# Patient Record
Sex: Female | Born: 1970 | Race: Black or African American | Hispanic: No | Marital: Single | Smoking: Never smoker
Health system: Southern US, Community
[De-identification: ages and names within clinical notes are randomized; demographics above are authoritative.]

## PROBLEM LIST (undated history)

## (undated) DIAGNOSIS — F32A Depression, unspecified: Secondary | ICD-10-CM

## (undated) DIAGNOSIS — F4541 Pain disorder exclusively related to psychological factors: Secondary | ICD-10-CM

## (undated) DIAGNOSIS — F329 Major depressive disorder, single episode, unspecified: Secondary | ICD-10-CM

## (undated) DIAGNOSIS — R739 Hyperglycemia, unspecified: Secondary | ICD-10-CM

## (undated) DIAGNOSIS — M797 Fibromyalgia: Secondary | ICD-10-CM

## (undated) DIAGNOSIS — L708 Other acne: Secondary | ICD-10-CM

## (undated) DIAGNOSIS — E669 Obesity, unspecified: Secondary | ICD-10-CM

## (undated) DIAGNOSIS — E559 Vitamin D deficiency, unspecified: Secondary | ICD-10-CM

## (undated) DIAGNOSIS — R5383 Other fatigue: Secondary | ICD-10-CM

## (undated) DIAGNOSIS — M545 Low back pain: Secondary | ICD-10-CM

## (undated) DIAGNOSIS — R21 Rash and other nonspecific skin eruption: Secondary | ICD-10-CM

## (undated) DIAGNOSIS — R51 Headache: Secondary | ICD-10-CM

## (undated) DIAGNOSIS — M549 Dorsalgia, unspecified: Secondary | ICD-10-CM

## (undated) DIAGNOSIS — R102 Pelvic and perineal pain: Secondary | ICD-10-CM

## (undated) DIAGNOSIS — K2970 Gastritis, unspecified, without bleeding: Secondary | ICD-10-CM

## (undated) DIAGNOSIS — R5381 Other malaise: Secondary | ICD-10-CM

## (undated) DIAGNOSIS — L84 Corns and callosities: Secondary | ICD-10-CM

## (undated) HISTORY — DX: Gastritis, unspecified, without bleeding: K29.70

## (undated) HISTORY — DX: Other acne: L70.8

## (undated) HISTORY — DX: Other malaise: R53.81

## (undated) HISTORY — DX: Other fatigue: R53.83

## (undated) HISTORY — DX: Pain disorder exclusively related to psychological factors: F45.41

## (undated) HISTORY — DX: Vitamin D deficiency, unspecified: E55.9

## (undated) HISTORY — DX: Fibromyalgia: M79.7

## (undated) HISTORY — DX: Hyperglycemia, unspecified: R73.9

## (undated) HISTORY — DX: Major depressive disorder, single episode, unspecified: F32.9

## (undated) HISTORY — DX: Depression, unspecified: F32.A

## (undated) HISTORY — PX: BREAST BIOPSY: SHX20

## (undated) HISTORY — PX: FOOT SURGERY: SHX648

## (undated) HISTORY — DX: Rash and other nonspecific skin eruption: R21

## (undated) HISTORY — PX: APPENDECTOMY: SHX54

## (undated) HISTORY — PX: THYROIDECTOMY, PARTIAL: SHX18

## (undated) HISTORY — DX: Low back pain: M54.5

## (undated) HISTORY — DX: Corns and callosities: L84

## (undated) HISTORY — DX: Headache: R51

## (undated) HISTORY — DX: Pelvic and perineal pain: R10.2

## (undated) HISTORY — DX: Dorsalgia, unspecified: M54.9

## (undated) HISTORY — DX: Obesity, unspecified: E66.9

---

## 2007-05-04 ENCOUNTER — Emergency Department (HOSPITAL_COMMUNITY): Admission: EM | Admit: 2007-05-04 | Discharge: 2007-05-04 | Payer: Self-pay | Admitting: Emergency Medicine

## 2007-05-18 ENCOUNTER — Encounter (INDEPENDENT_AMBULATORY_CARE_PROVIDER_SITE_OTHER): Payer: Self-pay | Admitting: Nurse Practitioner

## 2007-05-18 LAB — CONVERTED CEMR LAB
Bilirubin Urine: NEGATIVE
Chlamydia, DNA Probe: NEGATIVE
HCT: 36.7 %
Hemoglobin: 12.3 g/dL
MCHC: 33.6 g/dL
MCV: 87.3 fL
Protein, ur: NEGATIVE mg/dL
RDW: 13.8 %
pH: 5

## 2007-05-19 ENCOUNTER — Encounter (INDEPENDENT_AMBULATORY_CARE_PROVIDER_SITE_OTHER): Payer: Self-pay | Admitting: Nurse Practitioner

## 2007-05-19 LAB — CONVERTED CEMR LAB
BUN: 14 mg/dL
CO2: 26 meq/L
Calcium: 9 mg/dL
Chloride: 105 meq/L
Creatinine, Ser: 0.48 mg/dL
Sodium: 141 meq/L

## 2007-05-30 ENCOUNTER — Emergency Department (HOSPITAL_COMMUNITY): Admission: EM | Admit: 2007-05-30 | Discharge: 2007-05-30 | Payer: Self-pay | Admitting: Family Medicine

## 2007-05-30 DIAGNOSIS — Z8719 Personal history of other diseases of the digestive system: Secondary | ICD-10-CM | POA: Insufficient documentation

## 2007-07-18 ENCOUNTER — Ambulatory Visit: Payer: Self-pay | Admitting: Nurse Practitioner

## 2007-07-18 DIAGNOSIS — M545 Low back pain, unspecified: Secondary | ICD-10-CM | POA: Insufficient documentation

## 2007-07-18 HISTORY — DX: Low back pain, unspecified: M54.50

## 2007-07-18 LAB — CONVERTED CEMR LAB
Bilirubin Urine: NEGATIVE
Blood in Urine, dipstick: NEGATIVE
KOH Prep: NEGATIVE
pH: 8

## 2007-07-19 ENCOUNTER — Ambulatory Visit (HOSPITAL_COMMUNITY): Admission: RE | Admit: 2007-07-19 | Discharge: 2007-07-19 | Payer: Self-pay | Admitting: Nurse Practitioner

## 2007-07-20 ENCOUNTER — Encounter (INDEPENDENT_AMBULATORY_CARE_PROVIDER_SITE_OTHER): Payer: Self-pay | Admitting: Nurse Practitioner

## 2007-07-26 ENCOUNTER — Encounter (INDEPENDENT_AMBULATORY_CARE_PROVIDER_SITE_OTHER): Payer: Self-pay | Admitting: Nurse Practitioner

## 2007-08-08 ENCOUNTER — Ambulatory Visit: Payer: Self-pay | Admitting: Nurse Practitioner

## 2007-08-08 ENCOUNTER — Ambulatory Visit (HOSPITAL_COMMUNITY): Admission: RE | Admit: 2007-08-08 | Discharge: 2007-08-08 | Payer: Self-pay | Admitting: Nurse Practitioner

## 2007-08-08 DIAGNOSIS — L84 Corns and callosities: Secondary | ICD-10-CM

## 2007-08-08 DIAGNOSIS — M79609 Pain in unspecified limb: Secondary | ICD-10-CM | POA: Insufficient documentation

## 2007-08-08 HISTORY — DX: Corns and callosities: L84

## 2007-08-22 ENCOUNTER — Ambulatory Visit: Payer: Self-pay | Admitting: Nurse Practitioner

## 2007-08-22 DIAGNOSIS — R51 Headache: Secondary | ICD-10-CM

## 2007-08-22 DIAGNOSIS — M7731 Calcaneal spur, right foot: Secondary | ICD-10-CM | POA: Insufficient documentation

## 2007-08-22 DIAGNOSIS — R519 Headache, unspecified: Secondary | ICD-10-CM | POA: Insufficient documentation

## 2007-08-22 DIAGNOSIS — N644 Mastodynia: Secondary | ICD-10-CM

## 2007-08-22 HISTORY — DX: Headache: R51

## 2007-08-22 LAB — CONVERTED CEMR LAB
AST: 14 units/L (ref 0–37)
BUN: 9 mg/dL (ref 6–23)
Basophils Absolute: 0 10*3/uL (ref 0.0–0.1)
Basophils Relative: 0 % (ref 0–1)
Bilirubin Urine: NEGATIVE
Blood in Urine, dipstick: NEGATIVE
CO2: 25 meq/L (ref 19–32)
Glucose, Urine, Semiquant: NEGATIVE
Ketones, urine, test strip: NEGATIVE
Lymphs Abs: 1.5 10*3/uL (ref 0.7–4.0)
MCHC: 30.8 g/dL (ref 30.0–36.0)
MCV: 90.9 fL (ref 78.0–100.0)
Monocytes Relative: 5 % (ref 3–12)
Neutro Abs: 2.8 10*3/uL (ref 1.7–7.7)
Neutrophils Relative %: 60 % (ref 43–77)
Platelets: 328 10*3/uL (ref 150–400)
Potassium: 4.7 meq/L (ref 3.5–5.3)

## 2007-09-15 ENCOUNTER — Encounter: Admission: RE | Admit: 2007-09-15 | Discharge: 2007-11-14 | Payer: Self-pay | Admitting: Nurse Practitioner

## 2007-09-21 ENCOUNTER — Ambulatory Visit: Payer: Self-pay | Admitting: Nurse Practitioner

## 2007-09-21 DIAGNOSIS — R21 Rash and other nonspecific skin eruption: Secondary | ICD-10-CM

## 2007-09-21 HISTORY — DX: Rash and other nonspecific skin eruption: R21

## 2007-09-22 ENCOUNTER — Encounter (INDEPENDENT_AMBULATORY_CARE_PROVIDER_SITE_OTHER): Payer: Self-pay | Admitting: Nurse Practitioner

## 2007-09-27 ENCOUNTER — Encounter (INDEPENDENT_AMBULATORY_CARE_PROVIDER_SITE_OTHER): Payer: Self-pay | Admitting: Nurse Practitioner

## 2007-10-03 ENCOUNTER — Encounter (INDEPENDENT_AMBULATORY_CARE_PROVIDER_SITE_OTHER): Payer: Self-pay | Admitting: Nurse Practitioner

## 2007-10-14 ENCOUNTER — Encounter (INDEPENDENT_AMBULATORY_CARE_PROVIDER_SITE_OTHER): Payer: Self-pay | Admitting: Nurse Practitioner

## 2007-11-01 ENCOUNTER — Ambulatory Visit: Payer: Self-pay | Admitting: Nurse Practitioner

## 2007-11-07 ENCOUNTER — Encounter: Admission: RE | Admit: 2007-11-07 | Discharge: 2007-11-07 | Payer: Self-pay | Admitting: Family Medicine

## 2007-11-10 ENCOUNTER — Encounter (INDEPENDENT_AMBULATORY_CARE_PROVIDER_SITE_OTHER): Payer: Self-pay | Admitting: Nurse Practitioner

## 2007-11-10 ENCOUNTER — Encounter: Admission: RE | Admit: 2007-11-10 | Discharge: 2007-11-10 | Payer: Self-pay | Admitting: Family Medicine

## 2007-11-10 ENCOUNTER — Encounter (INDEPENDENT_AMBULATORY_CARE_PROVIDER_SITE_OTHER): Payer: Self-pay | Admitting: Diagnostic Radiology

## 2007-11-15 ENCOUNTER — Encounter (INDEPENDENT_AMBULATORY_CARE_PROVIDER_SITE_OTHER): Payer: Self-pay | Admitting: Nurse Practitioner

## 2007-11-22 ENCOUNTER — Telehealth (INDEPENDENT_AMBULATORY_CARE_PROVIDER_SITE_OTHER): Payer: Self-pay | Admitting: Nurse Practitioner

## 2007-11-23 ENCOUNTER — Encounter (INDEPENDENT_AMBULATORY_CARE_PROVIDER_SITE_OTHER): Payer: Self-pay | Admitting: Nurse Practitioner

## 2007-11-24 ENCOUNTER — Encounter (INDEPENDENT_AMBULATORY_CARE_PROVIDER_SITE_OTHER): Payer: Self-pay | Admitting: Nurse Practitioner

## 2008-01-11 ENCOUNTER — Encounter (INDEPENDENT_AMBULATORY_CARE_PROVIDER_SITE_OTHER): Payer: Self-pay | Admitting: Nurse Practitioner

## 2008-02-20 ENCOUNTER — Emergency Department (HOSPITAL_COMMUNITY): Admission: EM | Admit: 2008-02-20 | Discharge: 2008-02-20 | Payer: Self-pay | Admitting: Emergency Medicine

## 2008-03-13 ENCOUNTER — Emergency Department (HOSPITAL_COMMUNITY): Admission: EM | Admit: 2008-03-13 | Discharge: 2008-03-13 | Payer: Self-pay | Admitting: Family Medicine

## 2008-04-18 ENCOUNTER — Ambulatory Visit: Payer: Self-pay | Admitting: Nurse Practitioner

## 2008-04-18 DIAGNOSIS — L708 Other acne: Secondary | ICD-10-CM

## 2008-04-18 DIAGNOSIS — R5381 Other malaise: Secondary | ICD-10-CM

## 2008-04-18 DIAGNOSIS — R5383 Other fatigue: Secondary | ICD-10-CM

## 2008-04-18 HISTORY — DX: Other acne: L70.8

## 2008-04-18 HISTORY — DX: Other malaise: R53.81

## 2008-04-19 ENCOUNTER — Emergency Department (HOSPITAL_COMMUNITY): Admission: EM | Admit: 2008-04-19 | Discharge: 2008-04-20 | Payer: Self-pay | Admitting: Emergency Medicine

## 2008-04-19 DIAGNOSIS — D649 Anemia, unspecified: Secondary | ICD-10-CM

## 2008-04-19 LAB — CONVERTED CEMR LAB
ALT: 13 units/L (ref 0–35)
AST: 16 units/L (ref 0–37)
Albumin: 4.1 g/dL (ref 3.5–5.2)
BUN: 9 mg/dL (ref 6–23)
Basophils Relative: 0 % (ref 0–1)
Eosinophils Absolute: 0.1 10*3/uL (ref 0.0–0.7)
Eosinophils Relative: 1 % (ref 0–5)
HCT: 35.9 % — ABNORMAL LOW (ref 36.0–46.0)
Lymphs Abs: 1.5 10*3/uL (ref 0.7–4.0)
MCHC: 32.9 g/dL (ref 30.0–36.0)
MCV: 87.3 fL (ref 78.0–100.0)
Neutrophils Relative %: 60 % (ref 43–77)
Platelets: 282 10*3/uL (ref 150–400)
Sodium: 142 meq/L (ref 135–145)
Total Protein: 7.1 g/dL (ref 6.0–8.3)
Vit D, 25-Hydroxy: 28 ng/mL — ABNORMAL LOW (ref 30–89)
WBC: 4.8 10*3/uL (ref 4.0–10.5)

## 2008-04-30 ENCOUNTER — Encounter (INDEPENDENT_AMBULATORY_CARE_PROVIDER_SITE_OTHER): Payer: Self-pay | Admitting: Nurse Practitioner

## 2008-06-07 ENCOUNTER — Ambulatory Visit: Payer: Self-pay | Admitting: Nurse Practitioner

## 2008-06-07 DIAGNOSIS — E669 Obesity, unspecified: Secondary | ICD-10-CM

## 2008-06-11 LAB — CONVERTED CEMR LAB
Basophils Absolute: 0 10*3/uL (ref 0.0–0.1)
Basophils Relative: 0 % (ref 0–1)
Eosinophils Absolute: 0.1 10*3/uL (ref 0.0–0.7)
Eosinophils Relative: 2 % (ref 0–5)
MCHC: 32.8 g/dL (ref 30.0–36.0)
Monocytes Absolute: 0.4 10*3/uL (ref 0.1–1.0)
Platelets: 269 10*3/uL (ref 150–400)
RBC: 3.92 M/uL (ref 3.87–5.11)
WBC: 5.2 10*3/uL (ref 4.0–10.5)

## 2008-07-19 ENCOUNTER — Encounter: Admission: RE | Admit: 2008-07-19 | Discharge: 2008-08-31 | Payer: Self-pay | Admitting: Orthopedic Surgery

## 2008-10-01 ENCOUNTER — Ambulatory Visit: Payer: Self-pay | Admitting: Nurse Practitioner

## 2008-10-01 ENCOUNTER — Encounter (INDEPENDENT_AMBULATORY_CARE_PROVIDER_SITE_OTHER): Payer: Self-pay | Admitting: Nurse Practitioner

## 2008-10-01 ENCOUNTER — Other Ambulatory Visit: Admission: RE | Admit: 2008-10-01 | Discharge: 2008-10-01 | Payer: Self-pay | Admitting: Internal Medicine

## 2008-10-01 LAB — CONVERTED CEMR LAB
Blood in Urine, dipstick: NEGATIVE
Chlamydia, DNA Probe: NEGATIVE
GC Probe Amp, Genital: NEGATIVE
Glucose, Urine, Semiquant: NEGATIVE
Ketones, urine, test strip: NEGATIVE
Specific Gravity, Urine: <1.005
Urobilinogen, UA: 0.2

## 2008-10-03 ENCOUNTER — Ambulatory Visit: Payer: Self-pay | Admitting: Nurse Practitioner

## 2008-10-03 LAB — CONVERTED CEMR LAB
TSH: 1.47 microintl units/mL (ref 0.350–4.500)
Total CHOL/HDL Ratio: 4
Triglycerides: 144 mg/dL (ref <150)
VLDL: 29 mg/dL (ref 0–40)

## 2008-10-04 ENCOUNTER — Encounter (INDEPENDENT_AMBULATORY_CARE_PROVIDER_SITE_OTHER): Payer: Self-pay | Admitting: Nurse Practitioner

## 2008-11-06 ENCOUNTER — Encounter (INDEPENDENT_AMBULATORY_CARE_PROVIDER_SITE_OTHER): Payer: Self-pay | Admitting: Nurse Practitioner

## 2008-11-08 ENCOUNTER — Encounter (INDEPENDENT_AMBULATORY_CARE_PROVIDER_SITE_OTHER): Payer: Self-pay | Admitting: Nurse Practitioner

## 2008-12-10 ENCOUNTER — Ambulatory Visit: Payer: Self-pay | Admitting: Nurse Practitioner

## 2009-02-14 ENCOUNTER — Ambulatory Visit: Payer: Self-pay | Admitting: Nurse Practitioner

## 2009-02-15 DIAGNOSIS — E559 Vitamin D deficiency, unspecified: Secondary | ICD-10-CM

## 2009-02-15 LAB — CONVERTED CEMR LAB
HCT: 37.4 % (ref 36.0–46.0)
Retic Ct Pct: 0.7 % (ref 0.4–3.1)

## 2009-03-11 ENCOUNTER — Emergency Department (HOSPITAL_COMMUNITY): Admission: EM | Admit: 2009-03-11 | Discharge: 2009-03-11 | Payer: Self-pay | Admitting: Emergency Medicine

## 2009-04-10 ENCOUNTER — Encounter: Admission: RE | Admit: 2009-04-10 | Discharge: 2009-05-16 | Payer: Self-pay | Admitting: Nurse Practitioner

## 2009-04-12 ENCOUNTER — Encounter (INDEPENDENT_AMBULATORY_CARE_PROVIDER_SITE_OTHER): Payer: Self-pay | Admitting: Nurse Practitioner

## 2009-04-15 ENCOUNTER — Encounter (INDEPENDENT_AMBULATORY_CARE_PROVIDER_SITE_OTHER): Payer: Self-pay | Admitting: Nurse Practitioner

## 2009-04-26 ENCOUNTER — Ambulatory Visit: Payer: Self-pay | Admitting: Nurse Practitioner

## 2009-05-01 LAB — CONVERTED CEMR LAB: Vit D, 25-Hydroxy: 20 ng/mL — ABNORMAL LOW (ref 30–89)

## 2009-10-02 ENCOUNTER — Telehealth (INDEPENDENT_AMBULATORY_CARE_PROVIDER_SITE_OTHER): Payer: Self-pay | Admitting: Nurse Practitioner

## 2009-10-02 ENCOUNTER — Encounter (INDEPENDENT_AMBULATORY_CARE_PROVIDER_SITE_OTHER): Payer: Self-pay | Admitting: Nurse Practitioner

## 2009-10-04 ENCOUNTER — Encounter: Admission: RE | Admit: 2009-10-04 | Discharge: 2009-10-04 | Payer: Self-pay | Admitting: Family Medicine

## 2009-10-04 ENCOUNTER — Encounter (INDEPENDENT_AMBULATORY_CARE_PROVIDER_SITE_OTHER): Payer: Self-pay | Admitting: Nurse Practitioner

## 2009-10-08 ENCOUNTER — Encounter (INDEPENDENT_AMBULATORY_CARE_PROVIDER_SITE_OTHER): Payer: Self-pay | Admitting: Nurse Practitioner

## 2010-01-06 ENCOUNTER — Encounter
Admission: RE | Admit: 2010-01-06 | Discharge: 2010-01-06 | Payer: Self-pay | Source: Home / Self Care | Attending: Neurology | Admitting: Neurology

## 2010-02-09 ENCOUNTER — Encounter: Payer: Self-pay | Admitting: Family Medicine

## 2010-02-18 NOTE — Miscellaneous (Signed)
Summary: Rehab Report//INITIAL SUMMARY  Rehab Report//INITIAL SUMMARY   Imported By: Arta Bruce 06/18/2009 15:28:45  _____________________________________________________________________  External Attachment:    Type:   Image     Comment:   External Document

## 2010-02-18 NOTE — Miscellaneous (Signed)
Summary: Mammogram Results  Clinical Lists Changes  Observations: Added new observation of MAMMO DUE: 10/2010 (10/08/2009 13:06) Added new observation of MAMMRECACT: Screening mammogram in 1 year.    (10/04/2009 13:07) Added new observation of MAMMOGRAM: No specific mammographic evidence of malignancy.  Assessment: BIRADS 1.  (10/04/2009 13:07)      Mammogram  Procedure date:  10/04/2009  Findings:      No specific mammographic evidence of malignancy.  Assessment: BIRADS 1.   Comments:      Screening mammogram in 1 year.     Procedures Next Due Date:    Mammogram: 10/2010   Mammogram  Procedure date:  10/04/2009  Findings:      No specific mammographic evidence of malignancy.  Assessment: BIRADS 1.   Comments:      Screening mammogram in 1 year.     Procedures Next Due Date:    Mammogram: 10/2010

## 2010-02-18 NOTE — Letter (Signed)
Summary: TEST ORDER FORM//MAMMOGRAM//APPT DATE & TIME  TEST ORDER FORM//MAMMOGRAM//APPT DATE & TIME   Imported By: Arta Bruce 10/03/2009 10:00:24  _____________________________________________________________________  External Attachment:    Type:   Image     Comment:   External Document

## 2010-02-18 NOTE — Assessment & Plan Note (Signed)
Summary: Back pain/Fatigue   Vital Signs:  Patient profile:   40 year old female Weight:      187.1 pounds Temp:     98.2 degrees F oral Pulse rate:   84 / minute Pulse rhythm:   regular Resp:     18 per minute BP sitting:   128 / 65  (left arm) Cuff size:   regular  Vitals Entered By: Geanie Cooley  (February 14, 2009 2:51 PM) CC: Pt states that she has been having  lower back pain and pt states she has been tired alot. Pt states that even when she wakes up from a nights sleep, she is still tired. Pt states  she has a headache allday it bere goes away., Back Pain Is Patient Diabetic? No Pain Assessment Patient in pain? yes     Location: back Intensity: 10 Type: sharp  Does patient need assistance? Functional Status Self care Ambulation Normal   CC:  Pt states that she has been having  lower back pain and pt states she has been tired alot. Pt states that even when she wakes up from a nights sleep, she is still tired. Pt states  she has a headache allday it bere goes away., and Back Pain.  History of Present Illness:  Pt into the office with complaints of headaches. Most importantly is that she feels fatigue all the time. Gets adequate sleep at night.  When she wakes during the day she feels tired.   Social - Pt has 4 children. She reports that 1 of them is special needs. Pt is a single parent  Back Pain History:      The pain is located in the lower back region and does not radiate below the knees.  She states that she has had a prior history of back pain.  The patient has had a recent course of physical therapy.        Other comments:  Low back pain - recurrent. Unable to stand for extended periods of time.  When cooking or cleaning in the house she has about a 10 minutes maximum time limit then she has to sit. Physical therapy was helpful to pt but ended about 3 months.     Allergies (verified): No Known Drug Allergies  Review of Systems CV:  Denies chest pain  or discomfort. Resp:  Denies cough. GI:  Denies abdominal pain, nausea, and vomiting.  Physical Exam  General:  alert.   Head:  normocephalic.   Lungs:  normal breath sounds.   Heart:  normal rate and regular rhythm.   Abdomen:  soft, non-tender, and normal bowel sounds.   Msk:  up to the exam table Neurologic:  alert & oriented X3.   Psych:  Oriented X3.     Impression & Recommendations:  Problem # 1:  FATIGUE (ICD-780.79) will check labs today Orders: T-Vitamin D (25-Hydroxy) ([id number])  Problem # 2:  LUMBAGO (ICD-724.2) advised pt that she will need to start water aerobics back pain is never going to completely resolve. pt will just have to tolerate it and use conservative measures Her updated medication list for this problem includes:    Arthrotec 75 75-200 Mg-mcg Tabs (Diclofenac-misoprostol) ..... One tablet by mouth two times a day  Complete Medication List: 1)  Triamcinolone Acetonide 0.1 % Lotn (Triamcinolone acetonide) .... Apply to body topically twice daily 2)  Celexa 20 Mg Tabs (Citalopram hydrobromide) .... One tablet by mouth daily 3)  Multivitamins Caps (Multiple vitamin) .Marland KitchenMarland KitchenMarland Kitchen  One tablet by mouth daily 4)  Ferrous Sulfate 325 (65 Fe) Mg Tbec (Ferrous sulfate) .Marland Kitchen.. 1 tablet by mouth daily 5)  Benzamycin 5-3 % Gel (Benzoyl peroxide-erythromycin) .... Apply to face two times a day 6)  Arthrotec 75 75-200 Mg-mcg Tabs (Diclofenac-misoprostol) .... One tablet by mouth two times a day 7)  Gabapentin 300 Mg Caps (Gabapentin) .... One capsule by mouth nightly for numbness  Other Orders: T- * Misc. Laboratory test [phone])  Patient Instructions: 1)  Check into joining the [employer] to do water aerobics. 2)  This will help you back. 3)  Also you should get a heating pad.  Buy from [employer] to use at night when back is hurting 4)  Labs will be done to find out why you are so tired. 5)  You will be informed of the results Prescriptions: ARTHROTEC 75 75-200 MG-MCG TABS  (DICLOFENAC-MISOPROSTOL) One tablet by mouth two times a day  #60 x 3   Entered and Authorized by:   Lehman Prom FNP   Signed by:   Lehman Prom FNP on 02/14/2009   Method used:   Print then Give to Patient   RxID:   [id number]

## 2010-02-18 NOTE — Miscellaneous (Signed)
Summary: Rehab Report/DISCHARGE SUMMARY  Rehab Report/DISCHARGE SUMMARY   Imported By: Arta Bruce 07/19/2009 15:41:46  _____________________________________________________________________  External Attachment:    Type:   Image     Comment:   External Document

## 2010-02-18 NOTE — Progress Notes (Signed)
Summary: need breast referral  Phone Note Call from Patient Call back at Home Phone [phone]   Reason for Call: Referral Summary of Call: Kristin Joseph. MS Sami CALLED AND SAYS THAT SHE HAS AN APPOINTMENT AT [facility] ON [address] TOMORROW AT 11. THE REASON WHY SHE IS GOING, IS BECAUSE SHE SAYS SHE WENT THERE SOME TIME AGO FOR THEM TO REMOVE SOMETING FRO HER LEFT BREAST AND NOW IT HAS COME BACK AND SHE IS HAVING A LOT OF PAIN IN THE SAME BREAST. THE [facility] IS [facility]KSING IF [facility] WILL SEND OVER A REFERRAL TO THERE OFFICE BEFORE HER APPT. TOMORROW Initial call taken by: Leodis Rains,  October 02, 2009 10:45 AM  Follow-up for Phone Call        LEFT MESSAGE FOR Joseph TO RETURN CALL ASAP> MAY NEED EVALUATION IN OFFICE? Chantel Miller  October 02, 2009 12:44 PM   spoke with Joseph and she is aware that order will be printed and [facility]ed to [facility] her appt is at 11:00 10/03/09 Follow-up by: Levon Hedger,  October 02, 2009 4:13 PM  Additional Follow-up for Phone Call Additional follow up Details #1::        Rx printed and at Nurse's station fax as indicated above Additional Follow-up by: Lehman Prom FNP,  October 02, 2009 5:07 PM

## 2010-03-03 ENCOUNTER — Encounter: Payer: Self-pay | Admitting: Nurse Practitioner

## 2010-03-03 ENCOUNTER — Encounter (INDEPENDENT_AMBULATORY_CARE_PROVIDER_SITE_OTHER): Payer: Self-pay | Admitting: Nurse Practitioner

## 2010-03-04 ENCOUNTER — Encounter (INDEPENDENT_AMBULATORY_CARE_PROVIDER_SITE_OTHER): Payer: Self-pay | Admitting: *Deleted

## 2010-03-12 NOTE — Assessment & Plan Note (Signed)
Summary: Breast Tenderness   Vital Signs:  Patient profile:   40 year old female Height:      66 inches Weight:      193.1 pounds BMI:     31.28 Temp:     96.8 degrees F oral Pulse rhythm:   regular Resp:     18 per minute BP sitting:   106 / 60  (left arm) Cuff size:   regular  Nutrition Counseling: Patient's BMI is greater than 25 and therefore counseled on weight management options. CC: pt has swelling in breast......Kristin Joseph pt says she has pain in left foot and back..., Back Pain Is Patient Diabetic? No Pain Assessment Patient in pain? no       Does patient need assistance? Functional Status Self care Ambulation Normal   CC:  pt has swelling in breast......Kristin Joseph pt says she has pain in left foot and back... and Back Pain.  History of Present Illness:  Pt into the office for breast tenderness Mammogram last done 09/2009 pt with complaints of breast tenderness.  She is worried because the breast are tender the majority of the time.   One cup of tea per day No smoking  reports regular menses - monthly  Back Pain History:      The patient's back pain has been present for > 6 weeks.  She states this is not work related.  She states that she has no prior history of back pain.  The patient has had a recent course of physical therapy.        Other comments:  No trauma to the back.    Critical Exclusionary Diagnosis Criteria (CEDC) for Back Pain:      The patient denies a history of previous trauma.  She has no prior history of spinal surgery.  There are no symptoms to suggest infection, cancer, cauda equina, or psychosocial factors for back pain.  Other positive CEDC factors include low back pain worse with activity.     Allergies: No Known Drug Allergies  Review of Systems General:  +breast tenderness. CV:  Denies chest pain or discomfort. Resp:  Denies cough. GI:  Denies abdominal pain, nausea, and vomiting. MS:  Complains of low back pain and stiffness; Surgery on left  foot in 2010 - still problematic.  Still with pain and swelling in left foot.  Needs f/u for the problems - previously seen by Dr. Ralene Cork.  Physical Exam  General:  alert.   Head:  normocephalic.   Msk:  left foot - visible incision scars at 3rd, 4th metatarsal Neurologic:  alert & oriented X3.     Detailed Back/Spine Exam  General:    obese.    Lumbosacral Exam:  Inspection-deformity:    Normal Palpation-spinal tenderness:  Abnormal    Location:  L4-L5   Impression & Recommendations:  Problem # 1:  BREAST PAIN, BILATERAL (ICD-611.71) mammogram up to date advised pt this is likely hormonal will order anti-inflammatories  avoid caffiene  Problem # 2:  NEED PROPHYLACTIC VACCINATION&INOCULATION FLU (ICD-V04.81) given today in office  Problem # 3:  LUMBAGO (ICD-724.2) pt would like referral to chiropractor will honor referral and give to pt as she has already picked out specialist  Her updated medication list for this problem includes:    Cataflam 50 Mg Tabs (Diclofenac potassium) ..... One tablet by mouth two times a day as needed for pain  Orders: Chiropractic Referral (Chiro)  Complete Medication List: 1)  Cataflam 50 Mg Tabs (Diclofenac potassium) .Kristin KitchenMarland KitchenMarland Joseph  One tablet by mouth two times a day as needed for pain  Other Orders: Flu Vaccine 83yrs + (16109) Admin 1st Vaccine (60454) Podiatry Referral (Podiatry)  Patient Instructions: 1)  Schedule an appointment for fasting labs lipids (278.00), CMET, 780.79, CBC (285.90), TSH (780.79) 2)  You can take the referral to the chiropractor 3)  Breast tenderness - this is likely hormonal.  Your mammogram was ok done September 2011.  Take medications two times a day with food for breast.  This will improve I just don't have an exact date/time that it will improve 4)  Foot pain - you will be referred back to [facility] - you will be informed of the time/date of the appointment 5)  Follow up as  needed Prescriptions: CATAFLAM 50 MG TABS (DICLOFENAC POTASSIUM) One tablet by mouth two times a day as needed for pain  #60 x 1   Entered and Authorized by:   Lehman Prom FNP   Signed by:   Lehman Prom FNP on 03/03/2010   Method used:   Print then Give to Patient   RxID:   [id number]    Orders Added: 1)  Flu Vaccine 31yrs + [30865] 2)  Admin 1st Vaccine [90471] 3)  Est. Patient Level III [78469] 4)  Chiropractic Referral [Chiro] 5)  Podiatry Referral [Podiatry]   Immunizations Administered:  Influenza Vaccine # 1:    Vaccine Type: Fluvax 3+    Site: right deltoid    Mfr: GlaxoSmithKline    Dose: 0.5 ml    Route: IM    Given by: [place] Shannon    Exp. Date: 06/31/2012    Lot #: [id number]    VIS given: 08/12/06 version given March 03, 2010.  Flu Vaccine Consent Questions:    Do you have a history of severe allergic reactions to this vaccine? no    Any prior history of allergic reactions to egg and/or gelatin? no    Do you have a sensitivity to the preservative Thimersol? no    Do you have a past history of Guillan-Barre Syndrome? no    Do you currently have an acute febrile illness? no    Have you ever had a severe reaction to latex? no    Vaccine information given and explained to patient? yes    Are you currently pregnant? no   Immunizations Administered:  Influenza Vaccine # 1:    Vaccine Type: Fluvax 3+    Site: right deltoid    Mfr: GlaxoSmithKline    Dose: 0.5 ml    Route: IM    Given by: [place] Shannon    Exp. Date: 06/31/2012    Lot #: [id number]    VIS given: 08/12/06 version given March 03, 2010.

## 2010-03-12 NOTE — Letter (Signed)
Summary: *HSN Results Follow up  [facility]  [address] [place], [place] [address]   Phone: [phone]  Fax: [phone]      03/04/2010   Kristin Joseph [address] [place], [place]  [address]   Dear  Ms. Kristin Joseph,                             Comments: I BEEN TRYING TO REACH YOU BY PHONE [phone] AND I LEAVE YOU A MESSAGE .YOU HAVE AN APPT [facility]   03-11-10 @ 9AM PH# [phone] [address] Kristin Joseph FOR YOU FOOT PAIN  THANK YOU        _________________________________________________________ If you have any questions, please contact our office                     Sincerely,  Cheryll Dessert [facility]

## 2010-03-12 NOTE — Miscellaneous (Signed)
Summary: Podiatry referral  Phone Note Outgoing Call   Summary of Call: Refer pt to [facility] she was previously seen by Dr. Ralene Cork for foot surgery in 2010 - she is still having problems with the foot and needs re-evaluation Initial call taken by: Lehman Prom FNP,  March 03, 2010 11:51 AM  Follow-up for Phone Call        PT HAS AN APPT 03-11-10 @ 9AM  LVM TO PT TO CALL ME BACK .Marland KitchenCheryll Dessert  March 03, 2010 12:26 PM     Clinical Lists Changes

## 2010-03-13 ENCOUNTER — Telehealth (INDEPENDENT_AMBULATORY_CARE_PROVIDER_SITE_OTHER): Payer: Self-pay | Admitting: Nurse Practitioner

## 2010-03-18 NOTE — Progress Notes (Signed)
Summary: NOS TO PODIATRY APPT   Phone Note Outgoing Call   Summary of Call: I just want to let you know that Kristin Joseph NO SHOW TO HER APPT 03-11-10 @ 9AM  I LVM 03-03-10  and I mailed a letter with her appt 03-04-10 and I call today n/a . Initial call taken by: Cheryll Dessert,  March 13, 2010 9:04 AM  Follow-up for Phone Call        noted Follow-up by: Lehman Prom FNP,  March 13, 2010 9:33 AM

## 2010-03-28 ENCOUNTER — Encounter (INDEPENDENT_AMBULATORY_CARE_PROVIDER_SITE_OTHER): Payer: Self-pay | Admitting: Nurse Practitioner

## 2010-03-28 LAB — CONVERTED CEMR LAB
Basophils Absolute: 0 10*3/uL (ref 0.0–0.1)
Basophils Relative: 0 % (ref 0–1)
Calcium: 9.1 mg/dL (ref 8.4–10.5)
Cholesterol: 163 mg/dL (ref 0–200)
Creatinine, Ser: 0.54 mg/dL (ref 0.40–1.20)
Eosinophils Absolute: 0 10*3/uL (ref 0.0–0.7)
Eosinophils Relative: 1 % (ref 0–5)
HCT: 38.3 % (ref 36.0–46.0)
HDL: 36 mg/dL — ABNORMAL LOW (ref >39)
Hemoglobin: 11.8 g/dL — ABNORMAL LOW (ref 12.0–15.0)
LDL Cholesterol: 96 mg/dL (ref 0–99)
Lymphs Abs: 1.4 10*3/uL (ref 0.7–4.0)
MCHC: 30.8 g/dL (ref 30.0–36.0)
MCV: 93.9 fL (ref 78.0–100.0)
Monocytes Relative: 8 % (ref 3–12)
Potassium: 4 meq/L (ref 3.5–5.3)
RBC: 4.08 M/uL (ref 3.87–5.11)
Sodium: 142 meq/L (ref 135–145)
TSH: 0.873 microintl units/mL (ref 0.350–4.500)
Total CHOL/HDL Ratio: 4.5
Triglycerides: 157 mg/dL — ABNORMAL HIGH (ref <150)
VLDL: 31 mg/dL (ref 0–40)

## 2010-03-31 ENCOUNTER — Encounter (INDEPENDENT_AMBULATORY_CARE_PROVIDER_SITE_OTHER): Payer: Self-pay | Admitting: Nurse Practitioner

## 2010-04-08 NOTE — Letter (Signed)
Summary: Lipid Letter  [facility]  [address] [place], [place] [address]   Phone: [phone]  Fax: [phone]    03/31/2010  Kristin Joseph [address] [place], [place]  [address]  Dear Gurney Maxin:  We have carefully reviewed your last lipid profile from 03/28/2010 and the results are noted below with a summary of recommendations for lipid management.    Cholesterol:       163     Goal: less than 200   HDL "good" Cholesterol:   36     Goal: greater than 40   LDL "bad" Cholesterol:   96     Goal: less than 130   Triglycerides:       157     Goal: less than 150    Labs done during recent office visit are ok.  Your triglycerides are just a little elevated but no need for medications. Your white blood cells are a little low but this can be caused from a number of things.  Will recheck blood count at your next visit.    Current Medications: 1)    Cataflam 50 Mg Tabs (Diclofenac potassium) .... One tablet by mouth two times a day as needed for pain  If you have any questions, please call. We appreciate being able to work with you.   Sincerely,    [facility] Lehman Prom FNP

## 2010-04-30 LAB — DIFFERENTIAL
Basophils Absolute: 0 10*3/uL (ref 0.0–0.1)
Basophils Relative: 1 % (ref 0–1)
Monocytes Absolute: 0.4 10*3/uL (ref 0.1–1.0)
Neutro Abs: 2.7 10*3/uL (ref 1.7–7.7)
Neutrophils Relative %: 51 % (ref 43–77)

## 2010-04-30 LAB — CBC
HCT: 35.1 % — ABNORMAL LOW (ref 36.0–46.0)
Hemoglobin: 12.2 g/dL (ref 12.0–15.0)
MCHC: 34.7 g/dL (ref 30.0–36.0)
MCV: 87.7 fL (ref 78.0–100.0)
RBC: 4.01 MIL/uL (ref 3.87–5.11)
RDW: 12.9 % (ref 11.5–15.5)

## 2010-04-30 LAB — BASIC METABOLIC PANEL
BUN: 12 mg/dL (ref 6–23)
CO2: 28 mEq/L (ref 19–32)

## 2010-05-21 ENCOUNTER — Ambulatory Visit: Payer: Medicaid Other | Attending: Orthopedic Surgery | Admitting: Physical Therapy

## 2010-05-21 DIAGNOSIS — IMO0001 Reserved for inherently not codable concepts without codable children: Secondary | ICD-10-CM | POA: Insufficient documentation

## 2010-05-21 DIAGNOSIS — M545 Low back pain, unspecified: Secondary | ICD-10-CM | POA: Insufficient documentation

## 2010-05-21 DIAGNOSIS — M542 Cervicalgia: Secondary | ICD-10-CM | POA: Insufficient documentation

## 2010-05-21 DIAGNOSIS — M2569 Stiffness of other specified joint, not elsewhere classified: Secondary | ICD-10-CM | POA: Insufficient documentation

## 2010-05-24 ENCOUNTER — Ambulatory Visit (INDEPENDENT_AMBULATORY_CARE_PROVIDER_SITE_OTHER): Payer: Medicaid Other

## 2010-05-24 ENCOUNTER — Inpatient Hospital Stay (INDEPENDENT_AMBULATORY_CARE_PROVIDER_SITE_OTHER)
Admission: RE | Admit: 2010-05-24 | Discharge: 2010-05-24 | Disposition: A | Discharge status: Home / Self Care | Payer: Medicaid Other | Source: Ambulatory Visit | Attending: Family Medicine | Admitting: Family Medicine

## 2010-05-24 DIAGNOSIS — M79609 Pain in unspecified limb: Secondary | ICD-10-CM

## 2010-05-27 ENCOUNTER — Ambulatory Visit: Payer: Medicaid Other | Admitting: Physical Therapy

## 2010-06-04 ENCOUNTER — Ambulatory Visit: Payer: Medicaid Other | Admitting: Physical Therapy

## 2010-06-09 ENCOUNTER — Ambulatory Visit: Payer: Medicaid Other | Admitting: Physical Therapy

## 2010-10-02 ENCOUNTER — Other Ambulatory Visit: Payer: Self-pay | Admitting: Internal Medicine

## 2010-10-02 DIAGNOSIS — Z1231 Encounter for screening mammogram for malignant neoplasm of breast: Secondary | ICD-10-CM

## 2010-10-21 ENCOUNTER — Ambulatory Visit
Admission: RE | Admit: 2010-10-21 | Discharge: 2010-10-21 | Disposition: A | Discharge status: Home / Self Care | Payer: Medicaid Other | Source: Ambulatory Visit | Attending: Internal Medicine | Admitting: Internal Medicine

## 2010-10-21 DIAGNOSIS — Z1231 Encounter for screening mammogram for malignant neoplasm of breast: Secondary | ICD-10-CM

## 2010-12-01 IMAGING — CR DG KNEE COMPLETE 4+V*R*
4 series · 4 of 4 positions shown · non-contrast
Comparison: None

CLINICAL DATA: Fell.  Pain.

 RIGHT KNEE - COMPLETE 4+ VIEW

[view not recorded (1 of 4)]
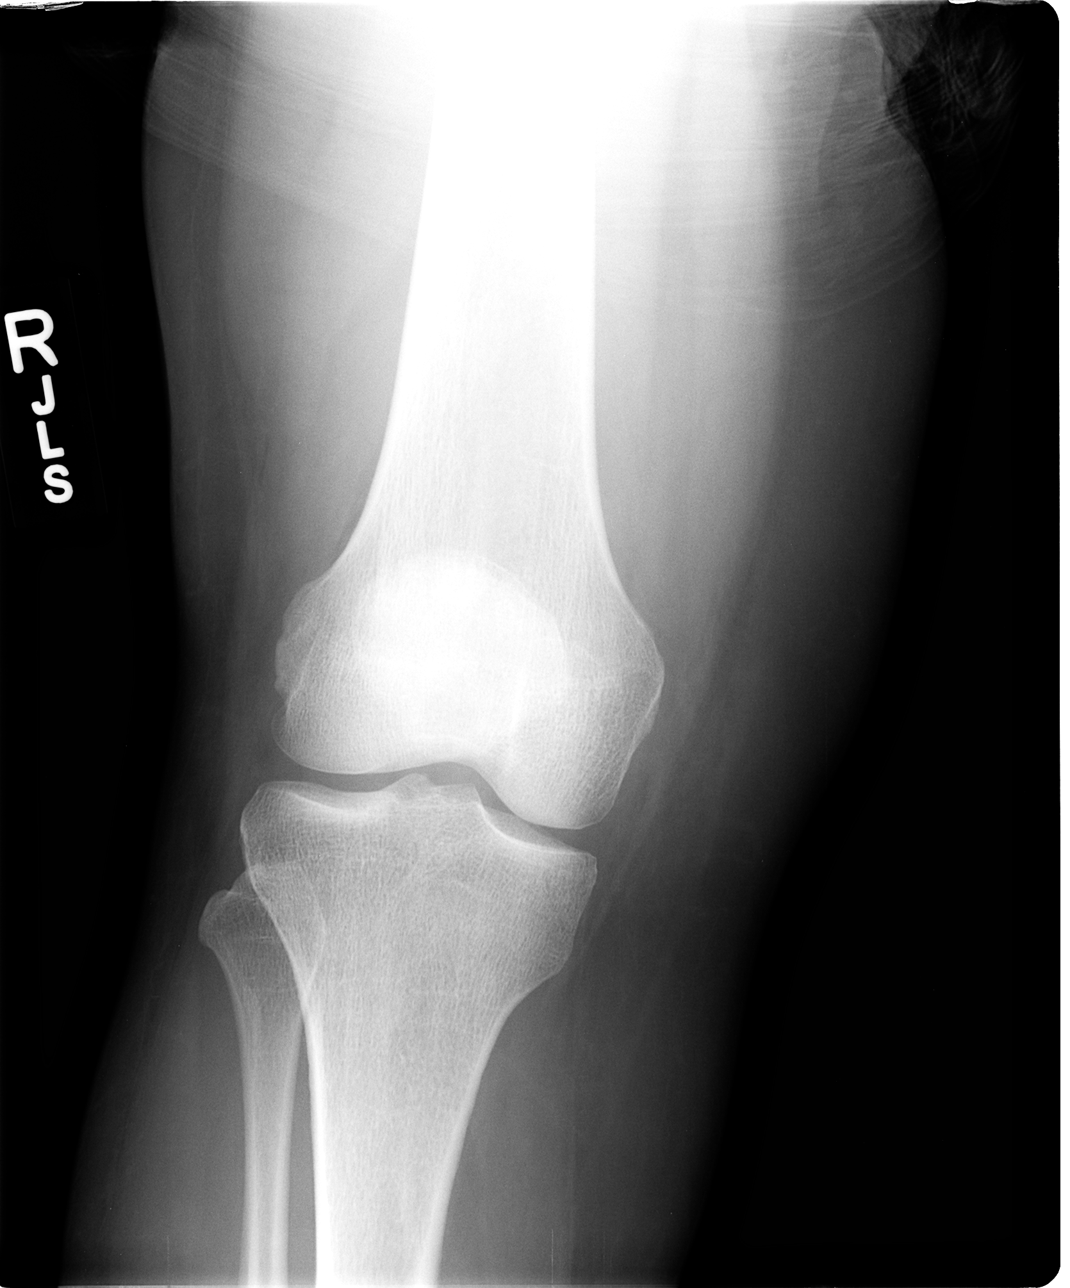

[view not recorded (2 of 4)]
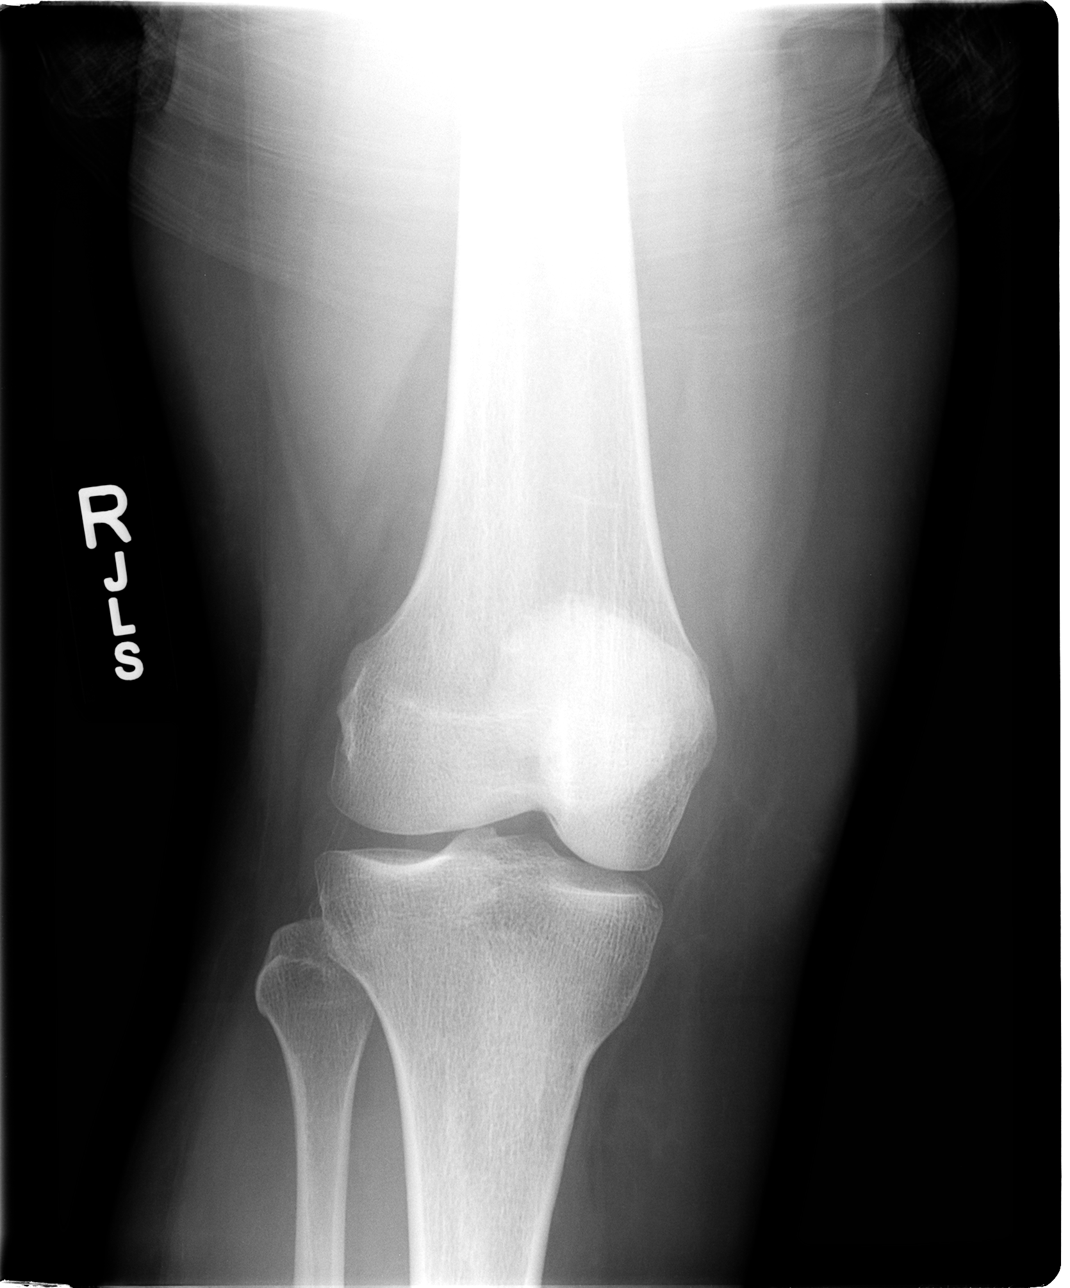

[view not recorded (3 of 4)]
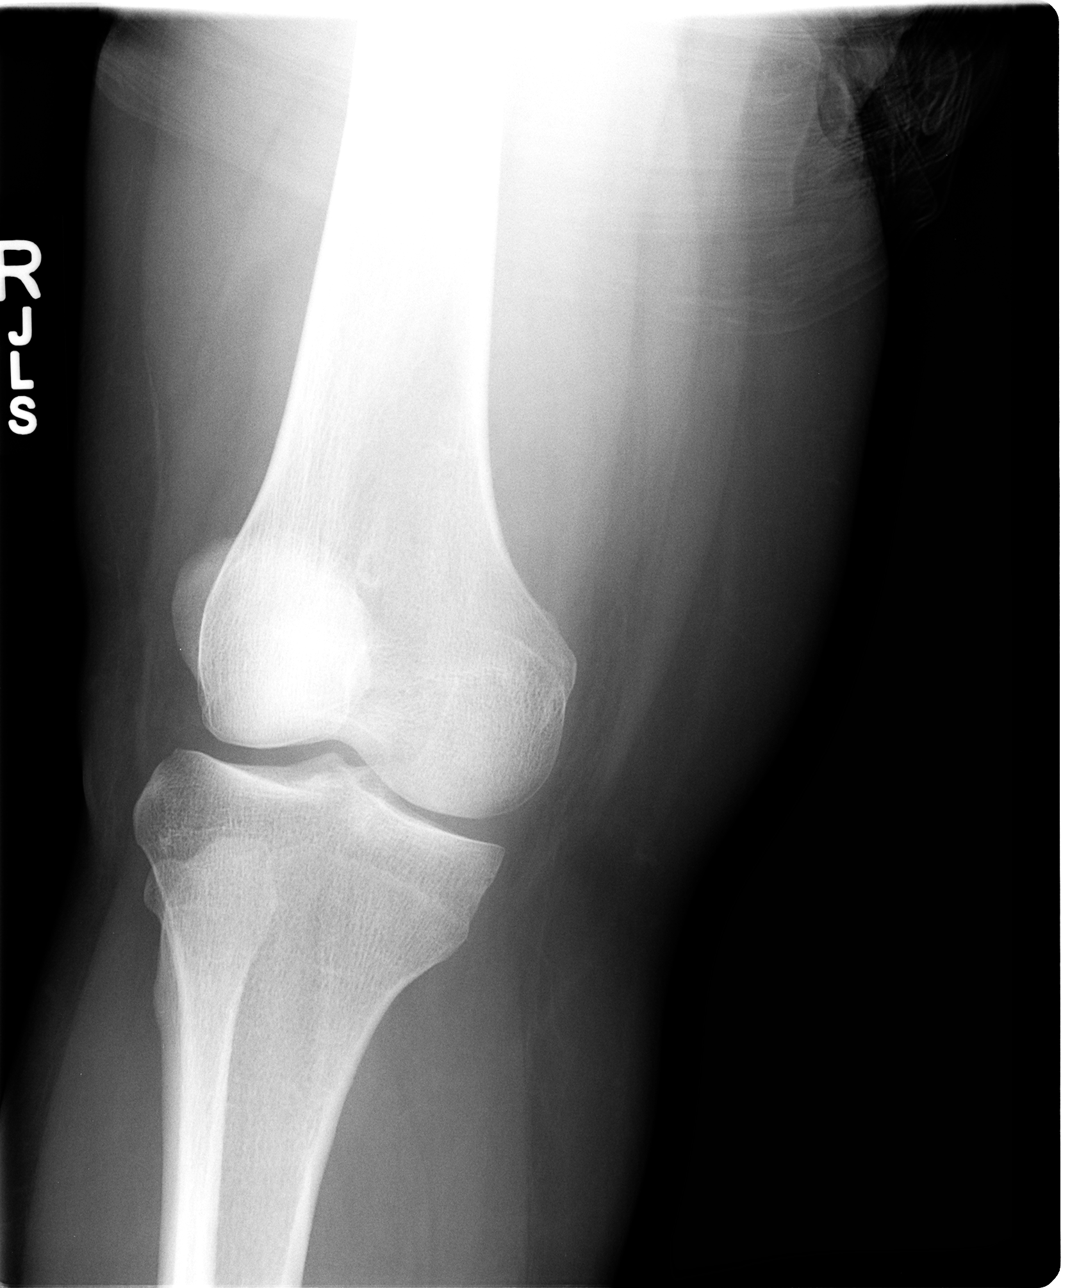

[view not recorded (4 of 4)]
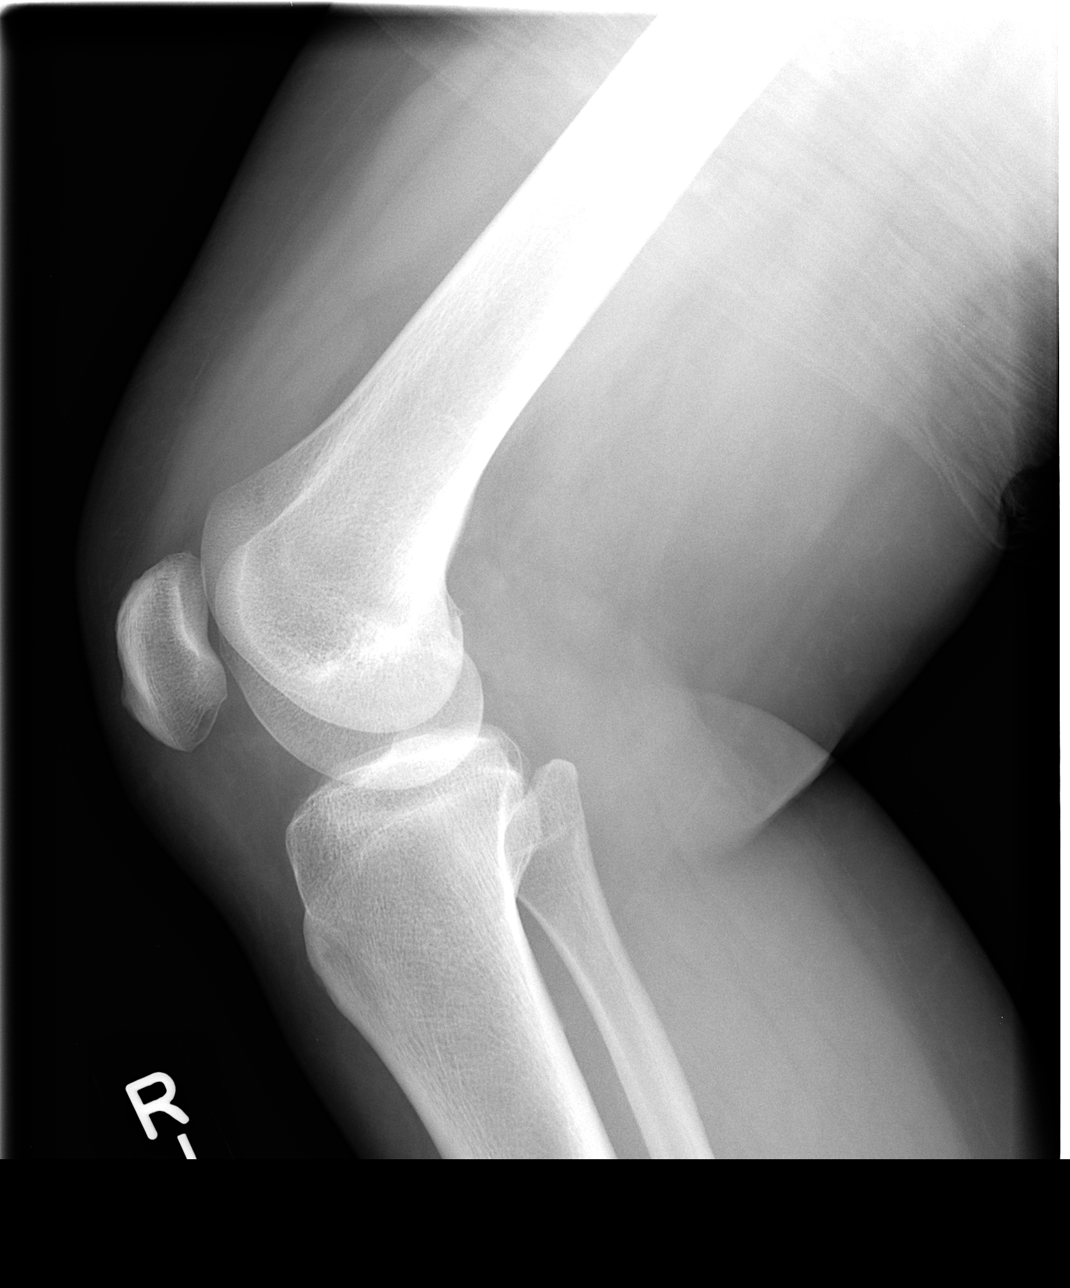

[4 of 4 positions shown; findings below may reference images not displayed]

FINDINGS: There is a moderate sized joint effusion.  No sign of
fracture or dislocation. no focal lesion evident.
IMPRESSION: Joint effusion without osseous or articular pathology evident.

## 2011-03-10 ENCOUNTER — Other Ambulatory Visit: Payer: Self-pay | Admitting: Family Medicine

## 2011-03-10 DIAGNOSIS — N63 Unspecified lump in unspecified breast: Secondary | ICD-10-CM

## 2011-03-19 ENCOUNTER — Ambulatory Visit
Admission: RE | Admit: 2011-03-19 | Discharge: 2011-03-19 | Disposition: A | Discharge status: Home / Self Care | Payer: Medicaid Other | Source: Ambulatory Visit | Attending: Family Medicine | Admitting: Family Medicine

## 2011-03-19 DIAGNOSIS — N63 Unspecified lump in unspecified breast: Secondary | ICD-10-CM

## 2011-05-20 ENCOUNTER — Encounter: Payer: Self-pay | Admitting: Obstetrics & Gynecology

## 2011-05-20 ENCOUNTER — Ambulatory Visit (INDEPENDENT_AMBULATORY_CARE_PROVIDER_SITE_OTHER): Payer: Medicaid Other | Admitting: Obstetrics and Gynecology

## 2011-05-20 ENCOUNTER — Encounter: Payer: Self-pay | Admitting: Obstetrics and Gynecology

## 2011-05-20 VITALS — HR 77 | Temp 97.5°F | Resp 16 | Ht 64.5 in | Wt 195.0 lb

## 2011-05-20 DIAGNOSIS — N915 Oligomenorrhea, unspecified: Secondary | ICD-10-CM

## 2011-05-20 NOTE — Progress Notes (Signed)
Ilham Roughton Zerezgi40 y.o.No obstetric history on file. @Unknown  by LMP Chief Complaint  Patient presents with  . Referral    [facility]- irregular periods     SUBJECTIVE  HPI: 41 year old G4P3 25 African female from [place] furthered from [place] due to oligomenorrhea. Natural history: 16x28x7 with heavy flow associated with back pain. Show irregularity began about a year ago she began having 2-6 month intervals. LMP: 05/15/2011 with five-day normal flow. PMP: 1 /20/2013 with five-day normal flow;  prior menses was in September 2012. She denies symptoms of hypothyroidism. Denies nipple discharge. She does have facial hair above her lip which has been present for many years but denies in other areas. Her mother had menopause at age 98. She had a negative Pap smear 03/10/2011. 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 She has not been sexually active for 5 years and is on no contraception. Her husband now wants another AB and is telling her he will have a baby with another woman if she is on infertile. She does desire to become pregnant.  Past Medical History  Diagnosis Date  . Depression   . Stress headaches    History reviewed. No pertinent past surgical history. History   Social History  . Marital Status: Single    Spouse Name: N/A    Number of Children: N/A  . Years of Education: N/A   Occupational History  . Not on file.   Social History Main Topics  . Smoking status: Never Smoker   . Smokeless tobacco: Not on file  . Alcohol Use: No  . Drug Use: No  . Sexually Active: Not Currently   Other Topics Concern  . Not on file   Social History Narrative  . No narrative on file   Current Outpatient Prescriptions on File Prior to Visit  Medication Sig Dispense Refill  . calcium carbonate (OS-CAL) 600 MG TABS Take 600 mg by mouth 2 (two) times daily with a meal.       No Known  Allergies  ROS: Pertinent items in HPI  OBJECTIVE Pulse 77, temperature 97.5 F (36.4 C), temperature source Oral, resp. rate 16, height 5' 4.5" (1.638 m), weight 195 lb (88.451 kg), last menstrual period 05/15/2011.  GENERAL: Well-developed, well-nourished female in no acute distress.  HEENT: Normocephalic, good dentition HEART: normal rate RESP: normal effort ABDOMEN: Soft, nontender EXTREMITIES: Nontender, no edema NEURO: Alert and oriented SPECULUM EXAM: NEFG, physiologic discharge, no blood noted, cervix clean BIMANUAL: cervix taper S.; uterus NSSP; no adnexal tenderness or masses   ASSESSMENT  41 year-old G4 P 3013 Oligomenorrhea  PLAN FSH, TSH, prolactin level, free and total testosterone sent  Keep menstrual history  Return for lab results in 2 weeks     Jahkari Maclin 05/20/2011 4:04 PM

## 2011-05-20 NOTE — Patient Instructions (Signed)
Menstruation °Menstruation is the monthly passing of blood, tissue, fluid and mucus, also know as a period. Your body is shedding the lining of the uterus. The flow, or amount of blood, usually lasts from 3 to 7 days each month. Hormones control the menstrual cycle. Hormones are a chemical substance produced by endocrine glands in the body to regulate different bodily functions. °The first menstrual period may start any time between age 41 to 16 years. However, it usually starts around age 11 or 12. Some girls have regular monthly menstrual cycles right from the beginning. However, it is not unusual to have only a couple of drops of blood or spotting when you first start menstruating. It is also not unusual to have two periods a month or miss a month or two when first starting your periods. °SYMPTOMS  °· Mild to moderate abdominal cramps.  °· Aching or pain in the lower back area.  °Symptoms that may occur 5 to 10 days before your menstrual period starts, which is referred to as premenstrual syndrome (PMS). These symptoms can include: °· Headache.  °· Breast tenderness and swelling.  °· Bloating.  °· Tiredness (fatigue).  °· Mood changes.  °· Craving for certain foods.  °These are normal signs and symptoms and can vary in severity. To help relieve these problems, ask your caregiver if you can take over-the-counter medications for pain or discomfort. If the symptoms are not controllable, see your caregiver for help.  °HORMONES INVOLVED IN MENSTRUATION °Menstruation comes about because of hormones produced by the pituitary gland in the brain and the ovaries that affect the uterine lining. °First, the pituitary gland in the brain produces the hormone Follicle Stimulating Hormone (FSH). FSH stimulates the ovaries to produce estrogen, which thickens the uterine lining and begins to develop an egg in the ovary. About 14 days later, the pituitary gland produces another hormone called Luteinizing Hormone (LH). LH causes the  egg to come out of a sac in the ovary (ovulation). The empty sac on the ovary called the corpus luteum is stimulated by another hormone from the pituitary gland called luteotropin. The corpus luteum begins to produce the estrogen and progesterone hormone. The progesterone hormone prepares the lining of the uterus to have the fertilized egg (egg and sperm) attach to the lining of the uterus and begin to develop into a fetus. If the egg is not fertilized, the corpus luteum stops producing estrogen and progesterone, it disappears, the lining of the uterus sloughs off and a menstrual period begins. Then the menstrual cycle starts all over again and will continue monthly unless pregnancy occurs or menopause begins. °The secretion of hormones is complex. Various parts of the body become involved in many chemical activities. Female sex hormones have other functions in a woman's body as well. Estrogen increases a woman's sex drive (libido). It naturally helps body get rid of fluids (diuretic). It also aids in the process of building new bone. Therefore, maintaining hormonal health is essential to all levels of a woman's well being. These hormones are usually present in normal amounts and cause you to menstruate. It is the relationship between the (small) levels of the hormones that is critical. When the balance is upset, menstrual irregularities can occur. °HOW DOES THE MENSTRUAL CYCLE HAPPEN? °· Menstrual cycles vary in length from 21 to 35 days with an average of 29 days. The cycle begins on the first day of bleeding. At this time, the pituitary gland in the brain releases FSH that travels   through the bloodstream to the ovaries. The FSH stimulates the follicles in the ovaries. This prepares the body for ovulation that occurs around the 14th day of the cycle. The ovaries produce estrogen, and this makes sure conditions are right in the uterus for implantation of the fertilized egg.  °· When the levels of estrogen reach a  high enough level, it signals the gland in the brain (pituitary gland) to release a surge of LH. This causes the release of the ripest egg from its follicle (ovulation). Usually only one follicle releases one egg, but sometimes more than one follicle releases an egg especially when stimulating the ovaries for invitro fertilization. The egg can then be collected by either fallopian tube to await fertilization. The burst follicle within the ovary that is left behind is now called the corpus luteum or "yellow body." The corpus luteum continues to give off (secrete) reduced amounts of estrogen. This closes and hardens the cervix. It driesup the mucus to the naturally infertile condition.  °· The corpus luteum also begins to give off greater amounts of progesterone. This causes the lining of the uterus (endometrium) to thicken even more in preparation for the fertilized egg. The egg is starting to journey down from the fallopian tube to the uterus. It also signals the ovaries to stop releasing eggs. It assists in returning the cervical mucus to its infertile state.  °· If the egg implants successfully into the womb lining and pregnancy occurs, progesterone levels will continue to raise. It is often this hormone that gives some pregnant women a feeling of well being, like a "natural high." Progesterone levels drop again after childbirth.  °· If fertilization does not occur, the corpus luteum dies, stopping the production of hormones. This sudden drop in progesterone causes the uterine lining to break down, accompanied by blood (menstruation).  °· This starts the cycle back at day 1. The whole process starts all over again. Woman go through this cycle every month from puberty to menopause. Women have breaks only for pregnancy and breastfeeding (lactation), unless the woman has health problems that affect the female hormone system or chooses to use oral contraceptives to have unnatural menstrual periods.  °HOME CARE  INSTRUCTIONS  °· Keep track of your periods by using a calendar.  °· If you use tampons, get the least absorbent to avoid toxic shock syndrome.  °· Do not leave tampons in the vagina over night or longer than 6 hours.  °· Wear a sanitary pad over night.  °· Exercise 3 to 5 times a week or more.  °· Avoid foods and drinks that you know will make your symptoms worse before or during your period.  °SEEK MEDICAL CARE IF:  °· You develop a fever of 100° F (37.8° C) or higher with your period.  °· Your periods are lasting more than 7 days.  °· Your period is so heavy that you have to change pads or tampons every 30 minutes.  °· You develop clots with your period and never had clots before.  °· You cannot get relief from over-the-counter medication for your symptoms.  °· Your period has not started, and it has been longer than 35 days.  °Document Released: 12/26/2001 Document Revised: 12/25/2010 Document Reviewed: 10/21/2007 °[employer]® Patient Information ©2012 [employer]. °

## 2011-05-21 LAB — PROLACTIN: Prolactin: 3.9 ng/mL

## 2011-05-21 LAB — FOLLICLE STIMULATING HORMONE: FSH: 20.1 m[IU]/mL

## 2011-05-28 ENCOUNTER — Encounter: Payer: Self-pay | Admitting: Physician Assistant

## 2011-05-28 ENCOUNTER — Ambulatory Visit (INDEPENDENT_AMBULATORY_CARE_PROVIDER_SITE_OTHER): Payer: Medicaid Other | Admitting: Physician Assistant

## 2011-05-28 DIAGNOSIS — N915 Oligomenorrhea, unspecified: Secondary | ICD-10-CM

## 2011-05-28 NOTE — Patient Instructions (Signed)
Pregnancy  If you are planning on getting pregnant, it is a good idea to make a preconception appointment with your care- giver to discuss having a healthy lifestyle before getting pregnant. Such as, diet, weight, exercise, taking prenatal vitamins especially folic acid (it helps prevent brain and spinal cord defects), avoiding alcohol, smoking and illegal drugs, medical problems (diabetes, convulsions), family history of genetic problems, working conditions and immunizations. It is better to have knowledge of these things and do something about them before getting pregnant.  In your pregnancy, it is important to follow certain guidelines to have a healthy baby. It is very important to get good prenatal care and follow your caregiver's instructions. Prenatal care includes all the medical care you receive before your baby's birth. This helps to prevent problems during the pregnancy and childbirth.  HOME CARE INSTRUCTIONS    Start your prenatal visits by the 12th week of pregnancy or before when possible. They are usually scheduled monthly at first. They are more often in the last 2 months before delivery. It is important that you keep your caregiver's appointments and follow your caregiver's instructions regarding medication use, exercise, and diet.   During pregnancy, you are providing food for you and your baby. Eat a regular, well-balanced diet. Choose foods such as meat, fish, milk and other dairy products, vegetables, fruits, whole-grain breads and cereals. Your caregiver will inform you of the ideal weight gain depending on your current height and weight. Drink lots of liquids. Try to drink 8 glasses of water a day.   Alcohol is associated with a number of birth defects including fetal alcohol syndrome. It is best to avoid alcohol completely. Smoking will cause low birth rate and prematurity. Use of alcohol and nicotine during your pregnancy also increases the chances that your child will be chemically  dependent later in their life and may contribute to SIDS (Sudden Infant Death Syndrome).   Do not use illegal drugs.   Only take prescription or over-the-counter medications that are recommended by your caregiver. Other medications can cause genetic and physical problems in the baby.   Morning sickness can often be helped by keeping soda crackers at the bedside. Eat a couple before arising in the morning.   A sexual relationship may be continued until near the end of pregnancy if there are no other problems such as early (premature) leaking of amniotic fluid from the membranes, vaginal bleeding, painful intercourse or belly (abdominal) pain.   Exercise regularly. Check with your caregiver if you are unsure of the safety of some of your exercises.   Do not use hot tubs, steam rooms or saunas. These increase the risk of fainting or passing out and hurting yourself and the baby. Swimming is OK for exercise. Get plenty of rest, including afternoon naps when possible especially in the third trimester.   Avoid toxic odors and chemicals.   Do not wear high heels. They may cause you to lose your balance and fall.   Do not lift over 5 pounds. If you do lift anything, lift with your legs and thighs, not your back.   Avoid long trips, especially in the third trimester.   If you have to travel out of the city or state, take a copy of your medical records with you.  SEEK IMMEDIATE MEDICAL CARE IF:    You develop an unexplained oral temperature above 102 F (38.9 C), or as your caregiver suggests.   You have leaking of fluid from the vagina. If   leaking membranes are suspected, take your temperature and inform your caregiver of this when you call.   There is vaginal spotting or bleeding. Notify your caregiver of the amount and how many pads are used.   You continue to feel sick to your stomach (nauseous) and have no relief from remedies suggested, or you throw up (vomit) blood or coffee ground like  materials.   You develop upper abdominal pain.   You have round ligament discomfort in the lower abdominal area. This still must be evaluated by your caregiver.   You feel contractions of the uterus.   You do not feel the baby move, or there is less movement than before.   You have painful urination.   You have abnormal vaginal discharge.   You have persistent diarrhea.   You get a severe headache.   You have problems with your vision.   You develop muscle weakness.   You feel dizzy and faint.   You develop shortness of breath.   You develop chest pain.   You have back pain that travels down to your leg and feet.   You feel irregular or a very fast heartbeat.   You develop excessive weight gain in a short period of time (5 pounds in 3 to 5 days).   You are involved with a domestic violence situation.  Document Released: 01/05/2005 Document Revised: 12/25/2010 Document Reviewed: 06/29/2008  [employer] Patient Information 2012 [employer].

## 2011-06-19 ENCOUNTER — Ambulatory Visit: Payer: Medicaid Other | Admitting: Advanced Practice Midwife

## 2011-06-24 ENCOUNTER — Encounter: Payer: Medicaid Other | Admitting: Physician Assistant

## 2011-07-06 ENCOUNTER — Ambulatory Visit (INDEPENDENT_AMBULATORY_CARE_PROVIDER_SITE_OTHER): Payer: Medicaid Other | Admitting: Obstetrics and Gynecology

## 2011-07-06 ENCOUNTER — Encounter: Payer: Self-pay | Admitting: Obstetrics and Gynecology

## 2011-07-06 VITALS — BP 112/57 | HR 87 | Temp 98.3°F | Ht 64.5 in | Wt 188.9 lb

## 2011-07-06 DIAGNOSIS — Z113 Encounter for screening for infections with a predominantly sexual mode of transmission: Secondary | ICD-10-CM

## 2011-07-06 NOTE — Progress Notes (Signed)
States here for same pain and wants to get "Women's tests done and HIV"

## 2011-07-06 NOTE — Progress Notes (Signed)
Kristin Joseph [id number]  Chief Complaint  Patient presents with  . Pelvic Pain    same       SUBJECTIVE  HPI: SInce seen here 05/20/11 for oligomenorrhea with hx of menstrual interval q 2-6 mo x 1 yr, she has had a normal flow LMP 06/16/11. She has not been sexually active and she and her husband have both decided to get tested for all STDs since he has been with another partner. She would like to attempt pregnancy though she understands fertility is less with age and she is probably not ovulating regularly.    Past Medical History  Diagnosis Date  . Depression   . Stress headaches   . Pelvic pain   . Back pain    History reviewed. No pertinent past surgical history. History   Social History  . Marital Status: Single    Spouse Name: N/A    Number of Children: N/A  . Years of Education: N/A   Occupational History  . Not on file.   Social History Main Topics  . Smoking status: Never Smoker   . Smokeless tobacco: Never Used  . Alcohol Use: No  . Drug Use: No  . Sexually Active: Not Currently   Other Topics Concern  . Not on file   Social History Narrative  . No narrative on file   Current Outpatient Prescriptions on File Prior to Visit  Medication Sig Dispense Refill  . calcium carbonate (OS-CAL) 600 MG TABS Take 600 mg by mouth 2 (two) times daily with a meal.      . cholecalciferol (VITAMIN D) 1000 UNITS tablet Take 1,000 Units by mouth 2 (two) times daily.       No Known Allergies  ROS: Pertinent items in HPI  OBJECTIVE Blood pressure 112/57, pulse 87, temperature 98.3 F (36.8 C), height 5' 4.5" (1.638 m), weight 188 lb 14.4 oz (85.684 kg), last menstrual period 06/16/2011.  GENERAL: Well-developed, well-nourished female in no acute distress.  HEENT: Normocephalic, good dentition HEART: normal rate RESP: normal effort ABDOMEN: Soft, nontender EXTREMITIES: Nontender, no edema NEURO: Alert and oriented SPECULUM EXAM: NEFG, physiologic discharge,  no blood noted, cervix clean BIMANUAL: cervix posterior, mobile; uterus unable to outline due to body habitus; no adnexal tenderness or masses   LAB RESULTS From last encounter 05/20/11: PL, TSH, FSH, free and total testosterone all WNL    ASSESSMENT Oligomenorrhea Encounter for STD screening  PLAN GC, CT, HIV, RPR, HepB, HepC done Continue to keep menstrual calendar  F/U here in 6 months     [name] 07/06/2011 4:16 PM

## 2011-07-06 NOTE — Patient Instructions (Signed)
Safer Sex Your caregiver wants you to have this information about the infections that can be transmitted from sexual contact and how to prevent them. The idea behind safer sex is that you can be sexually active, and at the same time reduce the risk of giving or getting a sexually transmitted disease (STD). Every person should be aware of how to prevent him or herself and his or her sex partner from getting an STD. CAUSES OF STDS STDs are transmitted by sharing body fluids, which contain viruses and bacteria. The following fluids all transmit infections during sexual intercourse and sex acts:  Semen.   Saliva.   Urine.   Blood.   Vaginal mucus.  Examples of STDs include:  Chlamydia.   Gonorrhea.   Genital herpes.   Hepatitis B.   Human immunodeficiency virus or acquired immunodeficiency syndrome (HIV or AIDS).   Syphilis.   Trichomonas.   Pubic lice.   Human papillomavirus (HPV), which may include:   Genital warts.   Cervical dysplasia.   Cervical cancer (can develop with certain types of HPV).  SYMPTOMS  Sexual diseases often cause few or no symptoms until they are advanced, so a person can be infected and spread the infection without knowing it. Some STDs respond to treatment very well. Others, like HIV and herpes, cannot be cured, but are treated to reduce their effects. Specific symptoms include:  Abnormal vaginal discharge.   Irritation or itching in and around the vagina, and in the pubic hair.   Pain during sexual intercourse.   Bleeding during sexual intercourse.   Pelvic or abdominal pain.   Fever.   Growths in and around the vagina.   An ulcer in or around the vagina.   Swollen glands in the groin area.  DIAGNOSIS   Blood tests.   Pap test.   Culture test of abnormal vaginal discharge.   A test that applies a solution and examines the cervix with a lighted magnifying scope (colposcopy).   A test that examines the pelvis with a lighted  tube, through a small incision (laparoscopy).  TREATMENT  The treatment will depend on the cause of the STD.  Antibiotic treatment by injection, oral, creams, or suppositories in the vagina.   Over-the-counter medicated shampoo, to get rid of pubic lice.   Removing or treating growths with medicine, freezing, burning (electrocautery), or surgery.   Surgery treatment for HPV of the cervix.   Supportive medicines for herpes, HIV, AIDS, and hepatitis.  Being careful cannot eliminate all risk of infection, but sex can be made much safer. Safe sexual practices include body massage and gentle touching. Masturbation is safe, as long as body fluids do not contact skin that has sores or cuts. Dry kissing and oral sex on a man wearing a latex condom or on a woman wearing a female condom is also safe. Slightly less safe is intercourse while the man wears a latex condom or wet kissing. It is also safer to have one sex partner that you know is not having sex with anyone else. LENGTH OF ILLNESS An STD might be treated and cured in a week, sometimes a month, or more. And it can linger with symptoms for many years. STDs can also cause damage to the female organs. This can cause chronic pain, infertility, and recurrence of the STD, especially herpes, hepatitis, HIV, and HPV. HOME CARE INSTRUCTIONS AND PREVENTION  Alcohol and recreational drugs are often the reason given for not practicing safer sex. These substances affect   your judgment. Alcohol and recreational drugs can also impair your immune system, making you more vulnerable to disease.   Do not engage in risky and dangerous sexual practices, including:   Vaginal or anal sex without a condom.   Oral sex on a man without a condom.   Oral sex on a woman without a female condom.   Using saliva to lubricate a condom.   Any other sexual contact in which body fluids or blood from one partner contact the other partner.   You should use only latex  condoms for men and water soluble lubricants. Petroleum based lubricants or oils used to lubricate a condom will weaken the condom and increase the chance that it will break.   Think very carefully before having sex with anyone who is high risk for STDs and HIV. This includes IV drug users, people with multiple sexual partners, or people who have had an STD, or a positive hepatitis or HIV blood test.   Remember that even if your partner has had only one previous partner, their previous partner might have had multiple partners. If so, you are at high risk of being exposed to an STD. You and your sex partner should be the only sex partners with each other, with no one else involved.   A vaccine is available for hepatitis B and HPV through your caregiver or the Public Health Department. Everyone should be vaccinated with these vaccines.   Avoid risky sex practices. Sex acts that can break the skin make you more likely to get an STD.  SEEK MEDICAL CARE IF:   If you think you have an STD, even if you do not have any symptoms. Contact your caregiver for evaluation and treatment, if needed.   You think or know your sex partner has acquired an STD.   You have any of the symptoms mentioned above.  Document Released: 02/13/2004 Document Revised: 12/25/2010 Document Reviewed: 12/05/2008 [employer] Patient Information 2012 [employer]. 

## 2011-07-07 LAB — GC/CHLAMYDIA PROBE AMP, GENITAL
Chlamydia, DNA Probe: NEGATIVE
GC Probe Amp, Genital: NEGATIVE

## 2011-07-07 LAB — HIV ANTIBODY (ROUTINE TESTING W REFLEX): HIV: NONREACTIVE

## 2011-09-16 ENCOUNTER — Ambulatory Visit: Payer: Medicaid Other | Admitting: Obstetrics & Gynecology

## 2011-10-02 ENCOUNTER — Ambulatory Visit (INDEPENDENT_AMBULATORY_CARE_PROVIDER_SITE_OTHER): Payer: Medicaid Other | Admitting: Obstetrics and Gynecology

## 2011-10-02 VITALS — BP 101/54 | HR 81 | Temp 97.3°F | Wt 182.0 lb

## 2011-10-02 DIAGNOSIS — N912 Amenorrhea, unspecified: Secondary | ICD-10-CM

## 2011-10-02 DIAGNOSIS — N915 Oligomenorrhea, unspecified: Secondary | ICD-10-CM

## 2011-10-02 DIAGNOSIS — G8929 Other chronic pain: Secondary | ICD-10-CM

## 2011-10-02 DIAGNOSIS — R109 Unspecified abdominal pain: Secondary | ICD-10-CM

## 2011-10-02 DIAGNOSIS — R102 Pelvic and perineal pain: Secondary | ICD-10-CM | POA: Insufficient documentation

## 2011-10-02 MED ORDER — SIMETHICONE 80 MG PO CHEW: 80.0000 mg | CHEWABLE_TABLET | Freq: Four times a day (QID) | ORAL | Status: DC | PRN

## 2011-10-02 NOTE — Patient Instructions (Addendum)
Flatulence Burping releases air that you swallow. The bubbles from some drinks may cause burps. There are good germs in your gut to help you digest food. Gas is produced by these germs and released from your bottom. Most people release 3 to 4 quarts of gas every day. This is normal. HOME CARE  Eat or drink less of the foods or liquids that give you gas.   Take the time to chew your food well. Talk less while you eat.   Do not suck on ice or hard candy.   Sip slowly. Stir some of the bubbles out of fizzy drinks with a spoon or straw.   Avoid chewing gum or smoking.   Ask your doctor about liquids and tablets that may help control burping and gas.   Only take medicine as told by your doctor.  GET HELP RIGHT AWAY IF:   There is discomfort when you burp or pass gas.   You throw up (vomit) when you burp.   Poop (stool) comes out when you pass gas.   Your belly is puffy (swollen) and hard.  MAKE SURE YOU:   Understand these instructions.   Will watch your condition.   Will get help right away if you are not doing well or get worse.  Document Released: 11/08/2007 Document Revised: 12/25/2010 Document Reviewed: 11/08/2007 [facility] Patient Information 2012 [address] [place], [place].Infertility WHAT IS INFERTILITY?  Infertility is usually defined as not being able to get pregnant after trying for one year of regular sexual intercourse without the use of contraceptives. Or not being able to carry a pregnancy to term and have a baby. The infertility rate in the [place] is around 10%. Pregnancy is the result of a chain of events. A woman must release an egg from one of her ovaries (ovulation). The egg must be fertilized by the female sperm. Then it travels through a fallopian tube into the uterus (womb), where it attaches to the wall of the uterus and grows. A man must have enough sperm, and the sperm must join with (fertilize) the egg along the way, at the proper time. The fertilized egg must then  become attached to the inside of the uterus. While this may seem simple, many things can happen to prevent pregnancy from occurring.  WHOSE PROBLEM IS IT?  About 20% of infertility cases are due to problems with the man (female factors) and 65% are due to problems with the woman (female factors). Other cases are due to a combination of female and female factors or to unknown causes.  WHAT CAUSES INFERTILITY IN MEN?  Infertility in men is often caused by problems with making enough normal sperm or getting the sperm to reach the egg. Problems with sperm may exist from birth or develop later in life, due to illness or injury. Some men produce no sperm, or produce too few sperm (oligospermia). Other problems include:  Sexual dysfunction.   Hormonal or endocrine problems.   Age. Female fertility decreases with age, but not at as young an age as female fertility.   Infection.   Congenital problems. Birth defect, such as absence of the tubes that carry the sperm (vas deferens).   Genetic/chromosomal problems.   Antisperm antibody problems.   Retrograde ejaculation (sperm go into the bladder).   Varicoceles, spematoceles, or tumors of the testicles.   Lifestyle can influence the number and quality of a man's sperm.   Alcohol and drugs can temporarily reduce sperm quality.   Environmental toxins, including  pesticides and lead, may cause some cases of infertility in men.  WHAT CAUSES INFERTILITY IN WOMEN?   Problems with ovulation account for most infertility in women. Without ovulation, eggs are not available to be fertilized.   Signs of problems with ovulation include irregular menstrual periods or no periods at all.   Simple lifestyle factors, including stress, diet, or athletic training, can affect a woman's hormonal balance.   Age. Fertility begins to decrease in women in the early 46s and is worse after age 37.   Much less often, a hormonal imbalance from a serious medical problem, such  as a pituitary gland tumor, thyroid or other chronic medical disease, can cause ovulation problems.   Pelvic infections.   Polycystic ovary syndrome (increase in female hormones, unable to ovulate).   Alcohol or illegal drugs.   Environmental toxins, radiation, pesticides, and certain chemicals.   Aging is an important factor in female infertility.   The ability of a woman's ovaries to produce eggs declines with age, especially after age 58. About one third of couples where the woman is over 35 will have problems with fertility.   By the time she reaches menopause when her monthly periods stop for good, a woman can no longer produce eggs or become pregnant.   Other problems can also lead to infertility in women. If the fallopian tubes are blocked at one or both ends, the egg cannot travel through the tubes into the uterus. Scar tissue (adhesions) in the pelvis may cause blocked tubes. This may result from pelvic inflammatory disease, endometriosis, or surgery for an ectopic pregnancy (fertilized egg implanted outside the uterus) or any pelvic or abdominal surgery causing adhesions.   Fibroid tumors or polyps of the uterus.   Congenital (birth defect) abnormalities of the uterus.   Infection of the cervix (cervicitis).   Cervical stenosis (narrowing).   Abnormal cervical mucus.   Polycystic ovary syndrome.   Having sexual intercourse too often (every other day or 4 to 5 times a week).   Obesity.   Anorexia.   Poor nutrition.   Over exercising, with loss of body fat.   DES. Your mother received diethylstilbesterol hormone when pregnant with you.  HOW IS INFERTILITY TESTED?  If you have been trying to have a baby without success, you may want to seek medical help. You should not wait for one year of trying before seeing a health care provider if:  You are over 35.   You have reason to believe that there may be a fertility problem.  A medical evaluation may determine the  reasons for a couple's infertility. Usually this process begins with:  Physical exams.   Medical histories of both partners.   Sexual histories of both partners.  If there is no obvious problem, like improperly timed intercourse or absence of ovulation, tests may be needed.   For a man, testing usually begins with tests of his semen to look at:   The number of sperm.   The shape of sperm.   Movement of his sperm.   Taking a complete medical and surgical history.   Physical examination.   Check for infection of the female reproductive organs.  Sometimes hormone tests are done.   For a woman, the first step in testing is to find out if she is ovulating each month. There are several ways to do this. For example, she can keep track of changes in her morning body temperature and in the texture of her cervical  mucus. Another tool is a home ovulation test kit, which can be bought at drug or grocery stores.   Checks of ovulation can also be done in the doctor's office, using blood tests for hormone levels or ultrasound tests of the ovaries. If the woman is ovulating, more tests will need to be done. Some common female tests include:   Hysterosalpingogram: An x-ray of the fallopian tubes and uterus after they are injected with dye. It shows if the tubes are open and shows the shape of the uterus.   Laparoscopy: An exam of the tubes and other female organs for disease. A lighted tube called a laparoscope is used to see inside the abdomen.   Endometrial biopsy: Sample of uterus tissue taken on the first day of the menstrual period, to see if the tissue indicates you are ovulating.   Transvaginal ultrasound: Examines the female organs.   Hysteroscopy: Uses a lighted tube to examine the cervix and inside the uterus, to see if there are any abnormalities inside the uterus.  TREATMENT  Depending on the test results, different treatments can be suggested. The type of treatment depends on the  cause. 85 to 90% of infertility cases are treated with drugs or surgery.   Various fertility drugs may be used for women with ovulation problems. It is important to talk with your caregiver about the drug to be used. You should understand the drug's benefits and side effects. Depending on the type of fertility drug and the dosage of the drug used, multiple births (twins or multiples) can occur in some women.   If needed, surgery can be done to repair damage to a woman's ovaries, fallopian tubes, cervix, or uterus.   Surgery or medical treatment for endometriosis or polycystic ovary syndrome. Sometimes a man has an infertility problem that can be corrected with medicine or by surgery.   Intrauterine insemination (IUI) of sperm, timed with ovulation.   Change in lifestyle, if that is the cause (lose weight, increase exercise, and stop smoking, drinking excessively, or taking illegal drugs).   Other types of surgery:   Removing growths inside and on the uterus.   Removing scar tissue from inside of the uterus.   Fixing blocked tubes.   Removing scar tissue in the pelvis and around the female organs.  WHAT IS ASSISTED REPRODUCTIVE TECHNOLOGY (ART)?  Assisted reproductive technology (ART) is another form of special methods used to help infertile couples. ART involves handling both the woman's eggs and the man's sperm. Success rates vary and depend on many factors. ART can be expensive and time-consuming. But ART has made it possible for many couples to have children that otherwise would not have been conceived. Some methods are listed below:  In vitro fertilization (IVF). IVF is often used when a woman's fallopian tubes are blocked or when a man has low sperm counts. A drug is used to stimulate the ovaries to produce multiple eggs. Once mature, the eggs are removed and placed in a culture dish with the man's sperm for fertilization. After about 40 hours, the eggs are examined to see if they have  become fertilized by the sperm and are dividing into cells. These fertilized eggs (embryos) are then placed in the woman's uterus. This bypasses the fallopian tubes.   Gamete intrafallopian transfer (GIFT) is similar to IVF, but used when the woman has at least one normal fallopian tube. Three to five eggs are placed in the fallopian tube, along with the man's sperm, for fertilization  inside the woman's body.   Zygote intrafallopian transfer (ZIFT), also called tubal embryo transfer, combines IVF and GIFT. The eggs retrieved from the woman's ovaries are fertilized in the lab and placed in the fallopian tubes rather than in the uterus.   ART procedures sometimes involve the use of donor eggs (eggs from another woman) or previously frozen embryos. Donor eggs may be used if a woman has impaired ovaries or has a genetic disease that could be passed on to her baby.   When performing ART, you are at higher risk for resulting in multiple pregnancies, twins, triplets or more.   Intracytoplasma sperm injection is a procedure that injects a single sperm into the egg to fertilize it.   Embryo transplant is a procedure that starts after growing an embryo in a special media (chemical solution) developed to keep the embryo alive for 2 to 5 days, and then transplanting it into the uterus.  In cases where a cause cannot be found and pregnancy does not occur, adoption may be a consideration. Document Released: 01/08/2003 Document Revised: 12/25/2010 Document Reviewed: 12/04/2008 [facility] Patient Information 2012 [place], [place].

## 2011-10-02 NOTE — Progress Notes (Signed)
Chief Complaint: Amenorrhea and Abdominal Pain   None    SUBJECTIVE HPI: Kristin Joseph is a 41 y.o. [phone] is primary concern today is amenorrhea since May and she thinks she may be pregnant. She is attempting pregnancy. She often skips one to 2 months in her cycles and has declined treatment for oligomenorrhea. She denies subjective symptoms of pregnancy. She is Muslim and fear is that her husband believe her if she cannot conceive. She is interested in referral to our infertility clinic. Her other concern is an 8 month history of left upper quadrant diffuse abdominal pain that is poorly described or localized. Not experiencing pain now. She does report flatulence and some relief of pain after passing flatus. Is not improved or worsens with eating. No dysuria, hematuria or urinary urgency or frequency. No nausea, vomiting, diarrhea, constipation.  Past Medical History  Diagnosis Date  . Depression   . Stress headaches   . Pelvic pain   . Back pain    OB History    Grav Para Term Preterm Abortions TAB SAB Ect Mult Living   4 3 3  1  1   3      # Outc Date GA Lbr Len/2nd Wgt Sex Del Anes PTL Lv   1 SAB            2 TRM      SVD      3 TRM      SVD      4 TRM      SVD        No past surgical history on file. History   Social History  . Marital Status: Single    Spouse Name: N/A    Number of Children: N/A  . Years of Education: N/A   Occupational History  . Not on file.   Social History Main Topics  . Smoking status: Never Smoker   . Smokeless tobacco: Never Used  . Alcohol Use: No  . Drug Use: No  . Sexually Active: Not Currently   Other Topics Concern  . Not on file   Social History Narrative  . No narrative on file   Current Outpatient Prescriptions on File Prior to Visit  Medication Sig Dispense Refill  . calcium carbonate (OS-CAL) 600 MG TABS Take 600 mg by mouth 2 (two) times daily with a meal.      . cholecalciferol (VITAMIN D) 1000 UNITS tablet Take  1,000 Units by mouth 2 (two) times daily.      Marland Kitchen PRESCRIPTION MEDICATION 1 tablet 2 (two) times daily. Patient states take 2 pills for back pain, not sure of name, given by [facility] MD      . simethicone (GAS-X) 80 MG chewable tablet Chew 1 tablet (80 mg total) by mouth every 6 (six) hours as needed for flatulence.  30 tablet  0   No Known Allergies  ROS: Pertinent items in HPI  OBJECTIVE Blood pressure 101/54, pulse 81, temperature 97.3 F (36.3 C), temperature source Oral, weight 182 lb (82.555 kg), last menstrual period 05/23/2011. GENERAL: Well-developed, well-nourished female in no acute distress.  HEENT: Normocephalic HEART: normal rate RESP: normal effort ABDOMEN: Soft, non-tender, ND, BS normal EXTREMITIES: Nontender, no edema NEURO: Alert and oriented  LAB RESULTS Results for orders placed in visit on 10/02/11 (from the past 24 hour(s))  POCT PREGNANCY, URINE     Status: Normal   Collection Time   10/02/11 11:46 AM      Component Value Range  Preg Test, Ur NEGATIVE  NEGATIVE    ASSESSMENT 1. Chronic abdominal pain   2. Oligomenorrhea   Flatulence Infertility  PLAN Refer to Infertility Clinic PNV samples Advised simethicone   Med List    Warning       Cannot display discharge medications because this is not an admission.          Danae Orleans, CNM 10/02/2011  12:03 PM

## 2011-10-03 LAB — URINALYSIS
Bilirubin Urine: NEGATIVE
Ketones, ur: NEGATIVE mg/dL
Nitrite: NEGATIVE
Specific Gravity, Urine: 1.026 (ref 1.005–1.030)
pH: 5 (ref 5.0–8.0)

## 2011-10-22 ENCOUNTER — Encounter: Payer: Medicaid Other | Admitting: Obstetrics & Gynecology

## 2011-11-05 ENCOUNTER — Other Ambulatory Visit: Payer: Self-pay | Admitting: Family Medicine

## 2011-11-05 DIAGNOSIS — N63 Unspecified lump in unspecified breast: Secondary | ICD-10-CM

## 2011-11-16 ENCOUNTER — Encounter: Payer: Medicaid Other | Admitting: Obstetrics & Gynecology

## 2011-11-18 ENCOUNTER — Ambulatory Visit
Admission: RE | Admit: 2011-11-18 | Discharge: 2011-11-18 | Disposition: A | Discharge status: Home / Self Care | Payer: Medicaid Other | Source: Ambulatory Visit | Attending: Family Medicine | Admitting: Family Medicine

## 2011-11-18 ENCOUNTER — Other Ambulatory Visit: Payer: Self-pay | Admitting: Family Medicine

## 2011-11-18 DIAGNOSIS — N63 Unspecified lump in unspecified breast: Secondary | ICD-10-CM

## 2011-12-02 ENCOUNTER — Ambulatory Visit (INDEPENDENT_AMBULATORY_CARE_PROVIDER_SITE_OTHER): Payer: Medicaid Other | Admitting: Advanced Practice Midwife

## 2011-12-02 ENCOUNTER — Encounter: Payer: Self-pay | Admitting: Obstetrics & Gynecology

## 2011-12-02 ENCOUNTER — Other Ambulatory Visit (HOSPITAL_COMMUNITY)
Admission: RE | Admit: 2011-12-02 | Discharge: 2011-12-02 | Disposition: A | Discharge status: Home / Self Care | Payer: Medicaid Other | Source: Ambulatory Visit | Attending: Obstetrics & Gynecology | Admitting: Obstetrics & Gynecology

## 2011-12-02 ENCOUNTER — Other Ambulatory Visit: Payer: Self-pay | Admitting: Advanced Practice Midwife

## 2011-12-02 VITALS — BP 101/65 | HR 68 | Temp 97.6°F | Resp 20 | Ht 65.0 in | Wt 184.5 lb

## 2011-12-02 DIAGNOSIS — N949 Unspecified condition associated with female genital organs and menstrual cycle: Secondary | ICD-10-CM

## 2011-12-02 DIAGNOSIS — R102 Pelvic and perineal pain: Secondary | ICD-10-CM

## 2011-12-02 DIAGNOSIS — N760 Acute vaginitis: Secondary | ICD-10-CM | POA: Insufficient documentation

## 2011-12-02 DIAGNOSIS — B379 Candidiasis, unspecified: Secondary | ICD-10-CM

## 2011-12-02 NOTE — Progress Notes (Signed)
Pt states she has been having abdominal, flank and vaginal pain x2 months.   Pt denies d/c or bleeding

## 2011-12-02 NOTE — Patient Instructions (Signed)
Pelvic Pain Pelvic pain is pain below the belly button and located between your hips. Acute pain may last a few hours or days. Chronic pelvic pain may last weeks and months. The cause may be different for different types of pain. The pain may be dull or sharp, mild or severe and can interfere with your daily activities. Write down and tell your caregiver:   Exactly where the pain is located.  If it comes and goes or is there all the time.  When it happens (with sex, urination, bowel movement, etc.)  If the pain is related to your menstrual period or stress. Your caregiver will take a full history and do a complete physical exam and Pap test. CAUSES   Painful menstrual periods (dysmenorrhea).  Normal ovulation (Mittelschmertz) that occurs in the middle of the menstrual cycle every month.  The pelvic organs get engorged with blood just before the menstrual period (pelvic congestive syndrome).  Scar tissue from an infection or past surgery (pelvic adhesions).  Cancer of the female pelvic organs. When there is pain with cancer, it has been there for a long time.  The lining of the uterus (endometrium) abnormally grows in places like the pelvis and on the pelvic organs (endometriosis).  A form of endometriosis with the lining of the uterus present inside of the muscle tissue of the uterus (adenomyosis).  Fibroid tumor (noncancerous) in the uterus.  Bladder problems such as infection, bladder spasms of the muscle tissue of the bladder.  Intestinal problems (irritable bowel syndrome, colitis, an ulcer or gastrointestinal infection).  Polyps of the cervix or uterus.  Pregnancy in the tube (ectopic pregnancy).  The opening of the cervix is too small for the menstrual blood to flow through it (cervical stenosis).  Physical or sexual abuse (past or present).  Musculo-skeletal problems from poor posture, problems with the vertebrae of the lower back or the uterine pelvic muscles falling  (prolapse).  Psychological problems such as depression or stress.  IUD (intrauterine device) in the uterus. DIAGNOSIS  Tests to make a diagnosis depends on the type, location, severity and what causes the pain to occur. Tests that may be needed include:  Blood tests.  Urine tests  Ultrasound.  X-rays.  CT Scan.  MRI.  Laparoscopy.  Major surgery. TREATMENT  Treatment will depend on the cause of the pain, which includes:  Prescription or over-the-counter pain medication.  Antibiotics.  Birth control pills.  Hormone treatment.  Nerve blocking injections.  Physical therapy.  Antidepressants.  Counseling with a psychiatrist or psychologist.  Minor or major surgery. HOME CARE INSTRUCTIONS   Only take over-the-counter or prescription medicines for pain, discomfort or fever as directed by your caregiver.  Follow your caregiver's advice to treat your pain.  Rest.  Avoid sexual intercourse if it causes the pain.  Apply warm or cold compresses (which ever works best) to the pain area.  Do relaxation exercises such as yoga or meditation.  Try acupuncture.  Avoid stressful situations.  Try group therapy.  If the pain is because of a stomach/intestinal upset, drink clear liquids, eat a bland light food diet until the symptoms go away. SEEK MEDICAL CARE IF:   You need stronger prescription pain medication.  You develop pain with sexual intercourse.  You have pain with urination.  You develop a temperature of 102 F (38.9 C) with the pain.  You are still in pain after 4 hours of taking prescription medication for the pain.  You need depression medication.    Your IUD is causing pain and you want it removed. SEEK IMMEDIATE MEDICAL CARE IF:  You develop very severe pain or tenderness.  You faint, have chills, severe weakness or dehydration.  You develop heavy vaginal bleeding or passing solid tissue.  You develop a temperature of 102 F (38.9 C)  with the pain.  You have blood in the urine.  You are being physically or sexually abused.  You have uncontrolled vomiting and diarrhea.  You are depressed and afraid of harming yourself or someone else. Document Released: 02/13/2004 Document Revised: 03/30/2011 Document Reviewed: 11/10/2007 [employer] Patient Information 2013 [employer].  

## 2011-12-02 NOTE — Progress Notes (Signed)
Subjective:     Kristin Joseph is a 41 y.o. female here for a routine exam.  Current complaints: LLQ pain for 2 years, Irregular periods (misses 2-4 mos between periods), and inability to conceive (prior note states her husband threatened to have a baby with another woman if she cannot conceive).    Gynecologic History Patient's last menstrual period was 09/13/2011. Contraception: none Last Pap: Feb 2013. Results were: normal Last mammogram: unsure.  Obstetric History OB History    Grav Para Term Preterm Abortions TAB SAB Ect Mult Living   4 3 3  1  1   3      # Outc Date GA Lbr Len/2nd Wgt Sex Del Anes PTL Lv   1 SAB            2 TRM      SVD      3 TRM      SVD      4 TRM      SVD          The following portions of the patient's history were reviewed and updated as appropriate: allergies, current medications, past family history, past medical history, past social history, past surgical history and problem list.  Review of Systems Pertinent items are noted in HPI.    Objective:    General appearance: alert, cooperative and no distress Abdomen: soft, non-tender; bowel sounds normal; no masses,  no organomegaly Pelvic: cervix normal in appearance, external genitalia normal, no adnexal masses or tenderness, no cervical motion tenderness, rectovaginal septum normal, uterus normal size, shape, and consistency and vagina normal without discharge    Assessment:    Healthy female exam.   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  Plan:  US pelvis to rule out ovarian cyst  Refer to Fam Med for LLQ pain   Follow up in: 2 weeks.   for results  Refer to Infertility for consultation if pt desires

## 2011-12-07 ENCOUNTER — Ambulatory Visit (HOSPITAL_COMMUNITY)
Admission: RE | Admit: 2011-12-07 | Discharge: 2011-12-07 | Disposition: A | Discharge status: Home / Self Care | Payer: Medicaid Other | Source: Ambulatory Visit | Attending: Advanced Practice Midwife | Admitting: Advanced Practice Midwife

## 2011-12-07 DIAGNOSIS — N979 Female infertility, unspecified: Secondary | ICD-10-CM | POA: Insufficient documentation

## 2011-12-07 DIAGNOSIS — N854 Malposition of uterus: Secondary | ICD-10-CM | POA: Insufficient documentation

## 2011-12-07 DIAGNOSIS — D259 Leiomyoma of uterus, unspecified: Secondary | ICD-10-CM | POA: Insufficient documentation

## 2011-12-07 DIAGNOSIS — N926 Irregular menstruation, unspecified: Secondary | ICD-10-CM | POA: Insufficient documentation

## 2011-12-07 DIAGNOSIS — N949 Unspecified condition associated with female genital organs and menstrual cycle: Secondary | ICD-10-CM | POA: Insufficient documentation

## 2011-12-07 DIAGNOSIS — B379 Candidiasis, unspecified: Secondary | ICD-10-CM

## 2011-12-11 ENCOUNTER — Other Ambulatory Visit (HOSPITAL_COMMUNITY): Payer: Medicaid Other

## 2011-12-11 ENCOUNTER — Inpatient Hospital Stay (HOSPITAL_COMMUNITY): Admit: 2011-12-11 | Payer: Medicaid Other

## 2011-12-11 ENCOUNTER — Emergency Department (HOSPITAL_COMMUNITY)
Admission: EM | Admit: 2011-12-11 | Discharge: 2011-12-11 | Disposition: A | Discharge status: Home / Self Care | Payer: Medicaid Other | Source: Home / Self Care

## 2011-12-11 DIAGNOSIS — D649 Anemia, unspecified: Secondary | ICD-10-CM

## 2011-12-11 DIAGNOSIS — M542 Cervicalgia: Secondary | ICD-10-CM

## 2011-12-11 DIAGNOSIS — B379 Candidiasis, unspecified: Secondary | ICD-10-CM

## 2011-12-11 MED ORDER — CYCLOBENZAPRINE HCL 5 MG PO TABS: 5.0000 mg | ORAL_TABLET | Freq: Three times a day (TID) | ORAL | Status: DC | PRN

## 2011-12-11 NOTE — ED Notes (Signed)
Left arm and back pain for three months.

## 2011-12-11 NOTE — ED Provider Notes (Signed)
Patient Demographics  Kristin Joseph, is a 41 y.o. female  ZOX:[id number]  WJX:[id number]  DOB - 1970-05-28  Chief Complaint  Patient presents with  . Back Pain  . left arm pain         Subjective:   Kristin Joseph today has, No headache, No chest pain, No abdominal pain - No Nausea, No new weakness tingling or numbness, No Cough - SOB. C spine neck pain with radiation to L arm ongoing for months.  Objective:    Filed Vitals:   12/11/11 1107  BP: 107/43  Pulse: 73  Temp: 98.1 F (36.7 C)  TempSrc: Oral  Resp: 16  SpO2: 100%     Exam  Awake Alert, Oriented X 3, No new F.N deficits, Normal affect .AT,PERRAL Supple Neck,No JVD, No cervical lymphadenopathy appriciated.  Symmetrical Chest wall movement, Good air movement bilaterally, CTAB RRR,No Gallops,Rubs or new Murmurs, No Parasternal Heave +ve B.Sounds, Abd Soft, Non tender, No organomegaly appriciated, No rebound - guarding or rigidity. No Cyanosis, Clubbing or edema, No new Rash or bruise Left arm comparatively weak to the right, grip strength is slightly weak,   Data Review   CBC No results found for this basename: WBC:5,HGB:5,HCT:5,PLT:5,MCV:5,MCH:5,MCHC:5,RDW:5,NEUTRABS:5,LYMPHSABS:5,MONOABS:5,EOSABS:5,BASOSABS:5,BANDABS:5,BANDSABD:5 in the last 168 hours  Chemistries   No results found for this basename: NA:5,K:5,CL:5,CO2:5,GLUCOSE:5,BUN:5,CREATININE:5,GFRCGP,:5,CALCIUM:5,MG:5,AST:5,ALT:5,ALKPHOS:5,BILITOT:5 in the last 168 hours ------------------------------------------------------------------------------------------------------------------ No results found for this basename: HGBA1C:2 in the last 72 hours ------------------------------------------------------------------------------------------------------------------ No results found for this basename: CHOL:2,HDL:2,LDLCALC:2,TRIG:2,CHOLHDL:2,LDLDIRECT:2 in the last 72  hours ------------------------------------------------------------------------------------------------------------------ No results found for this basename: TSH,T4TOTAL,FREET3,T3FREE,THYROIDAB in the last 72 hours ------------------------------------------------------------------------------------------------------------------ No results found for this basename: VITAMINB12:2,FOLATE:2,FERRITIN:2,TIBC:2,IRON:2,RETICCTPCT:2 in the last 72 hours  Coagulation profile  No results found for this basename: INR:5,PROTIME:5 in the last 168 hours  Urinalysis    Component Value Date/Time   COLORURINE YELLOW 10/02/2011 1207   APPEARANCEUR CLEAR 10/02/2011 1207   LABSPEC 1.026 10/02/2011 1207   PHURINE 5.0 10/02/2011 1207   GLUCOSEU NEG 10/02/2011 1207   HGBUR NEG 10/02/2011 1207   HGBUR negative 10/01/2008 0919   BILIRUBINUR NEG 10/02/2011 1207   KETONESUR NEG 10/02/2011 1207   PROTEINUR NEG 10/02/2011 1207   UROBILINOGEN 0.2 10/02/2011 1207   NITRITE NEG 10/02/2011 1207   LEUKOCYTESUR NEG 10/02/2011 1207     Prior to Admission medications   Medication Sig Start Date End Date Taking? Authorizing Provider  calcium carbonate (OS-CAL) 600 MG TABS Take 600 mg by mouth 2 (two) times daily with a meal.    Historical Provider, MD  cholecalciferol (VITAMIN D) 1000 UNITS tablet Take 1,000 Units by mouth 2 (two) times daily.    Historical Provider, MD  cyclobenzaprine (FLEXERIL) 5 MG tablet Take 1 tablet (5 mg total) by mouth 3 (three) times daily as needed for muscle spasms (headache). 12/11/11   Leroy Sea, MD  prenatal vitamin w/FE, FA (PRENATAL 1 + 1) 27-1 MG TABS Take 1 tablet by mouth daily.    Historical Provider, MD  PRESCRIPTION MEDICATION 1 tablet 2 (two) times daily. Patient states take 2 pills for back pain, not sure of name, given by [facility] MD    Historical Provider, MD  simethicone (GAS-X) 80 MG chewable tablet Chew 1 tablet (80 mg total) by mouth every 6 (six) hours as needed for  flatulence. 10/02/11 10/12/11  Deirdre Colin Mulders, CNM     Assessment & Plan   1. Neck pain radiating to the left arm mostly in the C3-4 and C5-6 dermatome, with some left arm weakness, highly suspicious for C-spine  disc disease, will order MRI C-spine with outpatient neuro followup. Patient to come back here in 3 weeks for MRI results, she has C-spine muscle spasms for which Flexeril will be prescribed. No history of kidney problems, will check a urine pregnancy test to make sure she is not pregnant before the MRI.   Results for ALTHEIA, SHAFRAN (MRN [id number]) as of 12/11/2011 11:20  Ref. Range 03/28/2010 23:37  Sodium Latest Range: 135-145 meq/L 142  Potassium Latest Range: 3.5-5.3 meq/L 4.0  Chloride Latest Range: 96-112 meq/L 105  CO2 Latest Range: 19-32 meq/L 25  BUN Latest Range: 6-23 mg/dL 14  Creatinine Latest Range: 0.40-1.20 mg/dL 8.29  Calcium Latest Range: 8.4-10.5 mg/dL 9.1  Glucose Latest Range: 70-99 mg/dL 78  Alkaline Phosphatase Latest Range: 39-117 units/L 51  Albumin Latest Range: 3.5-5.2 g/dL 4.5  AST Latest Range: 0-37 units/L 15  ALT Latest Range: 0-35 units/L 11  Total Protein Latest Range: 6.0-8.3 g/dL 7.1  Total Bilirubin Latest Range: 0.3-1.2 mg/dL 0.5     Follow-up Information    Schedule an appointment as soon as possible for a visit in 2 weeks to follow up.      Follow up with Pcp Not In System.      Follow up with CRAM,GARY P, MD. Schedule an appointment as soon as possible for a visit in 3 weeks.   Contact information:   [address] [place] [address] [phone]            Leroy Sea M.D on 12/11/2011 at 11:17 AM   Leroy Sea, MD 12/11/11 [phone]

## 2011-12-14 ENCOUNTER — Ambulatory Visit (INDEPENDENT_AMBULATORY_CARE_PROVIDER_SITE_OTHER): Payer: Medicaid Other | Admitting: Obstetrics and Gynecology

## 2011-12-14 VITALS — BP 113/71 | HR 74 | Temp 97.2°F | Ht 64.5 in | Wt 189.0 lb

## 2011-12-14 DIAGNOSIS — N949 Unspecified condition associated with female genital organs and menstrual cycle: Secondary | ICD-10-CM

## 2011-12-14 DIAGNOSIS — R102 Pelvic and perineal pain unspecified side: Secondary | ICD-10-CM

## 2011-12-14 DIAGNOSIS — N915 Oligomenorrhea, unspecified: Secondary | ICD-10-CM

## 2011-12-14 NOTE — Progress Notes (Signed)
Patient ID: Kristin Joseph, female   DOB: 23-Jun-1970, 41 y.o.   MRN: [id number] 41 yo [id number] presenting today to discuss results of pelvic ultrasound  IMPRESSION:  1. Retroverted uterus with 1.1 cm fibroid in the left posterior  corpus.  2. Normal ovaries. No adnexal mass identified.  Results reviewed and explained to the patient. Patient has multiple questions on her inability to conceive. She reports irregular menses and the fact that her husband lives in [place]. Explained to the patient that her situation is challenging due to her decreased fertility associated with age, perimenopausal state and physical distance with her partner. Advised patient to seek assistance from infertility specialist if pregnancy is truly desired. Advised patient to use ovulation kits in the meantime. Patient verbalized understanding and will schedule an appointment with REI after discussing situation with her partner.

## 2011-12-15 ENCOUNTER — Ambulatory Visit (HOSPITAL_COMMUNITY): Admission: RE | Admit: 2011-12-15 | Payer: Medicaid Other | Source: Ambulatory Visit

## 2011-12-21 ENCOUNTER — Ambulatory Visit (HOSPITAL_COMMUNITY): Payer: Medicaid Other

## 2012-01-18 ENCOUNTER — Emergency Department (HOSPITAL_COMMUNITY)
Admission: EM | Admit: 2012-01-18 | Discharge: 2012-01-18 | Disposition: A | Discharge status: Home / Self Care | Payer: Medicaid Other | Attending: Emergency Medicine | Admitting: Emergency Medicine

## 2012-01-18 ENCOUNTER — Encounter (HOSPITAL_COMMUNITY): Payer: Self-pay | Admitting: Emergency Medicine

## 2012-01-18 DIAGNOSIS — X58XXXA Exposure to other specified factors, initial encounter: Secondary | ICD-10-CM | POA: Insufficient documentation

## 2012-01-18 DIAGNOSIS — Y9389 Activity, other specified: Secondary | ICD-10-CM | POA: Insufficient documentation

## 2012-01-18 DIAGNOSIS — Z3202 Encounter for pregnancy test, result negative: Secondary | ICD-10-CM | POA: Insufficient documentation

## 2012-01-18 DIAGNOSIS — M5412 Radiculopathy, cervical region: Secondary | ICD-10-CM | POA: Insufficient documentation

## 2012-01-18 DIAGNOSIS — R5381 Other malaise: Secondary | ICD-10-CM | POA: Insufficient documentation

## 2012-01-18 DIAGNOSIS — M2550 Pain in unspecified joint: Secondary | ICD-10-CM | POA: Insufficient documentation

## 2012-01-18 DIAGNOSIS — M542 Cervicalgia: Secondary | ICD-10-CM | POA: Insufficient documentation

## 2012-01-18 DIAGNOSIS — S46009A Unspecified injury of muscle(s) and tendon(s) of the rotator cuff of unspecified shoulder, initial encounter: Secondary | ICD-10-CM

## 2012-01-18 DIAGNOSIS — R209 Unspecified disturbances of skin sensation: Secondary | ICD-10-CM | POA: Insufficient documentation

## 2012-01-18 DIAGNOSIS — M79609 Pain in unspecified limb: Secondary | ICD-10-CM | POA: Insufficient documentation

## 2012-01-18 DIAGNOSIS — Y929 Unspecified place or not applicable: Secondary | ICD-10-CM | POA: Insufficient documentation

## 2012-01-18 DIAGNOSIS — S46909A Unspecified injury of unspecified muscle, fascia and tendon at shoulder and upper arm level, unspecified arm, initial encounter: Secondary | ICD-10-CM | POA: Insufficient documentation

## 2012-01-18 DIAGNOSIS — S4980XA Other specified injuries of shoulder and upper arm, unspecified arm, initial encounter: Secondary | ICD-10-CM | POA: Insufficient documentation

## 2012-01-18 DIAGNOSIS — Z8659 Personal history of other mental and behavioral disorders: Secondary | ICD-10-CM | POA: Insufficient documentation

## 2012-01-18 LAB — CBC WITH DIFFERENTIAL/PLATELET
Basophils Relative: 0 % (ref 0–1)
Eosinophils Absolute: 0.1 10*3/uL (ref 0.0–0.7)
Eosinophils Relative: 1 % (ref 0–5)
HCT: 37.8 % (ref 36.0–46.0)
Hemoglobin: 12.3 g/dL (ref 12.0–15.0)
Lymphs Abs: 1.3 10*3/uL (ref 0.7–4.0)
MCH: 29.5 pg (ref 26.0–34.0)
MCHC: 32.5 g/dL (ref 30.0–36.0)
MCV: 90.6 fL (ref 78.0–100.0)
Monocytes Absolute: 0.3 10*3/uL (ref 0.1–1.0)
Monocytes Relative: 7 % (ref 3–12)
Neutrophils Relative %: 58 % (ref 43–77)
RBC: 4.17 MIL/uL (ref 3.87–5.11)

## 2012-01-18 LAB — COMPREHENSIVE METABOLIC PANEL
Albumin: 3.7 g/dL (ref 3.5–5.2)
Alkaline Phosphatase: 65 U/L (ref 39–117)
BUN: 13 mg/dL (ref 6–23)
Calcium: 9.6 mg/dL (ref 8.4–10.5)
Creatinine, Ser: 0.5 mg/dL (ref 0.50–1.10)
GFR calc Af Amer: >90 mL/min (ref >90)
Glucose, Bld: 83 mg/dL (ref 70–99)
Potassium: 3.5 mEq/L (ref 3.5–5.1)
Total Protein: 7.6 g/dL (ref 6.0–8.3)

## 2012-01-18 LAB — POCT PREGNANCY, URINE: Preg Test, Ur: NEGATIVE

## 2012-01-18 LAB — POCT I-STAT TROPONIN I: Troponin i, poc: 0 ng/mL (ref 0.00–0.08)

## 2012-01-18 MED ORDER — IBUPROFEN 800 MG PO TABS: 800.0000 mg | ORAL_TABLET | Freq: Three times a day (TID) | ORAL | Status: DC

## 2012-01-18 MED ORDER — TRAMADOL HCL 50 MG PO TABS: 50.0000 mg | ORAL_TABLET | Freq: Four times a day (QID) | ORAL | Status: DC | PRN

## 2012-01-18 NOTE — ED Provider Notes (Signed)
History     CSN: [id number]  Arrival date & time 01/18/12  1330   First MD Initiated Contact with Patient 01/18/12 1729      Chief Complaint  Patient presents with  . Shoulder Pain  . Arm Pain  . Extremity Weakness    (Consider location/radiation/quality/duration/timing/severity/associated sxs/prior treatment) HPI Comments: Patient comes to the ER for evaluation of left shoulder pain with numbness and tingling in the hand. She reports that she is a one year course of pain in the neck which radiates to the back of the head. This pain worsens when she turns her head or talk on the phone. In the last month or so, however, she has noticed pain and left shoulder, this is progressively worsening has now become more severe. She says for 2 weeks she has been having numbness and tingling in the hand with certain movements and sometimes she cannot pick things up with the hand. She denies any injury.  Patient is a 41 y.o. female presenting with shoulder pain, arm pain, and extremity weakness.  Shoulder Pain  Arm Pain  Extremity Weakness    Past Medical History  Diagnosis Date  . Depression   . Stress headaches   . Pelvic pain   . Back pain     History reviewed. No pertinent past surgical history.  Family History  Problem Relation Age of Onset  . Depression Mother     History  Substance Use Topics  . Smoking status: Never Smoker   . Smokeless tobacco: Never Used  . Alcohol Use: No    OB History    Grav Para Term Preterm Abortions TAB SAB Ect Mult Living   4 3 3  1  1   3       Review of Systems  HENT: Positive for neck pain.   Musculoskeletal: Positive for arthralgias and extremity weakness.  Neurological: Positive for numbness.  All other systems reviewed and are negative.    Allergies  Review of patient's allergies indicates no known allergies.  Home Medications   Current Outpatient Rx  Name  Route  Sig  Dispense  Refill  . CALCIUM CARBONATE 600 MG PO  TABS   Oral   Take 600 mg by mouth 2 (two) times daily with a meal.         . VITAMIN D 1000 UNITS PO TABS   Oral   Take 1,000 Units by mouth 2 (two) times daily.         . CYCLOBENZAPRINE HCL 5 MG PO TABS   Oral   Take 1 tablet (5 mg total) by mouth 3 (three) times daily as needed for muscle spasms (headache).   30 tablet   0   . PRENATAL PLUS 27-1 MG PO TABS   Oral   Take 1 tablet by mouth daily.         Marland Kitchen PRESCRIPTION MEDICATION      1 tablet 2 (two) times daily. Patient states take 2 pills for back pain, not sure of name, given by [facility] MD         . SIMETHICONE 80 MG PO CHEW   Oral   Chew 1 tablet (80 mg total) by mouth every 6 (six) hours as needed for flatulence.   30 tablet   0     BP 115/87  Pulse 83  Temp 97.5 F (36.4 C) (Oral)  Resp 18  SpO2 99%  LMP 01/16/2012  Physical Exam  Constitutional: She is oriented to person,  place, and time. She appears well-developed and well-nourished. No distress.  HENT:  Head: Normocephalic and atraumatic.  Right Ear: Hearing normal.  Nose: Nose normal.  Mouth/Throat: Oropharynx is clear and moist and mucous membranes are normal.  Eyes: Conjunctivae normal and EOM are normal. Pupils are equal, round, and reactive to light.  Neck: Normal range of motion. Neck supple.  Cardiovascular: Normal rate, regular rhythm, S1 normal and S2 normal.  Exam reveals no gallop and no friction rub.   No murmur heard. Pulmonary/Chest: Effort normal and breath sounds normal. No respiratory distress. She exhibits no tenderness.  Abdominal: Soft. Normal appearance and bowel sounds are normal. There is no hepatosplenomegaly. There is no tenderness. There is no rebound, no guarding, no tenderness at McBurney's point and negative Murphy's sign. No hernia.  Musculoskeletal:       Left shoulder: She exhibits decreased range of motion, tenderness and pain. She exhibits no deformity and normal strength.       Severe tenderness in the  left shoulder diffusely with decreased range of motion, especially abduction and raising the arm. Has a painful in addition to exam, but no sensory or motor deficit.  Neurological: She is alert and oriented to person, place, and time. She has normal strength. No cranial nerve deficit or sensory deficit. Coordination normal. GCS eye subscore is 4. GCS verbal subscore is 5. GCS motor subscore is 6.  Skin: Skin is warm, dry and intact. No rash noted. No cyanosis.  Psychiatric: She has a normal mood and affect. Her speech is normal and behavior is normal. Thought content normal.    ED Course  Procedures (including critical care time)   Date: 01/18/2012  Rate: 70  Rhythm: normal sinus rhythm  QRS Axis: normal  Intervals: normal  ST/T Wave abnormalities: normal  Conduction Disutrbances:none  Narrative Interpretation:   Old EKG Reviewed: none available    Labs Reviewed  CBC WITH DIFFERENTIAL - Abnormal; Notable for the following:    WBC 3.9 (*)     All other components within normal limits  COMPREHENSIVE METABOLIC PANEL  POCT PREGNANCY, URINE  POCT I-STAT TROPONIN I   No results found.   1. Rotator cuff injury   2. Cervical radiculopathy       MDM  Patient comes to the ER with complaints of neck pain and left shoulder pain that has been somewhat chronic. She has had the shoulder pain for at least a month. She has severe tenderness in the area and pain with range of motion. Her range of motion is inhibited by the pain as well. I suspect 2 separate entities. She gives a history for 1 year of pain in the neck which radiates to the back of the head and down the arm with turning her head. This is consistent with cervical radiculopathy. Patient is now, however, developed severe left shoulder pain. There is diffuse tenderness in the shoulder and she cannot raise her arm. I suspect that this is related to rotator cuff issues.  She has a normal cardiac workup. The ER including troponin and  EKG. There is no motor or sensory deficit and this clearly is not due to CNS etiology. Patient will be referred to orthopedics for further evaluation of the shoulder and at that time can also be evaluated for cervical radiculopathy.        Gilda Crease, MD 01/18/12 [phone]

## 2012-01-18 NOTE — ED Notes (Signed)
Pt c/o left side pain from head down to left hand. Pt states the pain began in head and neck and has radiated down arm. Pt states for 2 weeks it has been hard to hold her telephone. Pain increases with any movement of left arm.

## 2012-01-18 NOTE — ED Notes (Signed)
[facility] EDP states to discontinue CT head.

## 2012-01-18 NOTE — ED Notes (Addendum)
Pt here with c/o left arm weakness that could not hold anything with left hand at times x5 moths, states weakness and pain in left arm to worse in last 2 weeks. Pt also c/o left sided neck pain that radiate left side of her head.

## 2012-02-09 ENCOUNTER — Other Ambulatory Visit: Payer: Self-pay | Admitting: Family Medicine

## 2012-02-09 DIAGNOSIS — N63 Unspecified lump in unspecified breast: Secondary | ICD-10-CM

## 2012-02-11 ENCOUNTER — Encounter: Payer: Self-pay | Admitting: Gastroenterology

## 2012-02-11 ENCOUNTER — Ambulatory Visit (INDEPENDENT_AMBULATORY_CARE_PROVIDER_SITE_OTHER): Payer: Medicaid Other | Admitting: Obstetrics and Gynecology

## 2012-02-11 ENCOUNTER — Encounter: Payer: Self-pay | Admitting: Obstetrics and Gynecology

## 2012-02-11 VITALS — BP 117/67 | HR 78 | Temp 97.0°F | Ht 64.0 in | Wt 192.2 lb

## 2012-02-11 DIAGNOSIS — G8929 Other chronic pain: Secondary | ICD-10-CM

## 2012-02-11 DIAGNOSIS — R1032 Left lower quadrant pain: Secondary | ICD-10-CM

## 2012-02-11 NOTE — Progress Notes (Signed)
Appointment scheduled with [facility] for 03/01/12 at 3 pm.

## 2012-02-11 NOTE — Progress Notes (Signed)
  Subjective:    Patient ID: Kristin Joseph, female    DOB: [birth year], 42 y.o.   MRN: [id number]  HPI 42 yo [id number] with LMP 02/08/2011 presenting today for evaluation of persistent abdominal pain. Patient reports pain has been present for the past year. The pain originates in her LLQ and radiates to her left flank.She states the pain is present when she walks or laugh. The pain is aggravated following a meal. Rest seems to be the only alleviating factor. She denies diarrhea/constipation. She reports having regular bowel movements. Patient reports normal cycles the last 2 months and she still desires pregnancy. Her husband is currently not living with her which makes attempts at conception more difficult. Other than this abdominal pain. Patient is without complaints.   Review of Systems  All other systems reviewed and are negative.       Objective:   Physical Exam  GENERAL: Well-developed, well-nourished female in no acute distress.  ABDOMEN: Soft, mild tenderness in LLQ, nondistended.  PELVIC: Normal external female genitalia. Vagina is pink and rugated.  Normal discharge. Normal appearing cervix. Uterus is normal in size. No adnexal mass or tenderness. EXTREMITIES: No cyanosis, clubbing, or edema, 2+ distal pulses.     Assessment & Plan:  42 yo with chronic LLQ abdominal pain of uncertain etiology - Patient has a normal pelvic ultrasound 2 months ago (November 2013) - Will refer to GI for evaluation - Advised patient to continue taking prenatal vitamins - Also advised to follow-up with REI as previously discussed - F/u mammography as scheduled in march  - RTC prn

## 2012-03-01 ENCOUNTER — Other Ambulatory Visit (INDEPENDENT_AMBULATORY_CARE_PROVIDER_SITE_OTHER): Payer: Medicaid Other

## 2012-03-01 ENCOUNTER — Ambulatory Visit (INDEPENDENT_AMBULATORY_CARE_PROVIDER_SITE_OTHER): Payer: Medicaid Other | Admitting: Gastroenterology

## 2012-03-01 ENCOUNTER — Encounter: Payer: Self-pay | Admitting: Gastroenterology

## 2012-03-01 VITALS — BP 100/60 | HR 72 | Ht 65.0 in | Wt 186.0 lb

## 2012-03-01 DIAGNOSIS — R1032 Left lower quadrant pain: Secondary | ICD-10-CM

## 2012-03-01 LAB — COMPREHENSIVE METABOLIC PANEL
AST: 15 U/L (ref 0–37)
Albumin: 4 g/dL (ref 3.5–5.2)
Alkaline Phosphatase: 55 U/L (ref 39–117)
Calcium: 9.6 mg/dL (ref 8.4–10.5)
Chloride: 101 mEq/L (ref 96–112)
Potassium: 3.7 mEq/L (ref 3.5–5.1)
Sodium: 139 mEq/L (ref 135–145)
Total Protein: 7.4 g/dL (ref 6.0–8.3)

## 2012-03-01 LAB — CBC WITH DIFFERENTIAL/PLATELET
Eosinophils Absolute: 0.1 10*3/uL (ref 0.0–0.7)
Eosinophils Relative: 1.5 % (ref 0.0–5.0)
Lymphocytes Relative: 42.7 % (ref 12.0–46.0)
MCV: 89 fl (ref 78.0–100.0)
Monocytes Absolute: 0.4 10*3/uL (ref 0.1–1.0)
Neutrophils Relative %: 47.2 % (ref 43.0–77.0)
Platelets: 292 10*3/uL (ref 150.0–400.0)
RBC: 4.16 Mil/uL (ref 3.87–5.11)
WBC: 4.6 10*3/uL (ref 4.5–10.5)

## 2012-03-01 NOTE — Progress Notes (Signed)
HPI: This is a   very pleasant 42 year old woman whom I am meeting for the first time. Her name is pronounced [place].  For past 2 years has had left sided abdominal pains.  The pain is suually left upper.  The pain is contstant. The pain is worse with walking.  Laughing, valsalva.  Needle like stabbing pain especially with valsalva.    Eating too much makes it worse, epecially heavy foods.  No relation to BMs.     she has no overt GI bleeding. She tells me that she has had some dysuria and some urinary incontinence as well recently. This has been going on for 2 months.  Review of systems: Pertinent positive and negative review of systems were noted in the above HPI section. Complete review of systems was performed and was otherwise normal.    Past Medical History  Diagnosis Date  . Depression   . Stress headaches   . Pelvic pain   . Back pain   . Vitamin D deficiency     Past Surgical History  Procedure Laterality Date  . Foot surgery Bilateral     Current Outpatient Prescriptions  Medication Sig Dispense Refill  . calcium carbonate (OS-CAL) 600 MG TABS Take 600 mg by mouth 2 (two) times daily with a meal.      . cholecalciferol (VITAMIN D) 1000 UNITS tablet Take 1,000 Units by mouth 2 (two) times daily.      . cyclobenzaprine (FLEXERIL) 5 MG tablet Take 5 mg by mouth 3 (three) times daily as needed. For muscle spasms      . ibuprofen (ADVIL,MOTRIN) 800 MG tablet Take 1 tablet (800 mg total) by mouth 3 (three) times daily.  21 tablet  0  . prenatal vitamin w/FE, FA (PRENATAL 1 + 1) 27-1 MG TABS Take 1 tablet by mouth daily.      Marland Kitchen PRESCRIPTION MEDICATION 1 tablet 2 (two) times daily. Patient states take 2 pills for back pain, not sure of name, given by [facility] MD      . simethicone (GAS-X) 80 MG chewable tablet Chew 1 tablet (80 mg total) by mouth every 6 (six) hours as needed for flatulence.  30 tablet  0  . traMADol (ULTRAM) 50 MG tablet Take 1 tablet (50 mg total) by mouth  every 6 (six) hours as needed for pain.  15 tablet  0   No current facility-administered medications for this visit.    Allergies as of 03/01/2012  . (No Known Allergies)    Family History  Problem Relation Age of Onset  . Depression Mother     History   Social History  . Marital Status: Single    Spouse Name: N/A    Number of Children: 3  . Years of Education: N/A   Occupational History  .     Social History Main Topics  . Smoking status: Never Smoker   . Smokeless tobacco: Never Used  . Alcohol Use: No  . Drug Use: No  . Sexually Active: Not Currently   Other Topics Concern  . Not on file   Social History Narrative  . No narrative on file       Physical Exam: BP 100/60  Pulse 72  Ht 5\' 5"  (1.651 m)  Wt 186 lb (84.369 kg)  BMI 30.95 kg/m2  LMP 02/16/2012 Constitutional: generally well-appearing Psychiatric: alert and oriented x3 Eyes: extraocular movements intact Mouth: oral pharynx moist, no lesions Neck: supple no lymphadenopathy Cardiovascular: heart regular rate and  rhythm Lungs: clear to auscultation bilaterally Abdomen: soft, mildly tender left lower quadrant, nondistended, no obvious ascites, no peritoneal signs, normal bowel sounds Extremities: no lower extremity edema bilaterally Skin: no lesions on visible extremities    Assessment and plan: 42 y.o. female with  left lower quadrant abdominal pain, dysuria  Unclear etiology of her pains, perhaps diverticulitis. She might have a urinary tract infection. Going to arrange CT scan abdomen and pelvis with IV and oral contrast as well as basic lab workup including CBC, complete metabolic profile and urinalysis.

## 2012-03-01 NOTE — Patient Instructions (Addendum)
You will be set up for a CT scan of abdomen and pelvis with IV and oral contrast for left sided abdominal pain. You have been scheduled for a CT scan of the abdomen and pelvis at [facility] ([address]---this is in the same building as [facility]).   You are scheduled on 03/07/12 at 230 pm. You should arrive 15 minutes prior to your appointment time for registration. Please follow the written instructions below on the day of your exam:  WARNING: IF YOU ARE ALLERGIC TO IODINE/X-RAY DYE, PLEASE NOTIFY RADIOLOGY IMMEDIATELY AT [phone]! YOU WILL BE GIVEN A 13 HOUR PREMEDICATION PREP.  1) Do not eat or drink anything after 1030 am (4 hours prior to your test) 2) You have been given 2 bottles of oral contrast to drink. The solution may taste better if refrigerated, but do NOT add ice or any other liquid to this solution. Shake well before drinking.    Drink 1 bottle of contrast @ 1230 pm (2 hours prior to your exam)  Drink 1 bottle of contrast @ 130 pm (1 hour prior to your exam)  You may take any medications as prescribed with a small amount of water except for the following: Metformin, Glucophage, Glucovance, Avandamet, Riomet, Fortamet, Actoplus Met, Janumet, Glumetza or Metaglip. The above medications must be held the day of the exam AND 48 hours after the exam.  The purpose of you drinking the oral contrast is to aid in the visualization of your intestinal tract. The contrast solution may cause some diarrhea. Before your exam is started, you will be given a small amount of fluid to drink. Depending on your individual set of symptoms, you may also receive an intravenous injection of x-ray contrast/dye. Plan on being at [facility] for 30 minutes or long, depending on the type of exam you are having performed.  This test typically takes 30-45 minutes to complete.  If you have any questions regarding your exam or if you need to reschedule, you may call the CT  department at [phone] between the hours of 8:00 am and 5:00 pm, Monday-Friday.  ________________________________________________________________________  Bonita Quin will have labs checked today in the basement lab.  Please head down after you check out with the front desk  (cbc, cmet, urinalysis and urine culture)

## 2012-03-07 ENCOUNTER — Ambulatory Visit (INDEPENDENT_AMBULATORY_CARE_PROVIDER_SITE_OTHER)
Admission: RE | Admit: 2012-03-07 | Discharge: 2012-03-07 | Disposition: A | Discharge status: Home / Self Care | Payer: Medicaid Other | Source: Ambulatory Visit | Attending: Gastroenterology | Admitting: Gastroenterology

## 2012-03-07 DIAGNOSIS — R1032 Left lower quadrant pain: Secondary | ICD-10-CM

## 2012-03-07 MED ORDER — IOHEXOL 300 MG/ML  SOLN
100.0000 mL | Freq: Once | INTRAMUSCULAR | Status: AC | PRN
  Administered 2012-03-07: 100 mL via INTRAVENOUS

## 2012-03-18 ENCOUNTER — Other Ambulatory Visit: Payer: Self-pay | Admitting: Internal Medicine

## 2012-03-22 ENCOUNTER — Other Ambulatory Visit: Payer: Medicaid Other

## 2012-04-07 ENCOUNTER — Emergency Department (HOSPITAL_COMMUNITY)
Admission: EM | Admit: 2012-04-07 | Discharge: 2012-04-07 | Disposition: A | Discharge status: Home / Self Care | Payer: Medicaid Other | Source: Home / Self Care

## 2012-04-07 ENCOUNTER — Encounter (HOSPITAL_COMMUNITY): Payer: Self-pay

## 2012-04-07 LAB — POCT I-STAT, CHEM 8
BUN: 10 mg/dL (ref 6–23)
Calcium, Ion: 1.15 mmol/L (ref 1.12–1.23)
Creatinine, Ser: 0.6 mg/dL (ref 0.50–1.10)
Glucose, Bld: 77 mg/dL (ref 70–99)
TCO2: 31 mmol/L (ref 0–100)

## 2012-04-07 NOTE — ED Notes (Signed)
Follow up shoulder pain

## 2012-04-07 NOTE — ED Notes (Signed)
Patient Demographics  Kristin Joseph, is a 42 y.o. female  ZOX:[id number]  [id number]  DOB - 07-Nov-1970  Chief Complaint  Patient presents with  . Shoulder Pain        Subjective:   Kristin Joseph your C-spine pain rating of the left arm comes for followup visit, she continues to have C-spine neck pain radiating down her left arm with some left arm weakness, pain radiates all the way down to her left arm, worsened by movement and lifting weights better with rest, no fever chills, no neck injuries.  Objective:    Filed Vitals:   04/07/12 1231  BP: 120/80  Pulse: 94  Temp: 98.1 F (36.7 C)  TempSrc: Oral  SpO2: 98%     Exam  Awake Alert, Oriented X 3, No new F.N deficits, Normal affect, strength is 5 x 5 in both upper admitted [place].AT,PERRAL Supple Neck,No JVD, No cervical lymphadenopathy appriciated. No tenderness on C-spine, no obvious step in deformity on exam good range of motion. Symmetrical Chest wall movement, Good air movement bilaterally, CTAB RRR,No Gallops,Rubs or new Murmurs, No Parasternal Heave +ve B.Sounds, Abd Soft, Non tender, No organomegaly appriciated, No rebound - guarding or rigidity. No Cyanosis, Clubbing or edema, No new Rash or bruise      Data Review   CBC No results found for this basename: WBC, HGB, HCT, PLT, MCV, MCH, MCHC, RDW, NEUTRABS, LYMPHSABS, MONOABS, EOSABS, BASOSABS, BANDABS, BANDSABD,  in the last 168 hours  Chemistries   No results found for this basename: NA, K, CL, CO2, GLUCOSE, BUN, CREATININE, GFRCGP, CALCIUM, MG, AST, ALT, ALKPHOS, BILITOT,  in the last 168 hours ------------------------------------------------------------------------------------------------------------------ No results found for this basename: HGBA1C,  in the last 72 hours ------------------------------------------------------------------------------------------------------------------ No results found for this basename: CHOL, HDL, LDLCALC, TRIG,  CHOLHDL, LDLDIRECT,  in the last 72 hours ------------------------------------------------------------------------------------------------------------------ No results found for this basename: TSH, T4TOTAL, FREET3, T3FREE, THYROIDAB,  in the last 72 hours ------------------------------------------------------------------------------------------------------------------ No results found for this basename: VITAMINB12, FOLATE, FERRITIN, TIBC, IRON, RETICCTPCT,  in the last 72 hours  Coagulation profile  No results found for this basename: INR, PROTIME,  in the last 168 hours     Prior to Admission medications   Medication Sig Start Date End Date Taking? Authorizing Provider  calcium carbonate (OS-CAL) 600 MG TABS Take 600 mg by mouth 2 (two) times daily with a meal.    Historical Provider, MD  cholecalciferol (VITAMIN D) 1000 UNITS tablet Take 1,000 Units by mouth 2 (two) times daily.    Historical Provider, MD  cyclobenzaprine (FLEXERIL) 5 MG tablet Take 5 mg by mouth 3 (three) times daily as needed. For muscle spasms 12/11/11   Leroy Sea, MD  ibuprofen (ADVIL,MOTRIN) 800 MG tablet Take 1 tablet (800 mg total) by mouth 3 (three) times daily. 01/18/12   Gilda Crease, MD  prenatal vitamin w/FE, FA (PRENATAL 1 + 1) 27-1 MG TABS Take 1 tablet by mouth daily.    Historical Provider, MD  PRESCRIPTION MEDICATION 1 tablet 2 (two) times daily. Patient states take 2 pills for back pain, not sure of name, given by [facility] MD    Historical Provider, MD  simethicone (GAS-X) 80 MG chewable tablet Chew 1 tablet (80 mg total) by mouth every 6 (six) hours as needed for flatulence. 10/02/11 03/01/12  Deirdre Colin Mulders, CNM  traMADol (ULTRAM) 50 MG tablet Take 1 tablet (50 mg total) by mouth every 6 (six) hours as needed for pain. 01/18/12   Cristal Deer  J. Pollina, MD     Assessment & Plan   C-spine neck pain radiating down the left arm with some tingling numbness and some intermittent weakness.  Have ordered MRI of C-spine, will check a BMP and pregnancy test prior to that, outpatient followup with neurosurgeon Dr. Lovell Sheehan, continue supportive care as above.   Diffuse erosion of her nails, this looks like chronic fungal infection, outpatient dermatology followup has been recommended. Patient has been provided papers for both neurosurgery and dermatology.     Follow-up Information   Follow up with this clinic In 1 month.      Follow up with Cristi Loron, MD. Schedule an appointment as soon as possible for a visit in 2 weeks.   Contact information:   [address] [place] [address] [phone]        Leroy Sea M.D on 04/07/2012 at 2:05 PM   Leroy Sea, MD 04/07/12 [phone]

## 2012-04-08 ENCOUNTER — Telehealth: Payer: Self-pay | Admitting: Gastroenterology

## 2012-04-08 NOTE — Telephone Encounter (Signed)
Pt called to set up EGD pt to set up appt with [facility]

## 2012-04-11 ENCOUNTER — Telehealth: Payer: Self-pay

## 2012-04-11 ENCOUNTER — Ambulatory Visit (AMBULATORY_SURGERY_CENTER): Payer: Medicaid Other

## 2012-04-11 VITALS — Ht 66.0 in | Wt 188.0 lb

## 2012-04-11 DIAGNOSIS — E279 Disorder of adrenal gland, unspecified: Secondary | ICD-10-CM

## 2012-04-11 DIAGNOSIS — R1032 Left lower quadrant pain: Secondary | ICD-10-CM

## 2012-04-11 NOTE — Telephone Encounter (Signed)
Pt has been scheduled for MRI as requested by the pt information mailed to the home   You have been scheduled for an MRI at [facility]  on 04/25/12. Your appointment time is 12 noon. Please arrive 15 minutes prior to your appointment time for registration purposes. You should have nothing to eat or drink 4 hours prior to the test . If you have any metal in your body, have a pacemaker or defibrillator, please be sure to let your ordering physician know. This test typically takes 45 minutes to 1 hour to complete.

## 2012-04-13 ENCOUNTER — Other Ambulatory Visit: Payer: Self-pay

## 2012-04-15 ENCOUNTER — Telehealth: Payer: Self-pay | Admitting: Gastroenterology

## 2012-04-15 NOTE — Telephone Encounter (Signed)
Pt has appt change for EGD I will mail a copy of the new instructions as well as the MRI instructions

## 2012-04-15 NOTE — Telephone Encounter (Signed)
Pt information has been sent to the patient

## 2012-04-18 ENCOUNTER — Other Ambulatory Visit: Payer: Medicaid Other | Admitting: Gastroenterology

## 2012-04-21 ENCOUNTER — Encounter (HOSPITAL_COMMUNITY): Payer: Self-pay | Admitting: *Deleted

## 2012-04-21 ENCOUNTER — Emergency Department (HOSPITAL_COMMUNITY)
Admission: EM | Admit: 2012-04-21 | Discharge: 2012-04-21 | Disposition: A | Discharge status: Home / Self Care | Payer: Medicaid Other | Source: Home / Self Care

## 2012-04-21 DIAGNOSIS — M542 Cervicalgia: Secondary | ICD-10-CM

## 2012-04-21 LAB — TSH: TSH: 2.005 u[IU]/mL (ref 0.350–4.500)

## 2012-04-21 NOTE — ED Notes (Signed)
Patient present with left arm pain , neck And backwith swelling in hand and knees

## 2012-04-21 NOTE — ED Notes (Signed)
Patient refused urine pregnancy test

## 2012-04-21 NOTE — ED Notes (Addendum)
Patient Demographics  Kristin Joseph, is a 42 y.o. female  UUV:[id number]  [id number]  DOB - [birth year]  Chief Complaint  Patient presents with  . Arm Pain        Subjective:   Kristin Joseph is here for her C-spine pain radiating to her left arm followup, problem has been there for several months, I have ordered MRI of her C-spine which she was unable to get them last time due to insurance reasons, she was also requested to follow with neurosurgeon which she failed to do. No new weakness in the left arm, no change in the pattern of C-spine pain.  Objective:   Past Medical History  Diagnosis Date  . Depression   . Stress headaches   . Pelvic pain   . Back pain   . Vitamin D deficiency       Past Surgical History  Procedure Laterality Date  . Foot surgery Bilateral      Filed Vitals:   04/21/12 1545  BP: 119/56  Pulse: 89  Temp: 97.9 F (36.6 C)  TempSrc: Oral  Resp: 17  SpO2: 100%     Exam  Awake Alert, Oriented X 3, No new F.N deficits, Normal affect, good range of motion in C-spine. No point tenderness.   [place].AT,PERRAL Supple Neck,No JVD, No cervical lymphadenopathy appriciated.  Symmetrical Chest wall movement, Good air movement bilaterally, CTAB RRR,No Gallops,Rubs or new Murmurs, No Parasternal Heave +ve B.Sounds, Abd Soft, Non tender, No organomegaly appriciated, No rebound - guarding or rigidity. No Cyanosis, Clubbing or edema, No new Rash or bruise       Data Review   CBC No results found for this basename: WBC, HGB, HCT, PLT, MCV, MCH, MCHC, RDW, NEUTRABS, LYMPHSABS, MONOABS, EOSABS, BASOSABS, BANDABS, BANDSABD,  in the last 168 hours  Chemistries   No results found for this basename: NA, K, CL, CO2, GLUCOSE, BUN, CREATININE, GFRCGP, CALCIUM, MG, AST, ALT, ALKPHOS, BILITOT,  in the last 168 hours ------------------------------------------------------------------------------------------------------------------ No results found for this  basename: HGBA1C,  in the last 72 hours ------------------------------------------------------------------------------------------------------------------ No results found for this basename: CHOL, HDL, LDLCALC, TRIG, CHOLHDL, LDLDIRECT,  in the last 72 hours ------------------------------------------------------------------------------------------------------------------ No results found for this basename: TSH, T4TOTAL, FREET3, T3FREE, THYROIDAB,  in the last 72 hours ------------------------------------------------------------------------------------------------------------------ No results found for this basename: VITAMINB12, FOLATE, FERRITIN, TIBC, IRON, RETICCTPCT,  in the last 72 hours  Coagulation profile  No results found for this basename: INR, PROTIME,  in the last 168 hours     Prior to Admission medications   Medication Sig Start Date End Date Taking? Authorizing Provider  calcium carbonate (OS-CAL) 600 MG TABS Take 600 mg by mouth 2 (two) times daily with a meal.    Historical Provider, MD  cholecalciferol (VITAMIN D) 1000 UNITS tablet Take 1,000 Units by mouth 2 (two) times daily.    Historical Provider, MD  cyclobenzaprine (FLEXERIL) 5 MG tablet Take 5 mg by mouth 3 (three) times daily as needed. For muscle spasms 12/11/11   Leroy Sea, MD  ibuprofen (ADVIL,MOTRIN) 800 MG tablet Take 1 tablet (800 mg total) by mouth 3 (three) times daily. 01/18/12   Gilda Crease, MD  prenatal vitamin w/FE, FA (PRENATAL 1 + 1) 27-1 MG TABS Take 1 tablet by mouth daily.    Historical Provider, MD  PRESCRIPTION MEDICATION 1 tablet 2 (two) times daily. Patient states take 2 pills for back pain, not sure of name, given by [facility] MD    Historical Provider,  MD  simethicone (GAS-X) 80 MG chewable tablet Chew 1 tablet (80 mg total) by mouth every 6 (six) hours as needed for flatulence. 10/02/11 03/01/12  Deirdre Colin Mulders, CNM  traMADol (ULTRAM) 50 MG tablet Take 1 tablet (50 mg total) by  mouth every 6 (six) hours as needed for pain. 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 She is been requested to discuss this with our Engineer, manufacturing. Will then tramadol along with Flexeril will be continued.  She says that generally she feels quite cold all the time, afebrile here, will check TSH.  Follow-up Information   Schedule an appointment as soon as possible for a visit in 1 month to follow up.      Follow up with HIRSCH,JAMES R, MD. Schedule an appointment as soon as possible for a visit in 2 weeks.   Contact information:   [address] [place] [address] [phone]        Leroy Sea M.D on 04/21/2012 at 3:54 PM   Leroy Sea, MD 04/21/12 1556  Leroy Sea, MD 04/21/12 [phone]

## 2012-04-22 ENCOUNTER — Ambulatory Visit
Admission: RE | Admit: 2012-04-22 | Discharge: 2012-04-22 | Disposition: A | Discharge status: Home / Self Care | Payer: Medicaid Other | Source: Ambulatory Visit | Attending: Family Medicine | Admitting: Family Medicine

## 2012-04-22 ENCOUNTER — Ambulatory Visit
Admission: RE | Admit: 2012-04-22 | Discharge: 2012-04-22 | Disposition: A | Discharge status: Home / Self Care | Payer: Medicaid Other | Source: Ambulatory Visit | Attending: Internal Medicine | Admitting: Internal Medicine

## 2012-04-22 DIAGNOSIS — N63 Unspecified lump in unspecified breast: Secondary | ICD-10-CM

## 2012-04-25 ENCOUNTER — Other Ambulatory Visit: Payer: Self-pay | Admitting: Gastroenterology

## 2012-04-25 ENCOUNTER — Ambulatory Visit (HOSPITAL_COMMUNITY)
Admission: RE | Admit: 2012-04-25 | Discharge: 2012-04-25 | Disposition: A | Discharge status: Home / Self Care | Payer: Medicaid Other | Source: Ambulatory Visit | Attending: Gastroenterology | Admitting: Gastroenterology

## 2012-04-25 DIAGNOSIS — E279 Disorder of adrenal gland, unspecified: Secondary | ICD-10-CM

## 2012-04-25 DIAGNOSIS — K8020 Calculus of gallbladder without cholecystitis without obstruction: Secondary | ICD-10-CM | POA: Insufficient documentation

## 2012-04-29 ENCOUNTER — Encounter (HOSPITAL_COMMUNITY): Payer: Self-pay | Admitting: Emergency Medicine

## 2012-04-29 ENCOUNTER — Emergency Department (HOSPITAL_COMMUNITY)
Admission: EM | Admit: 2012-04-29 | Discharge: 2012-04-29 | Disposition: A | Discharge status: Home / Self Care | Payer: Medicaid Other | Source: Home / Self Care | Attending: Emergency Medicine | Admitting: Emergency Medicine

## 2012-04-29 DIAGNOSIS — B372 Candidiasis of skin and nail: Secondary | ICD-10-CM

## 2012-04-29 MED ORDER — FLUCONAZOLE 150 MG PO TABS: 150.0000 mg | ORAL_TABLET | Freq: Once | ORAL | Status: DC

## 2012-04-29 MED ORDER — BETAMETHASONE DIPROPIONATE 0.05 % EX OINT: TOPICAL_OINTMENT | Freq: Two times a day (BID) | CUTANEOUS | Status: DC

## 2012-04-29 NOTE — ED Notes (Signed)
No answer x1 in lobby

## 2012-04-29 NOTE — ED Notes (Signed)
Clarified medication instructions

## 2012-04-29 NOTE — ED Provider Notes (Signed)
Chief Complaint:   Chief Complaint  Patient presents with  . Skin Problem    History of Present Illness:   Kristin Joseph is a 42 year old female who had a one-year history of redness, swelling, and itching of the nail full areas of all her fingers of both hands with the exception of the thumbs. The nails are distorted and discolored. This is very pruritic and sometimes she finds herself scratching at nighttime. The patient did have a rash on her neck, bilateral groin area and legs, but this has cleared up. She denies any other skin problems.  Review of Systems:  Other than noted above, the patient denies any of the following symptoms: Systemic:  No fever, chills, sweats, weight loss, or fatigue. ENT:  No nasal congestion, rhinorrhea, sore throat, swelling of lips, tongue or throat. Resp:  No cough, wheezing, or shortness of breath. Skin:  No rash, itching, nodules, or suspicious lesions.  PMFSH:  Past medical history, family history, social history, meds, and allergies were reviewed.   Physical Exam:   Vital signs:  BP 94/70  Pulse 72  Temp(Src) 98.2 F (36.8 C) (Oral)  Resp 16  SpO2 100%  LMP 03/28/2012 Gen:  Alert, oriented, in no distress. ENT:  Pharynx clear, no intraoral lesions, moist mucous membranes. Lungs:  Clear to auscultation. Skin:  There is erythema and swelling of the nail folds of all of her fingers except the thumbs on both hands. The fingernails are distorted and discolored.  Assessment:  The encounter diagnosis was Candidal paronychia.  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 This usually responds best to steroid creams, but may take a long time to clear up. The patient states that her mother had the same thing and took a week of antifungal pills which did seem to help.  Plan:   1.  The following meds were prescribed:   Discharge Medication List as of 04/29/2012  3:04 PM    START taking these medications   Details  betamethasone dipropionate  (DIPROLENE) 0.05 % ointment Apply topically 2 (two) times daily., Starting 04/29/2012, Until Discontinued, Normal    fluconazole (DIFLUCAN) 150 MG tablet Take 1 tablet (150 mg total) by mouth once., Starting 04/29/2012, Normal       2.  The patient was instructed in symptomatic care and handouts were given. 3.  The patient was told to return if becoming worse in any way, if no better in 3 or 4 days, and given some red flag symptoms such as rash elsewhere on her body that would indicate earlier return.     Reuben Likes, MD 04/29/12 [phone]

## 2012-04-29 NOTE — ED Notes (Signed)
Patient in restroom.

## 2012-04-29 NOTE — ED Notes (Signed)
Reports history of the same.  Patient concerned for fingernails, layered appearance.

## 2012-05-03 ENCOUNTER — Telehealth: Payer: Self-pay | Admitting: Gastroenterology

## 2012-05-03 NOTE — Telephone Encounter (Signed)
Pt was asking to be seen tomorrow by Dr Christella Hartigan.  Pt was notified that Dr Christella Hartigan is out of the office all week.  I asked why she needed to be seen because she has an EGD already scheduled.  She says she has pain when laughs or moves in her sides and back.  I advised the pt she would need to call Dr  Thedore Mins her PCP for complaints other than GI related.Pt does not understand very well because of language barrier, I asked her to verbalize the instructions and she did so and will call her PCP and will keep her EGD appt

## 2012-05-04 ENCOUNTER — Emergency Department (HOSPITAL_COMMUNITY)
Admission: EM | Admit: 2012-05-04 | Discharge: 2012-05-04 | Disposition: A | Discharge status: Home / Self Care | Payer: Medicaid Other | Source: Home / Self Care | Attending: Family Medicine | Admitting: Family Medicine

## 2012-05-04 ENCOUNTER — Emergency Department (INDEPENDENT_AMBULATORY_CARE_PROVIDER_SITE_OTHER): Payer: Medicaid Other

## 2012-05-04 ENCOUNTER — Encounter (HOSPITAL_COMMUNITY): Payer: Self-pay | Admitting: Emergency Medicine

## 2012-05-04 ENCOUNTER — Telehealth (HOSPITAL_COMMUNITY): Payer: Self-pay | Admitting: Emergency Medicine

## 2012-05-04 DIAGNOSIS — S60222A Contusion of left hand, initial encounter: Secondary | ICD-10-CM

## 2012-05-04 DIAGNOSIS — S60229A Contusion of unspecified hand, initial encounter: Secondary | ICD-10-CM

## 2012-05-04 DIAGNOSIS — K589 Irritable bowel syndrome without diarrhea: Secondary | ICD-10-CM

## 2012-05-04 MED ORDER — BETAMETHASONE VALERATE 0.1 % EX CREA: TOPICAL_CREAM | Freq: Two times a day (BID) | CUTANEOUS | Status: DC

## 2012-05-04 MED ORDER — ALIGN 4 MG PO CAPS: 4.0000 mg | ORAL_CAPSULE | Freq: Every morning | ORAL | Status: DC

## 2012-05-04 NOTE — ED Notes (Signed)
Assisted with patient exam

## 2012-05-04 NOTE — ED Provider Notes (Signed)
History     CSN: [id number]  Arrival date & time 05/04/12  1440   First MD Initiated Contact with Patient 05/04/12 [phone]      Chief Complaint  Patient presents with  . Abdominal Pain    (Consider location/radiation/quality/duration/timing/severity/associated sxs/prior treatment) Patient is a 42 y.o. female presenting with abdominal pain. The history is provided by the patient.  Abdominal Pain Pain location:  LLQ Pain quality: sharp   Pain radiates to:  Does not radiate Duration:  2 weeks Timing:  Intermittent Progression:  Unchanged Chronicity:  Recurrent Context comment:  Seen by dr Christella Hartigan for same, had neg mri on 4/7, pt has report, no rx given, also with left hand pain and finger rash not treated from visit 4/11 b/o med too costly. Associated symptoms: no constipation, no diarrhea, no nausea and no vomiting     Past Medical History  Diagnosis Date  . Depression   . Stress headaches   . Pelvic pain   . Back pain   . Vitamin D deficiency     Past Surgical History  Procedure Laterality Date  . Foot surgery Bilateral     Family History  Problem Relation Age of Onset  . Depression Mother   . Colon cancer Neg Hx     History  Substance Use Topics  . Smoking status: Never Smoker   . Smokeless tobacco: Never Used  . Alcohol Use: No    OB History   Grav Para Term Preterm Abortions TAB SAB Ect Mult Living   4 3 3  1  1   3       Review of Systems  Constitutional: Negative.   Gastrointestinal: Positive for abdominal pain. Negative for nausea, vomiting, diarrhea, constipation, blood in stool and anal bleeding.  Musculoskeletal: Negative.   Skin: Negative.     Allergies  Review of patient's allergies indicates no known allergies.  Home Medications   Current Outpatient Rx  Name  Route  Sig  Dispense  Refill  . betamethasone dipropionate (DIPROLENE) 0.05 % ointment   Topical   Apply topically 2 (two) times daily.   30 g   5   . betamethasone valerate  (VALISONE) 0.1 % cream   Topical   Apply topically 2 (two) times daily.   30 g   5   . calcium carbonate (OS-CAL) 600 MG TABS   Oral   Take 600 mg by mouth 2 (two) times daily with a meal.         . cholecalciferol (VITAMIN D) 1000 UNITS tablet   Oral   Take 1,000 Units by mouth 2 (two) times daily.         . cyclobenzaprine (FLEXERIL) 5 MG tablet   Oral   Take 5 mg by mouth 3 (three) times daily as needed. For muscle spasms         . fluconazole (DIFLUCAN) 150 MG tablet   Oral   Take 1 tablet (150 mg total) by mouth once.   7 tablet   0   . ibuprofen (ADVIL,MOTRIN) 800 MG tablet   Oral   Take 1 tablet (800 mg total) by mouth 3 (three) times daily.   21 tablet   0   . prenatal vitamin w/FE, FA (PRENATAL 1 + 1) 27-1 MG TABS   Oral   Take 1 tablet by mouth daily.         Marland Kitchen PRESCRIPTION MEDICATION      1 tablet 2 (two) times daily. Patient states  take 2 pills for back pain, not sure of name, given by [facility] MD         . Probiotic Product (ALIGN) 4 MG CAPS   Oral   Take 4 mg by mouth every morning.   30 capsule   1   . EXPIRED: simethicone (GAS-X) 80 MG chewable tablet   Oral   Chew 1 tablet (80 mg total) by mouth every 6 (six) hours as needed for flatulence.   30 tablet   0   . traMADol (ULTRAM) 50 MG tablet   Oral   Take 1 tablet (50 mg total) by mouth every 6 (six) hours as needed for pain.   15 tablet   0     BP 109/59  Pulse 80  Temp(Src) 98.2 F (36.8 C) (Oral)  Resp 20  SpO2 100%  LMP 04/30/2012  Physical Exam  Nursing note and vitals reviewed. Constitutional: She is oriented to person, place, and time. She appears well-developed and well-nourished. No distress.  Abdominal: Soft. Bowel sounds are normal. She exhibits no distension and no mass. There is tenderness in the left lower quadrant. There is no rigidity, no rebound, no guarding, no CVA tenderness, no tenderness at McBurney's point and negative Murphy's sign.   Neurological: She is alert and oriented to person, place, and time.  Skin: Skin is warm and dry.    ED Course  Procedures (including critical care time)  Labs Reviewed - No data to display Dg Hand Complete Left  05/04/2012  *RADIOLOGY REPORT*  Clinical Data: Fall.  Left hand pain.  LEFT HAND - COMPLETE 3+ VIEW  Comparison: None.  Findings: Three-view exam shows no fracture.  No subluxation or dislocation.  No worrisome lytic or sclerotic osseous abnormality.  IMPRESSION: No acute bony findings.   Original Report Authenticated By: Kennith Center, M.D.      1. IBS (irritable bowel syndrome)   2. Hand contusion, left, initial encounter       MDM          Linna Hoff, MD 05/04/12 [phone]

## 2012-05-04 NOTE — ED Notes (Addendum)
Pt is here for lower left side abd pain onset 2 weeks Recently had an MRI of abd here at [facility] Seen by the Adult Clinic for this Pain increases w/activity Denies: f/v/n/d, constipation  She is alert and oriented w/no signs of acute distress.  C/o of many multiple prob during visit.

## 2012-05-04 NOTE — ED Notes (Signed)
Vitals obtained by xray student 

## 2012-05-04 NOTE — ED Notes (Signed)
Medicaid would not pay for the betamethasone valerate ointment that I prescribed last week, it will pay for the 0.1% cream, so we'll go ahead and send this into her pharmacy.  Reuben Likes, MD 05/04/12 [phone]

## 2012-05-19 NOTE — ED Notes (Signed)
[facility] tried contacting patient to schedule an appt-phone is disconnected

## 2012-05-30 ENCOUNTER — Other Ambulatory Visit: Payer: Medicaid Other | Admitting: Gastroenterology

## 2012-06-10 ENCOUNTER — Ambulatory Visit (INDEPENDENT_AMBULATORY_CARE_PROVIDER_SITE_OTHER): Payer: Medicaid Other | Admitting: Obstetrics & Gynecology

## 2012-06-10 ENCOUNTER — Encounter: Payer: Self-pay | Admitting: Obstetrics & Gynecology

## 2012-06-10 VITALS — BP 95/65 | HR 75 | Temp 97.5°F | Ht 66.5 in | Wt 189.3 lb

## 2012-06-10 DIAGNOSIS — R102 Pelvic and perineal pain: Secondary | ICD-10-CM

## 2012-06-10 DIAGNOSIS — N949 Unspecified condition associated with female genital organs and menstrual cycle: Secondary | ICD-10-CM

## 2012-06-10 LAB — POCT PREGNANCY, URINE: Preg Test, Ur: NEGATIVE

## 2012-06-10 MED ORDER — IBUPROFEN 800 MG PO TABS: 800.0000 mg | ORAL_TABLET | Freq: Three times a day (TID) | ORAL | Status: DC | PRN

## 2012-06-10 MED ORDER — IBUPROFEN 800 MG PO TABS: 800.0000 mg | ORAL_TABLET | Freq: Three times a day (TID) | ORAL | Status: DC

## 2012-06-10 NOTE — Progress Notes (Signed)
Pt requesting pregnancy test however does not have sx of pregnancy such as nausea and breast tenderness.  Last UPT on 04/07/12 was neg.

## 2012-06-10 NOTE — Patient Instructions (Addendum)
Pelvic Pain, Female °Female pelvic pain can be caused by many different things and start from a variety of places. Pelvic pain refers to pain that is located in the lower half of the abdomen and between your hips. The pain may occur over a short period of time (acute) or may be reoccurring (chronic). The cause of pelvic pain may be related to disorders affecting the female reproductive organs (gynecologic), but it may also be related to the bladder, kidney stones, an intestinal complication, or muscle or skeletal problems. Getting help right away for pelvic pain is important, especially if there has been severe, sharp, or a sudden onset of unusual pain. It is also important to get help right away because some types of pelvic pain can be life threatening.  °CAUSES  °Below are only some of the causes of pelvic pain. The causes of pelvic pain can be in one of several categories.  °· Gynecologic. °· Pelvic inflammatory disease. °· Sexually transmitted infection. °· Ovarian cyst or a twisted ovarian ligament (ovarian torsion). °· Uterine lining that grows outside the uterus (endometriosis). °· Fibroids, cysts, or tumors. °· Ovulation. °· Pregnancy. °· Pregnancy that occurs outside the uterus (ectopic pregnancy). °· Miscarriage. °· Labor. °· Abruption of the placenta or ruptured uterus. °· Infection. °· Uterine infection (endometritis). °· Bladder infection. °· Diverticulitis. °· Miscarriage related to a uterine infection (septic abortion). °· Bladder. °· Inflammation of the bladder (cystitis). °· Kidney stone(s). °· Gastrointenstinal. °· Constipation. °· Diverticulitis. °· Neurologic. °· Trauma. °· Feeling pelvic pain because of mental or emotional causes (psychosomatic). °· Cancers of the bowel or pelvis. °EVALUATION  °Your caregiver will want to take a careful history of your concerns. This includes recent changes in your health, a careful gynecologic history of your periods (menses), and a sexual history. Obtaining  your family history and medical history is also important. Your caregiver may suggest a pelvic exam. A pelvic exam will help identify the location and severity of the pain. It also helps in the evaluation of which organ system may be involved. In order to identify the cause of the pelvic pain and be properly treated, your caregiver may order tests. These tests may include:  °· A pregnancy test. °· Pelvic ultrasonography. °· An X-ray exam of the abdomen. °· A urinalysis or evaluation of vaginal discharge. °· Blood tests. °HOME CARE INSTRUCTIONS  °· Only take over-the-counter or prescription medicines for pain, discomfort, or fever as directed by your caregiver.   °· Rest as directed by your caregiver.   °· Eat a balanced diet.   °· Drink enough fluids to make your urine clear or pale yellow, or as directed.   °· Avoid sexual intercourse if it causes pain.   °· Apply warm or cold compresses to the lower abdomen depending on which one helps the pain.   °· Avoid stressful situations.   °· Keep a journal of your pelvic pain. Write down when it started, where the pain is located, and if there are things that seem to be associated with the pain, such as food or your menstrual cycle. °· Follow up with your caregiver as directed.   °SEEK MEDICAL CARE IF: °· Your medicine does not help your pain. °· You have abnormal vaginal discharge. °SEEK IMMEDIATE MEDICAL CARE IF:  °· You have heavy bleeding from the vagina.   °· Your pelvic pain increases.   °· You feel lightheaded or faint.   °· You have chills.   °· You have pain with urination or blood in your urine.   °· You have uncontrolled   diarrhea or vomiting.   °· You have a fever or persistent symptoms for more than 3 days. °· You have a fever and your symptoms suddenly get worse.   °· You are being physically or sexually abused.   °MAKE SURE YOU: °· Understand these instructions. °· Will watch your condition. °· Will get help if you are not doing well or get worse. °Document  Released: 12/03/2003 Document Revised: 07/07/2011 Document Reviewed: 04/27/2011 °[employer]® Patient Information ©2014 [employer]. ° °

## 2012-06-10 NOTE — Progress Notes (Signed)
Subjective:     Patient ID: Kristin Joseph, female   DOB: 06/16/1970, 42 y.o.   MRN: [id number]  HPI Pt c/o 2 years of pelvic pain.  She had a sono in 2013 which was found to be negative except for 1 small fibroid.  She reports that the pain is greater with walking and/or standing.  She reports that she has chronic back pain on a daily basis. She denies bowel or bladder complaints.  The pain is slightly worse with her menses.  It was previously improved with Motrin. LMP was in Feb.  Pt not sure if she is pregnant or not.         Past Medical History  Diagnosis Date  . Depression   . Stress headaches   . Pelvic pain   . Back pain   . Vitamin D deficiency    Past Surgical History  Procedure Laterality Date  . Foot surgery Bilateral    Current Outpatient Prescriptions on File Prior to Visit  Medication Sig Dispense Refill  . betamethasone dipropionate (DIPROLENE) 0.05 % ointment Apply topically 2 (two) times daily.  30 g  5  . betamethasone valerate (VALISONE) 0.1 % cream Apply topically 2 (two) times daily.  30 g  5  . calcium carbonate (OS-CAL) 600 MG TABS Take 600 mg by mouth 2 (two) times daily with a meal.      . cholecalciferol (VITAMIN D) 1000 UNITS tablet Take 1,000 Units by mouth 2 (two) times daily.      . cyclobenzaprine (FLEXERIL) 5 MG tablet Take 5 mg by mouth 3 (three) times daily as needed. For muscle spasms      . fluconazole (DIFLUCAN) 150 MG tablet Take 1 tablet (150 mg total) by mouth once.  7 tablet  0  . ibuprofen (ADVIL,MOTRIN) 800 MG tablet Take 1 tablet (800 mg total) by mouth 3 (three) times daily.  21 tablet  0  . prenatal vitamin w/FE, FA (PRENATAL 1 + 1) 27-1 MG TABS Take 1 tablet by mouth daily.      . Probiotic Product (ALIGN) 4 MG CAPS Take 4 mg by mouth every morning.  30 capsule  1  . traMADol (ULTRAM) 50 MG tablet Take 1 tablet (50 mg total) by mouth every 6 (six) hours as needed for pain.  15 tablet  0   No current facility-administered medications on  file prior to visit.   No Known Allergies History   Social History  . Marital Status: Single    Spouse Name: N/A    Number of Children: 3  . Years of Education: N/A   Occupational History  .     Social History Main Topics  . Smoking status: Never Smoker   . Smokeless tobacco: Never Used  . Alcohol Use: No  . Drug Use: No  . Sexually Active: Yes    Birth Control/ Protection: None   Other Topics Concern  . Not on file   Social History Narrative  . No narrative on file       Review of Systems     Objective:   Physical Exam BP 95/65  Pulse 75  Temp(Src) 97.5 F (36.4 C) (Oral)  Ht 5' 6.5" (1.689 m)  Wt 189 lb 4.8 oz (85.866 kg)  BMI 30.1 kg/m2  LMP 03/13/2012 Pt in NAD Lungs: CTA CV: RRR Abd: soft, sl tender diffusely, ND, No rebound or guarding    11/2011 Findings:  Uterus: 7.8 x 4.3 x 5.9 cm. Retroverted. A  tiny fibroid is seen  in the left posterior corpus measuring 1.1 cm. No other fibroids  or uterine mass identified.  Endometrium: Double layer thickness measures 19 mm transvaginally.  No focal lesion visualized.  Right ovary: 2.9 x 1.4 x 2.0 cm. Normal appearance.  Left ovary: 3.0 x 2.1 x 2.5 cm. A small corpus luteum is seen  measuring 2 cm in diameter. No ovarian or adnexal mass identified.  Other Findings: Tiny amount of free fluid noted in cul-de-sac and  both adnexal regions, which isnonspecific but likely physiologic.  IMPRESSION:  1. Retroverted uterus with 1.1 cm fibroid in the left posterior  corpus.  2. Normal ovaries. No adnexal mass identified.       Assessment:     Pelvic pain  Oligomenorrhea- desires a pregnancy    Plan:     Pelvic sono If sono normal f/u in 2 months Motrin 800mg  q 8 hours

## 2012-06-21 ENCOUNTER — Ambulatory Visit (HOSPITAL_COMMUNITY)
Admission: RE | Admit: 2012-06-21 | Discharge: 2012-06-21 | Disposition: A | Discharge status: Home / Self Care | Payer: Medicaid Other | Source: Ambulatory Visit | Attending: Obstetrics & Gynecology | Admitting: Obstetrics & Gynecology

## 2012-06-21 DIAGNOSIS — N854 Malposition of uterus: Secondary | ICD-10-CM | POA: Insufficient documentation

## 2012-06-21 DIAGNOSIS — R102 Pelvic and perineal pain: Secondary | ICD-10-CM

## 2012-06-21 DIAGNOSIS — N949 Unspecified condition associated with female genital organs and menstrual cycle: Secondary | ICD-10-CM | POA: Insufficient documentation

## 2012-06-21 DIAGNOSIS — R1032 Left lower quadrant pain: Secondary | ICD-10-CM | POA: Insufficient documentation

## 2012-06-28 ENCOUNTER — Encounter: Payer: Self-pay | Admitting: Gastroenterology

## 2012-06-28 ENCOUNTER — Ambulatory Visit (AMBULATORY_SURGERY_CENTER): Payer: Medicaid Other | Admitting: Gastroenterology

## 2012-06-28 VITALS — BP 105/68 | HR 77 | Temp 97.1°F | Resp 22 | Ht 66.0 in | Wt 188.0 lb

## 2012-06-28 DIAGNOSIS — K2970 Gastritis, unspecified, without bleeding: Secondary | ICD-10-CM

## 2012-06-28 DIAGNOSIS — K2990 Gastroduodenitis, unspecified, without bleeding: Secondary | ICD-10-CM

## 2012-06-28 DIAGNOSIS — R1032 Left lower quadrant pain: Secondary | ICD-10-CM

## 2012-06-28 HISTORY — DX: Gastritis, unspecified, without bleeding: K29.70

## 2012-06-28 MED ORDER — SODIUM CHLORIDE 0.9 % IV SOLN: 500.0000 mL | INTRAVENOUS | Status: DC

## 2012-06-28 NOTE — Progress Notes (Addendum)
Patient did not have preoperative order for IV antibiotic SSI prophylaxis. [phone])   Patient did not experience any of the following events: a burn prior to discharge; a fall within the facility; wrong site/side/patient/procedure/implant event; or a hospital transfer or hospital admission upon discharge from the facility. [phone])  Patient stated that she really wanted to stay here and sleep.  I explained that she could sleep at home.  She is now awaiting her ride home.  Her mother is with her. VSS. Patient able to move around normally.  To holding room in recliner til discharge.

## 2012-06-28 NOTE — Patient Instructions (Addendum)
YOU HAD AN ENDOSCOPIC PROCEDURE TODAY AT THE [facility]: Refer to the procedure report that was given to you for any specific questions about what was found during the examination.  If the procedure report does not answer your questions, please call your gastroenterologist to clarify.  If you requested that your care partner not be given the details of your procedure findings, then the procedure report has been included in a sealed envelope for you to review at your convenience later.  YOU SHOULD EXPECT: Some feelings of bloating in the abdomen. Passage of more gas than usual.  Walking can help get rid of the air that was put into your GI tract during the procedure and reduce the bloating. If you had a lower endoscopy (such as a colonoscopy or flexible sigmoidoscopy) you may notice spotting of blood in your stool or on the toilet paper. If you underwent a bowel prep for your procedure, then you may not have a normal bowel movement for a few days.  DIET: Your first meal following the procedure should be a light meal and then it is ok to progress to your normal diet.  A half-sandwich or bowl of soup is an example of a good first meal.  Heavy or fried foods are harder to digest and may make you feel nauseous or bloated.  Likewise meals heavy in dairy and vegetables can cause extra gas to form and this can also increase the bloating.  Drink plenty of fluids but you should avoid alcoholic beverages for 24 hours.  ACTIVITY: Your care partner should take you home directly after the procedure.  You should plan to take it easy, moving slowly for the rest of the day.  You can resume normal activity the day after the procedure however you should NOT DRIVE or use heavy machinery for 24 hours (because of the sedation medicines used during the test).    SYMPTOMS TO REPORT IMMEDIATELY: A gastroenterologist can be reached at any hour.  During normal business hours, 8:30 AM to 5:00 PM Monday through Friday,  call [phone].  After hours and on weekends, please call the GI answering service at [phone] who will take a message and have the physician on call contact you.  Following upper endoscopy (EGD)  Vomiting of blood or coffee ground material  New chest pain or pain under the shoulder blades  Painful or persistently difficult swallowing  New shortness of breath  Fever of 100F or higher  Black, tarry-looking stools  FOLLOW UP: If any biopsies were taken you will be contacted by phone or by letter within the next 1-3 weeks.  Call your gastroenterologist if you have not heard about the biopsies in 3 weeks.  Our staff will call the home number listed on your records the next business day following your procedure to check on you and address any questions or concerns that you may have at that time regarding the information given to you following your procedure. This is a courtesy call and so if there is no answer at the home number and we have not heard from you through the emergency physician on call, we will assume that you have returned to your regular daily activities without incident.  SIGNATURES/CONFIDENTIALITY: You and/or your care partner have signed paperwork which will be entered into your electronic medical record.  These signatures attest to the fact that that the information above on your After Visit Summary has been reviewed and is understood.  Full responsibility of   the confidentiality of this discharge information lies with you and/or your care-partner. 

## 2012-06-28 NOTE — Op Note (Signed)
[facility] [address] [place], [address]   ENDOSCOPY PROCEDURE REPORT  PATIENT: Kristin Joseph, Kristin Joseph  MR#: [id number] BIRTHDATE: 18-Jun-1970 , 41  yrs. old GENDER: Female ENDOSCOPIST: Rachael Fee, MD PROCEDURE DATE:  06/28/2012 PROCEDURE:  EGD w/ biopsy ASA CLASS:     Class II INDICATIONS:  left sided abd pain, worse with eating or urinating; cbc, cmet normal; CT scan no clear etiology; + stones in GB but pain not likely related. MEDICATIONS: Fentanyl 50 mcg IV, Versed 6 mg IV, and These medications were titrated to patient response per physician's verbal order TOPICAL ANESTHETIC: none  DESCRIPTION OF PROCEDURE: After the risks benefits and alternatives of the procedure were thoroughly explained, informed consent was obtained.  The LB [id number] endoscope was introduced through the mouth and advanced to the second portion of the duodenum. Without limitations.  The instrument was slowly withdrawn as the mucosa was fully examined.     There was mild, non-specific gastritis distally.  Biopsies taken and sent to pathology.  The examination was otherwise normal. Retroflexed views revealed no abnormalities.     The scope was then withdrawn from the patient and the procedure completed.  COMPLICATIONS: There were no complications. ENDOSCOPIC IMPRESSION: There was mild, non-specific gastritis distally.  Biopsies taken and sent to pathology.  The examination was otherwise normal.  RECOMMENDATIONS: If biopsies show H.  pylori, you will be started on appropriate antibiotics.    eSigned:  Rachael Fee, MD 06/28/2012 10:17 AM

## 2012-06-29 ENCOUNTER — Telehealth: Payer: Self-pay

## 2012-06-29 NOTE — Telephone Encounter (Signed)
Called twice after pick up no one spoke. Left message even tho no machine picked up. Phone was picked up and no one spoke.

## 2012-07-01 ENCOUNTER — Encounter: Payer: Self-pay | Admitting: Internal Medicine

## 2012-07-01 NOTE — Progress Notes (Signed)
Quick Note:  Please hava patient come back for Mammogram findings follow up ASAP ______

## 2012-07-05 ENCOUNTER — Encounter: Payer: Self-pay | Admitting: Gastroenterology

## 2012-07-07 ENCOUNTER — Telehealth: Payer: Self-pay | Admitting: *Deleted

## 2012-07-07 NOTE — Telephone Encounter (Signed)
07/07/12 Patient unavailable message left for patient to call clinic and schedule An appointment for follow up Mammogram results. P.Tremeka Helbling,RN BSN [facility]

## 2012-07-07 NOTE — Telephone Encounter (Signed)
Unable to reach patient message left via telephone for  Patient to call clinic and schedule follow up appointment. P.Vennie Waymire, RN BSN [facility]

## 2012-07-27 ENCOUNTER — Ambulatory Visit: Payer: Medicaid Other | Admitting: Physical Therapy

## 2012-07-27 ENCOUNTER — Ambulatory Visit: Payer: Medicaid Other

## 2012-07-29 ENCOUNTER — Ambulatory Visit: Payer: Medicaid Other

## 2012-08-03 ENCOUNTER — Ambulatory Visit: Payer: Medicaid Other | Admitting: Physical Therapy

## 2012-08-26 ENCOUNTER — Emergency Department (HOSPITAL_COMMUNITY)
Admission: EM | Admit: 2012-08-26 | Discharge: 2012-08-26 | Disposition: A | Discharge status: Home / Self Care | Payer: Medicaid Other | Source: Home / Self Care | Attending: Family Medicine | Admitting: Family Medicine

## 2012-08-26 ENCOUNTER — Encounter (HOSPITAL_COMMUNITY): Payer: Self-pay

## 2012-08-26 DIAGNOSIS — R109 Unspecified abdominal pain: Secondary | ICD-10-CM

## 2012-08-26 MED ORDER — GI COCKTAIL ~~LOC~~
ORAL | Status: AC
  Filled 2012-08-26: qty 30

## 2012-08-26 MED ORDER — HYOSCYAMINE SULFATE 0.125 MG PO TABS: 0.1250 mg | ORAL_TABLET | Freq: Four times a day (QID) | ORAL | Status: DC | PRN

## 2012-08-26 MED ORDER — HYOSCYAMINE SULFATE 0.125 MG PO TABS
0.1250 mg | ORAL_TABLET | Freq: Four times a day (QID) | ORAL | Status: DC | PRN
Start: 2012-08-26 — End: 2012-09-08

## 2012-08-26 MED ORDER — GI COCKTAIL ~~LOC~~
30.0000 mL | Freq: Once | ORAL | Status: AC
  Administered 2012-08-26: 30 mL via ORAL

## 2012-08-26 NOTE — ED Notes (Signed)
C/o 4 year duration of pain in her LLQ, left flank, lower abdominal area; c/o pain worse w ambulation, sitting, eating. States her pain has gotten worse past 3-4 months; has been afraid to come to be seen

## 2012-08-26 NOTE — ED Provider Notes (Addendum)
CSN: [id number]     Arrival date & time 08/26/12  1531 History     First MD Initiated Contact with Patient 08/26/12 1600     Chief Complaint  Patient presents with  . Abdominal Pain   (Consider location/radiation/quality/duration/timing/severity/associated sxs/prior Treatment) Patient is a 42 y.o. female presenting with abdominal pain. The history is provided by the patient.  Abdominal Pain This is a chronic problem. The current episode started more than 1 week ago (off and on for 2 yrs , constant for 51mo.). The problem occurs constantly. Associated symptoms include abdominal pain. The symptoms are aggravated by eating and drinking (even water causes abd pain.).    Past Medical History  Diagnosis Date  . Depression   . Stress headaches   . Pelvic pain   . Back pain   . Vitamin D deficiency    Past Surgical History  Procedure Laterality Date  . Foot surgery Bilateral    Family History  Problem Relation Age of Onset  . Depression Mother   . Colon cancer Neg Hx    History  Substance Use Topics  . Smoking status: Never Smoker   . Smokeless tobacco: Never Used  . Alcohol Use: No   OB History   Grav Para Term Preterm Abortions TAB SAB Ect Mult Living   4 3 3  1  1   3      Review of Systems  Constitutional: Negative for fever, appetite change and unexpected weight change.  Gastrointestinal: Positive for abdominal pain. Negative for nausea, vomiting, diarrhea, constipation, blood in stool and abdominal distention.  Genitourinary: Negative.     Allergies  Review of patient's allergies indicates no known allergies.  Home Medications   Current Outpatient Rx  Name  Route  Sig  Dispense  Refill  . betamethasone dipropionate (DIPROLENE) 0.05 % ointment   Topical   Apply topically 2 (two) times daily.   30 g   5   . betamethasone valerate (VALISONE) 0.1 % cream   Topical   Apply topically 2 (two) times daily.   30 g   5   . calcium carbonate (OS-CAL) 600 MG TABS    Oral   Take 600 mg by mouth 2 (two) times daily with a meal.         . cholecalciferol (VITAMIN D) 1000 UNITS tablet   Oral   Take 1,000 Units by mouth 2 (two) times daily.         . cyclobenzaprine (FLEXERIL) 5 MG tablet   Oral   Take 5 mg by mouth 3 (three) times daily as needed. For muscle spasms         . fluconazole (DIFLUCAN) 150 MG tablet   Oral   Take 1 tablet (150 mg total) by mouth once.   7 tablet   0   . hyoscyamine (LEVSIN, ANASPAZ) 0.125 MG tablet   Oral   Take 1 tablet (0.125 mg total) by mouth every 6 (six) hours as needed for cramping.   30 tablet   1   . ibuprofen (ADVIL,MOTRIN) 800 MG tablet   Oral   Take 1 tablet (800 mg total) by mouth 3 (three) times daily.   30 tablet   1   . ibuprofen (ADVIL,MOTRIN) 800 MG tablet   Oral   Take 1 tablet (800 mg total) by mouth every 8 (eight) hours as needed for pain.   60 tablet   1   . prenatal vitamin w/FE, FA (PRENATAL 1 + 1)  27-1 MG TABS   Oral   Take 1 tablet by mouth daily.         . Probiotic Product (ALIGN) 4 MG CAPS   Oral   Take 4 mg by mouth every morning.   30 capsule   1   . traMADol (ULTRAM) 50 MG tablet   Oral   Take 1 tablet (50 mg total) by mouth every 6 (six) hours as needed for pain.   15 tablet   0    BP 104/69  Pulse 78  Temp(Src) 98.7 F (37.1 C) (Oral)  Resp 16  SpO2 100% Physical Exam  Nursing note and vitals reviewed. Constitutional: She is oriented to person, place, and time. She appears well-developed and well-nourished.  Abdominal: Soft. Bowel sounds are normal. She exhibits no distension and no mass. There is tenderness in the left upper quadrant and left lower quadrant. There is no rigidity, no rebound, no guarding and no CVA tenderness. Hernia confirmed negative in the left inguinal area.  Lymphadenopathy:    She has no cervical adenopathy.  Neurological: She is alert and oriented to person, place, and time.  Skin: Skin is warm and dry.    ED Course    Procedures (including critical care time)  Labs Reviewed - No data to display No results found. 1. Abdominal pain in female patient     MDM    Linna Hoff, MD 08/26/12 1621  Linna Hoff, MD 08/26/12 [phone]

## 2012-09-07 ENCOUNTER — Ambulatory Visit: Payer: Medicaid Other | Admitting: Gastroenterology

## 2012-09-07 ENCOUNTER — Telehealth: Payer: Self-pay | Admitting: Gastroenterology

## 2012-09-07 NOTE — Telephone Encounter (Signed)
Pt walked in for appt 45 minutes late and asked to be seen sooner than Dr Christella Hartigan next available on 10/12/12.  Pt has been added to Jessica's schedule for 09/08/12.

## 2012-09-08 ENCOUNTER — Encounter: Payer: Self-pay | Admitting: *Deleted

## 2012-09-08 ENCOUNTER — Other Ambulatory Visit: Payer: Self-pay | Admitting: *Deleted

## 2012-09-08 ENCOUNTER — Ambulatory Visit (INDEPENDENT_AMBULATORY_CARE_PROVIDER_SITE_OTHER): Payer: Medicaid Other | Admitting: Gastroenterology

## 2012-09-08 VITALS — BP 94/64 | HR 88 | Ht 65.0 in | Wt 195.1 lb

## 2012-09-08 DIAGNOSIS — R1032 Left lower quadrant pain: Secondary | ICD-10-CM

## 2012-09-08 MED ORDER — DICYCLOMINE HCL 20 MG PO TABS: 20.0000 mg | ORAL_TABLET | Freq: Two times a day (BID) | ORAL | Status: DC

## 2012-09-08 MED ORDER — MOVIPREP 100 G PO SOLR: 1.0000 | Freq: Once | ORAL | Status: DC

## 2012-09-08 NOTE — Patient Instructions (Addendum)
You have been scheduled for a colonoscopy with propofol. Please follow written instructions given to you at your visit today.  Please pick up your prep kit at the pharmacy within the next 1-3 days. If you use inhalers (even only as needed), please bring them with you on the day of your procedure. Your physician has requested that you go to www.startemmi.com and enter the access code given to you at your visit today. This web site gives a general overview about your procedure. However, you should still follow specific instructions given to you by our office regarding your preparation for the procedure.  We have sent the following medications to your pharmacy for you to pick up at your convenience: Bentyl  Keep your appointment with Dr Gerilyn Pilgrim on 10/12/2012 @ 3:00 pm.

## 2012-09-08 NOTE — Progress Notes (Signed)
09/08/2012 Kristin Joseph [id number] Apr 22, 1970   History of Present Illness:  This is a pleasant 42 year old female who is a patient of Dr. Christella Hartigan.  She was seen by him earlier this year for complaints of left sided abdominal pain.  She's been evaluated with CT scan of the abdomen and pelvis with contrast, MRI, pelvic ultrasounds, and EGD, which have all been unremarkable and unrevealing for source of pain.  She says that the pain has been present for two years, but she ignored it for a long time.  She decided to seek evaluation when then pain began to worsen and interfere with her daily activities.  She says that pain is constant, day and night, but worsens with movement such as walking and exercising, however, also worsens with BM's and when she eats.  It is interfering with her ability to do housework, etc as well.  She denies any issues with constipation, diarrhea, bloody stool, fevers/chills, and weight loss.  She does eat less due to the pain, however.   Current Medications, Allergies, Past Medical History, Past Surgical History, Family History and Social History were reviewed in [employer] record.   Physical Exam: BP 94/64  Pulse 88  Ht 5\' 5"  (1.651 m)  Wt 195 lb 2 oz (88.508 kg)  BMI 32.47 kg/m2  LMP 09/04/2012 General: Well developed female in no acute distress Head: Normocephalic and atraumatic Eyes:  sclerae anicteric, conjunctiva pink  Ears: Normal auditory acuity Lungs: Clear throughout to auscultation Heart: Regular rate and rhythm Abdomen: Soft, non-distended. No masses, no hepatomegaly. Normal bowel sounds.  Moderate LLQ TTP without R/R/G.  No definite hernia noted. Musculoskeletal: Symmetrical with no gross deformities  Extremities: No edema  Neurological: Alert oriented x 4, grossly nonfocal Psychological:  Alert and cooperative. Normal mood and affect  Assessment and Recommendations: -LLQ abdominal pain:  Unsure of the cause of her pain.  It  sounds like musculoskeletal or hernia since it worsens with exercise and movement, but it also worsens with eating.  CT scan, MRI, pelvic ultrasounds, and EGD have all been unremarkable.  Will schedule for colonoscopy to complete GI evaluation, however, if negative then will need to be deferred back to her PCP for non-GI related treatment.  Will give her Bentyl 20 mg BID to try in the interim.

## 2012-09-08 NOTE — Progress Notes (Signed)
i agree with this plan 

## 2012-09-13 ENCOUNTER — Encounter: Payer: Self-pay | Admitting: Gastroenterology

## 2012-09-13 ENCOUNTER — Ambulatory Visit (AMBULATORY_SURGERY_CENTER): Payer: Medicaid Other | Admitting: Gastroenterology

## 2012-09-13 VITALS — BP 111/68 | HR 79 | Resp 15 | Ht 65.0 in | Wt 195.0 lb

## 2012-09-13 DIAGNOSIS — R1032 Left lower quadrant pain: Secondary | ICD-10-CM

## 2012-09-13 MED ORDER — SODIUM CHLORIDE 0.9 % IV SOLN: 500.0000 mL | INTRAVENOUS | Status: DC

## 2012-09-13 NOTE — Progress Notes (Addendum)
1551. Pt discharged off monitor to go home and asked to ambulate to bathroom.  Pt assisted to bath room by RN and  Mother. Pt seated to toilet and instructed to not lock door and to pull record cord if pro   1600. To bath room to check on pt.  Still seated on toilet with no distress. Mother at her side. ewm  1615. Again to bathroom to check on pt. Pt standing at sink vomiting large amount of clear emesis with mother at her side. Pt did drink 2 small cups of water prior to bath room . Pt states she thinks she is vomiting because she drank too fats and she is hungry. Pt denies pain and states she wants to go to her bay. Pt assisted to her bay by this RN and mother and pt sat, then laid on bed asking if she can sleep for 10 minutes. Instructed pt that she needs to dress, and she can be discharged home, that she will be more comfortable at home. Pt verbalized understanding of this, sat to side of bed. Pt dressed with assistance of mother and RN, transferred to wheel chair, then asked to go to bath room a second time to apply some ointment to her rectal area. ewm  1630. Pt again in bathroom with assistance of mother and RN, assisted with application of the ointment she wanted to her rectum, redressed and transferred to wheel chair for discharge. Pt denies nausea, pain, abd cramping at this time.  Back to bay and asked who was driving and pt's son states the patient. The patient then states her mother is going to help her drive home, then her sister is going to come to her house to help her. Instructed pt and family that the patient cannot drive due to her sedation. ewm  1645 pt states she has called her sister to come get her, pt to conference room with son and mother awaiting arrival of sister to transport pt home. Pt sipping on some tea and eating some plain cake as pt states she feels like she has to eat/drink to feel better. Pt denies pain, discomfort, nausea at this time. ewm  1700. Pt ,son, and mother  continues to wait in conference room, pt denies pain, discomfort, nausea at this time. Pt states her sister is on the way, maybe on [address] at this time. ewm  1715. Pt continues to wait on ride in conference room with no pain, no nausea, no distress with mother and son. Pt has been  in recovery now 2 hours and 15 minutes. ewm  1730. Son and mother states that sister is 5-7 minutes away, pt has doozed off in the chair, no ride at this point. ewm  1740. Elmon Kirschner RN asked family if their ride was close, they state she in in traffic on [address]. Family instructed that if a ride doesn't show soon, they will have to call a taxi and family verbalized understanding of such. ewm

## 2012-09-13 NOTE — Patient Instructions (Addendum)

## 2012-09-13 NOTE — Op Note (Signed)
[facility] [address] [place], [address]   COLONOSCOPY PROCEDURE REPORT  PATIENT: Kristin Joseph, Kristin Joseph  MR#: [id number] BIRTHDATE: October 16, 1970 , 41  yrs. old GENDER: Female ENDOSCOPIST: Rachael Fee, MD PROCEDURE DATE:  09/13/2012 PROCEDURE:   Colonoscopy, diagnostic First Screening Colonoscopy - Avg.  risk and is 50 yrs.  old or older - No.  Prior Negative Screening - Now for repeat screening. N/A  History of Adenoma - Now for follow-up colonoscopy & has been > or = to 3 yrs.  N/A  Polyps Removed Today? No.  Recommend repeat exam, <10 yrs? No. ASA CLASS:   Class II INDICATIONS:left sided abd pain, worse with eating or urinating; cbc, cmet normal; CT scan no clear etiology; MRI abd normal; + stones in GB but pain seems unrelated clinically; EGD 06/2012 mild gastritis, H pylori neg on pathology; 06/2012 pelvic US negative  MEDICATIONS: Fentanyl 100 mcg IV, Versed 10 mg IV, and These medications were titrated to patient response per physician's verbal order  DESCRIPTION OF PROCEDURE:   After the risks benefits and alternatives of the procedure were thoroughly explained, informed consent was obtained.  A digital rectal exam revealed no abnormalities of the rectum.   The LB [id number]  endoscope was introduced through the anus and advanced to the cecum, which was identified by both the appendix and ileocecal valve. No adverse events experienced.   The quality of the prep was good, using MoviPrep  The instrument was then slowly withdrawn as the colon was fully examined.      COLON FINDINGS: A normal appearing cecum, ileocecal valve, and appendiceal orifice were identified.  The ascending, hepatic flexure, transverse, splenic flexure, descending, sigmoid colon and rectum appeared unremarkable.  No polyps or cancers were seen. Retroflexed views revealed no abnormalities. The time to cecum=9 minutes 19 seconds.  Withdrawal time=5 minutes 42 seconds.   The scope was withdrawn and the procedure completed. COMPLICATIONS: There were no complications.  ENDOSCOPIC IMPRESSION: Normal colon  RECOMMENDATIONS: You need to follow up with your primary care physician to workup non-GI causes of your left sided abdominal pains.  Workup, including CT scan, MRI, [place], CBC, cmet, EGD, colonoscopy has  been unrevealing. Trial of twice daily antispasm med called in, this can help if your pains are related to bowel spasm.   eSigned:  Rachael Fee, MD 09/13/2012 3:01 PM

## 2012-09-14 ENCOUNTER — Telehealth: Payer: Self-pay | Admitting: *Deleted

## 2012-09-14 NOTE — Telephone Encounter (Signed)
No answer, message left for the patient. 

## 2012-09-20 ENCOUNTER — Encounter: Payer: Self-pay | Admitting: *Deleted

## 2012-09-28 ENCOUNTER — Other Ambulatory Visit: Payer: Self-pay | Admitting: Internal Medicine

## 2012-09-28 DIAGNOSIS — N649 Disorder of breast, unspecified: Secondary | ICD-10-CM

## 2012-10-05 ENCOUNTER — Telehealth: Payer: Self-pay | Admitting: Gastroenterology

## 2012-10-05 NOTE — Telephone Encounter (Signed)
Message copied by Arna Snipe on Wed Oct 05, 2012  2:57 PM ------      Message from: Donata Duff      Created: Wed Sep 07, 2012 11:46 AM       DO NOT BILL ------

## 2012-10-10 ENCOUNTER — Ambulatory Visit (HOSPITAL_COMMUNITY)
Admission: RE | Admit: 2012-10-10 | Discharge: 2012-10-10 | Disposition: A | Discharge status: Home / Self Care | Payer: Medicaid Other | Source: Ambulatory Visit | Attending: Nurse Practitioner | Admitting: Nurse Practitioner

## 2012-10-10 ENCOUNTER — Other Ambulatory Visit (HOSPITAL_COMMUNITY): Payer: Self-pay | Admitting: Nurse Practitioner

## 2012-10-10 DIAGNOSIS — R52 Pain, unspecified: Secondary | ICD-10-CM

## 2012-10-10 DIAGNOSIS — M79609 Pain in unspecified limb: Secondary | ICD-10-CM | POA: Insufficient documentation

## 2012-10-10 DIAGNOSIS — M545 Low back pain, unspecified: Secondary | ICD-10-CM | POA: Insufficient documentation

## 2012-10-10 DIAGNOSIS — Z9181 History of falling: Secondary | ICD-10-CM | POA: Insufficient documentation

## 2012-10-12 ENCOUNTER — Ambulatory Visit: Payer: Medicaid Other | Admitting: Gastroenterology

## 2012-10-21 ENCOUNTER — Ambulatory Visit
Admission: RE | Admit: 2012-10-21 | Discharge: 2012-10-21 | Disposition: A | Discharge status: Home / Self Care | Payer: Medicaid Other | Source: Ambulatory Visit | Attending: Internal Medicine | Admitting: Internal Medicine

## 2012-10-21 DIAGNOSIS — N649 Disorder of breast, unspecified: Secondary | ICD-10-CM

## 2012-10-21 NOTE — Progress Notes (Signed)
Quick Note:  Please have patient see OB physician for breast ultrasound findings, likely benign findings but needs close followup with her OB physician on a regular basis. ______

## 2012-10-24 ENCOUNTER — Telehealth: Payer: Self-pay | Admitting: Emergency Medicine

## 2012-10-24 ENCOUNTER — Encounter: Payer: Self-pay | Admitting: Neurology

## 2012-10-24 NOTE — Telephone Encounter (Signed)
Message copied by Darlis Loan on Mon Oct 24, 2012 11:48 AM ------      Message from: [facility], [place]      Created: Fri Oct 21, 2012  2:02 PM       Please have patient see [facility] physician for breast ultrasound findings, likely benign findings but needs close followup with her [facility] physician on a regular basis. ------

## 2012-10-24 NOTE — Telephone Encounter (Signed)
Left message with pt instructions and results

## 2012-10-25 ENCOUNTER — Ambulatory Visit (INDEPENDENT_AMBULATORY_CARE_PROVIDER_SITE_OTHER): Payer: Medicaid Other | Admitting: Neurology

## 2012-10-25 ENCOUNTER — Encounter: Payer: Self-pay | Admitting: Neurology

## 2012-10-25 VITALS — BP 108/69 | HR 83 | Ht 66.7 in | Wt 192.0 lb

## 2012-10-25 DIAGNOSIS — IMO0001 Reserved for inherently not codable concepts without codable children: Secondary | ICD-10-CM

## 2012-10-25 DIAGNOSIS — M797 Fibromyalgia: Secondary | ICD-10-CM

## 2012-10-25 HISTORY — DX: Fibromyalgia: M79.7

## 2012-10-25 MED ORDER — PREGABALIN 50 MG PO CAPS: ORAL_CAPSULE | ORAL | Status: DC

## 2012-10-25 NOTE — Progress Notes (Signed)
Reason for visit: Back pain  Kristin Joseph is a 42 y.o. female  History of present illness:  Kristin Joseph is a 42 year old right-handed black female with a chronic history of total body pain including the shoulders, neck, head, back, legs and feet. The patient was seen last through this office in 2012. At that point, the patient had MRI evaluation of the cervical, thoracic, and lumbosacral spines that were unremarkable. EMG and nerve conduction studies involving the lower extremities were unremarkable. The patient was placed on gabapentin, and she apparently had some benefit with this medication. The patient returns at this point, no longer on gabapentin. The patient continues to have her usual complaints of neck pain, shoulder pain, back pain, leg pain and some occasional headaches coming up from the back of the head. The patient feels as if she has some exercise intolerance, increased pain with walking, and some fatigue of the legs. The patient notes a mild gait instability, and she denies problems controlling the bowels or the bladder. The patient will occasionally have pain with voiding the bladder, however. The patient has a history of plantar fasciitis on the left, difficulty with pain with weightbearing on the left foot. Occasionally, she will have some shock type pain down the legs posteriorly from the buttocks down into the thighs.  Past Medical History  Diagnosis Date  . Depression   . Stress headaches   . Pelvic pain   . Back pain   . Vitamin D deficiency   . Gastritis 06/28/2012    Mild  . Fibromyalgia 10/25/2012  . Obesity     Past Surgical History  Procedure Laterality Date  . Foot surgery Bilateral     Plantar fasciitis  . Appendectomy    . Thyroidectomy, partial      Family History  Problem Relation Age of Onset  . Depression Mother   . Colon cancer Neg Hx     Social history:  reports that she has never smoked. She has never used smokeless tobacco. She reports  that she does not drink alcohol or use illicit drugs.  Medications:  Current Outpatient Prescriptions on File Prior to Visit  Medication Sig Dispense Refill  . betamethasone dipropionate (DIPROLENE) 0.05 % ointment Apply topically 2 (two) times daily.  30 g  5  . betamethasone valerate (VALISONE) 0.1 % cream Apply topically 2 (two) times daily.  30 g  5  . calcium carbonate (OS-CAL) 600 MG TABS Take 600 mg by mouth 2 (two) times daily with a meal.      . cholecalciferol (VITAMIN D) 1000 UNITS tablet Take 1,000 Units by mouth 2 (two) times daily.      Marland Kitchen dicyclomine (BENTYL) 20 MG tablet Take 1 tablet (20 mg total) by mouth 2 (two) times daily.  60 tablet  0  . ibuprofen (ADVIL,MOTRIN) 800 MG tablet Take 1 tablet (800 mg total) by mouth every 8 (eight) hours as needed for pain.  60 tablet  1  . prenatal vitamin w/FE, FA (PRENATAL 1 + 1) 27-1 MG TABS Take 1 tablet by mouth daily.      . traMADol (ULTRAM) 50 MG tablet Take 1 tablet (50 mg total) by mouth every 6 (six) hours as needed for pain.  15 tablet  0   No current facility-administered medications on file prior to visit.     No Known Allergies  ROS:  Out of a complete 14 system review of symptoms, the patient complains only of the following symptoms, and  all other reviewed systems are negative.  Neuromuscular pain, all 4 extremities Low back pain Dysuria  Blood pressure 108/69, pulse 83, height 5' 6.7" (1.694 m), weight 192 lb (87.091 kg).  Physical Exam  General: The patient is alert and cooperative at the time of the examination. The patient is minimally obese.  Head: Pupils are equal, round, and reactive to light. Discs are flat bilaterally.  Neck: The neck is supple, no carotid bruits are noted.  Respiratory: The respiratory examination is clear.  Cardiovascular: The cardiovascular examination reveals a regular rate and rhythm, no obvious murmurs or rubs are noted.  Neuromuscular: The patient has good range of movement  of the cervical and lumbosacral spine.  Skin: Extremities are without significant edema.  Neurologic Exam  Mental status:  Cranial nerves: Facial symmetry is present. There is good sensation of the face to pinprick and soft touch bilaterally. The strength of the facial muscles and the muscles to head turning and shoulder shrug are normal bilaterally. Speech is well enunciated, no aphasia or dysarthria is noted. Extraocular movements are full. Visual fields are full.  Motor: The motor testing reveals 5 over 5 strength of all 4 extremities. Good symmetric motor tone is noted throughout.  Sensory: Sensory testing is intact to pinprick, soft touch, vibration sensation, and position sense on all 4 extremities, with exception that the position sense is slightly decreased in the feet. No evidence of extinction is noted.  Coordination: Cerebellar testing reveals good finger-nose-finger and heel-to-shin bilaterally.  Gait and station: Gait is normal. Tandem gait is normal. Romberg is negative. No drift is seen.  Reflexes: Deep tendon reflexes are symmetric and normal bilaterally. Toes are downgoing bilaterally.   Assessment/Plan:  1. Probable fibromyalgia  The current complaints given by the patient today are very similar to what was occurring 3 or 4 years ago. The patient already undergone a fairly extensive workup. The patient did gain some benefit from gabapentin previously. The patient will be placed on Lyrica at this time. The patient will have some blood work done today. The patient will followup through this office in 4 months. The patient will need a stretching exercise program to help maintain a lower level of overall discomfort.  Marlan Palau MD 10/25/2012 9:26 PM  [facility] [address] [place], [place] [address]  Phone [phone] Fax [phone]

## 2012-10-25 NOTE — Patient Instructions (Signed)
With the Lyrica look out for drowsiness, dizziness, gait instability or difficulty with concentration.

## 2012-10-26 LAB — RHEUMATOID FACTOR: Rhuematoid fact SerPl-aCnc: <7 IU/mL (ref 0.0–13.9)

## 2012-10-26 LAB — ANA W/REFLEX: Anti Nuclear Antibody(ANA): NEGATIVE

## 2012-10-26 LAB — LYME, TOTAL AB TEST/REFLEX: Lyme IgG/IgM Ab: <0.91 {ISR} (ref 0.00–0.90)

## 2012-10-26 LAB — ANGIOTENSIN CONVERTING ENZYME: Angio Convert Enzyme: 43 U/L (ref 14–82)

## 2012-10-27 NOTE — Progress Notes (Signed)
Quick Note:  I called and LMVM for pt that labs normal/unremarkable. He is to call back if questions. ______

## 2012-11-07 ENCOUNTER — Encounter (INDEPENDENT_AMBULATORY_CARE_PROVIDER_SITE_OTHER): Payer: Medicaid Other | Admitting: Surgery

## 2012-11-07 ENCOUNTER — Other Ambulatory Visit: Payer: Self-pay | Admitting: Dermatology

## 2013-01-30 ENCOUNTER — Telehealth: Payer: Self-pay | Admitting: Neurology

## 2013-01-30 ENCOUNTER — Encounter: Payer: Self-pay | Admitting: Nurse Practitioner

## 2013-01-30 NOTE — Telephone Encounter (Signed)
Called PT home # and it has been disconnected.  Called the mobile number and left message stating that provider was unavailable for 2/9 appt and we rescheduled for 2/11 @ 11:30.  Sending Letter

## 2013-01-31 ENCOUNTER — Telehealth: Payer: Self-pay | Admitting: Neurology

## 2013-01-31 NOTE — Telephone Encounter (Signed)
Patient wants to be seen sooner than scheduled 03/01/13 appointment with Jeani Hawking for her back problems. Please call to schedule.

## 2013-02-01 NOTE — Telephone Encounter (Signed)
Called patient to reschedule, preferred number was disconnected and other number states that the person cannot be reached at the moment. Will try again later.

## 2013-02-07 ENCOUNTER — Ambulatory Visit: Payer: Self-pay | Admitting: Nurse Practitioner

## 2013-02-10 ENCOUNTER — Ambulatory Visit: Payer: Medicaid Other | Admitting: Obstetrics & Gynecology

## 2013-02-13 ENCOUNTER — Encounter: Payer: Self-pay | Admitting: Obstetrics and Gynecology

## 2013-02-13 ENCOUNTER — Ambulatory Visit (INDEPENDENT_AMBULATORY_CARE_PROVIDER_SITE_OTHER): Payer: Medicaid Other | Admitting: Obstetrics and Gynecology

## 2013-02-13 VITALS — BP 103/68 | HR 75 | Temp 97.1°F | Ht 65.0 in | Wt 187.5 lb

## 2013-02-13 DIAGNOSIS — N949 Unspecified condition associated with female genital organs and menstrual cycle: Secondary | ICD-10-CM

## 2013-02-13 DIAGNOSIS — R102 Pelvic and perineal pain: Secondary | ICD-10-CM

## 2013-02-13 LAB — WET PREP, GENITAL
CLUE CELLS WET PREP: NONE SEEN
Trich, Wet Prep: NONE SEEN
Yeast Wet Prep HPF POC: NONE SEEN

## 2013-02-13 LAB — POCT PREGNANCY, URINE: PREG TEST UR: NEGATIVE

## 2013-02-13 NOTE — Progress Notes (Signed)
Patient ID: Kristin Joseph, female   DOB: 16-Aug-1970, 43 y.o.   MRN: [id number] 43 yo [id number] with chronic pelvic pain presenting today for further evaluation. Patient reports pain worst with walking and exercise. Pain is not affected by her cycles which are now occuring monthly. Patient is still trying to conceive but her husband is not always in town.  Past Medical History  Diagnosis Date  . Depression   . Stress headaches   . Pelvic pain   . Back pain   . Vitamin D deficiency   . Gastritis 06/28/2012    Mild  . Fibromyalgia 10/25/2012  . Obesity    Past Surgical History  Procedure Laterality Date  . Foot surgery Bilateral     Plantar fasciitis  . Appendectomy    . Thyroidectomy, partial     Family History  Problem Relation Age of Onset  . Depression Mother   . Colon cancer Neg Hx    History  Substance Use Topics  . Smoking status: Never Smoker   . Smokeless tobacco: Never Used  . Alcohol Use: No     GENERAL: Well-developed, well-nourished female in no acute distress.  ABDOMEN: Soft, nontender, nondistended. No organomegaly. PELVIC: Normal external female genitalia. Vagina is pink and rugated.  Normal discharge. Normal appearing cervix. Uterus is normal in size. No adnexal mass or tenderness. EXTREMITIES: No cyanosis, clubbing, or edema, 2+ distal pulses.   06/2012 Ultrasound Uterus: 7.0 x 4.1 x 5.3 cm. Retroverted. Diffusely heterogeneous  uterine myometrium noted, but no distinct fibroids visualized on  today's exam.  Endometrium: Double layer thickness measures 3 mm transvaginally.  Right ovary: 1.4 x 1.1 x 1.3 cm. Normal appearance. No adnexal  mass identified.  Left ovary: 2.7 x 1.1 x 1.9 cm. Normal appearance. No adnexal  mass identified.  Other Findings: No free fluid  IMPRESSION:  No evidence of pelvic mass or other significant abnormality.  Original Report Authenticated By: Earle Gell, M.D.  UPT- negative  A/P 43 yo with chronic pelvic pain and desire  for fertility - Ultrasound results reviewed with patient and reassurance provided - wet prep collected - Since onset of pain is associated with speed walking and exercise, I suspect a musculoskeletal origin - Advised the use of ovulation kit predictor and to seek medical assistance via fertility specialist to achieve pregnancy. I reminded the patient of decreased fertility with advancing age.

## 2013-02-14 ENCOUNTER — Ambulatory Visit (INDEPENDENT_AMBULATORY_CARE_PROVIDER_SITE_OTHER): Payer: Medicaid Other | Admitting: Nurse Practitioner

## 2013-02-14 ENCOUNTER — Encounter: Payer: Self-pay | Admitting: Nurse Practitioner

## 2013-02-14 VITALS — BP 100/60 | HR 86 | Ht 66.7 in | Wt 191.0 lb

## 2013-02-14 DIAGNOSIS — M549 Dorsalgia, unspecified: Secondary | ICD-10-CM

## 2013-02-14 DIAGNOSIS — M545 Low back pain, unspecified: Secondary | ICD-10-CM

## 2013-02-14 DIAGNOSIS — M542 Cervicalgia: Secondary | ICD-10-CM

## 2013-02-14 DIAGNOSIS — M533 Sacrococcygeal disorders, not elsewhere classified: Secondary | ICD-10-CM

## 2013-02-14 MED ORDER — PREGABALIN 150 MG PO CAPS: 150.0000 mg | ORAL_CAPSULE | Freq: Two times a day (BID) | ORAL | Status: DC

## 2013-02-14 NOTE — Patient Instructions (Addendum)
Referral to To [facility], Dr. Dian Situ. Someone will call to schedule appointment.  I have increased Lyrica to 150 mg take 1 capsule twice daily.  Side effects may be drowsiness or dizziness.    Follow up with Dr. Jannifer Franklin as needed.

## 2013-02-14 NOTE — Progress Notes (Addendum)
PATIENT: Kristin Joseph DOB: [birth year]   REASON FOR VISIT: follow up for back pain. HISTORY FROM: patient  HISTORY OF PRESENT ILLNESS: 10/25/12 Kristin Joseph):  Kristin Joseph is a 43 year old right-handed [place] female with a chronic history of total body pain including the shoulders, neck, head, back, legs and feet. The patient was seen last through this office in 2012. At that point, the patient had MRI evaluation of the cervical, thoracic, and lumbosacral spines that were unremarkable. EMG and nerve conduction studies involving the lower extremities were unremarkable.  The patient was placed on gabapentin, and she apparently had some benefit with this medication. The patient returns at this point, no longer on gabapentin. The patient continues to have her usual complaints of neck pain, shoulder pain, back pain, leg pain and some occasional headaches coming up from the back of the head. The patient feels as if she has some exercise intolerance, increased pain with walking, and some fatigue of the legs. The patient notes a mild gait instability, and she denies problems controlling the bowels or the bladder. The patient will occasionally have pain with voiding the bladder, however. The patient has a history of plantar fasciitis on the left, difficulty with pain with weightbearing on the left foot. Occasionally, she will have some shock type pain down the legs posteriorly from the buttocks down into the thighs.  UPDATE 02/14/13 (LL):  Kristin Joseph returns for follow up with a constellation of complaints. All lab work from last visit was negative.  She continues to have diffuse back pain which ranges from her neck to her lower back.  She has gotten only mild relief from the Lyrica, asks for something stronger.  The pain is constant and is interfering with daily life.  She unfortunately has a disabled teenage child who requires constant care, I suspect some of this back pain may for due to chronic  musculoskeletal strain from lifting her child.  ROS:  Out of a complete 14 system review of symptoms, the patient complains only of the following symptoms, and all other reviewed systems are negative.  Neuromuscular pain, all 4 extremities  back pain; low middle and neck  ALLERGIES: No Known Allergies  HOME MEDICATIONS: Outpatient Prescriptions Prior to Visit  Medication Sig Dispense Refill  . betamethasone dipropionate (DIPROLENE) 0.05 % ointment Apply topically 2 (two) times daily.  30 g  5  . betamethasone valerate (VALISONE) 0.1 % cream Apply topically 2 (two) times daily.  30 g  5  . calcium carbonate (OS-CAL) 600 MG TABS Take 600 mg by mouth 2 (two) times daily with a meal.      . cholecalciferol (VITAMIN D) 1000 UNITS tablet Take 1,000 Units by mouth 2 (two) times daily.      Marland Kitchen dicyclomine (BENTYL) 20 MG tablet Take 1 tablet (20 mg total) by mouth 2 (two) times daily.  60 tablet  0  . ibuprofen (ADVIL,MOTRIN) 800 MG tablet Take 1 tablet (800 mg total) by mouth every 8 (eight) hours as needed for pain.  60 tablet  1  . Multiple Vitamin (MULTIVITAMIN) capsule Take 1 capsule by mouth daily.      . traMADol (ULTRAM) 50 MG tablet Take 1 tablet (50 mg total) by mouth every 6 (six) hours as needed for pain.  15 tablet  0  . pregabalin (LYRICA) 50 MG capsule One capsule twice a day for 2 weeks, then take one capsule in the morning and 2 capsules in the evening  90 capsule  2   No facility-administered medications prior to visit.    PAST MEDICAL HISTORY: Past Medical History  Diagnosis Date  . Depression   . Stress headaches   . Pelvic pain   . Back pain   . Vitamin D deficiency   . Gastritis 06/28/2012    Mild  . Fibromyalgia 10/25/2012  . Obesity     PAST SURGICAL HISTORY: Past Surgical History  Procedure Laterality Date  . Foot surgery Bilateral     Plantar fasciitis  . Appendectomy    . Thyroidectomy, partial      FAMILY HISTORY: Family History  Problem Relation  Age of Onset  . Depression Mother   . Colon cancer Neg Hx     SOCIAL HISTORY: History   Social History  . Marital Status: Single    Spouse Name: N/A    Number of Children: 3  . Years of Education: 7th grade   Occupational History  .     Social History Main Topics  . Smoking status: Never Smoker   . Smokeless tobacco: Never Used  . Alcohol Use: No  . Drug Use: No  . Sexual Activity: Yes    Birth Control/ Protection: None   Other Topics Concern  . Not on file   Social History Narrative  . No narrative on file     PHYSICAL EXAM  Filed Vitals:   02/14/13 1111  BP: 100/60  Pulse: 86  Height: 5' 6.7" (1.694 m)  Weight: 191 lb (86.637 kg)   Body mass index is 30.19 kg/(m^2).  General: The patient is alert and cooperative at the time of the examination. The patient is minimally obese.  Head: Pupils are equal, round, and reactive to light.   Neck: The neck is supple, no carotid bruits are noted.  Respiratory: The respiratory examination is clear.  Cardiovascular: The cardiovascular examination reveals a regular rate and rhythm, no obvious murmurs or rubs are noted.  Neuromuscular: The patient has good range of movement of the cervical and lumbosacral spine, but movement elicits a painful response. Skin: Extremities are without significant edema.   Neurologic Exam  Mental status: The patient is alert and oriented x 3. Cranial nerves: Facial symmetry is present. There is good sensation of the face to pinprick and soft touch bilaterally. The strength of the facial muscles and the muscles to head turning and shoulder shrug are normal bilaterally. Speech is well enunciated, no aphasia or dysarthria is noted. Extraocular movements are full. Visual fields are full.  Motor: The motor testing reveals 4 over 5 strength of all 4 extremities, with giveaway effort. Good symmetric motor tone is noted throughout.  Sensory: Sensory testing is intact to pinprick, soft touch on all 4  extremities, No evidence of extinction is noted.  Coordination: Cerebellar testing reveals good finger-nose-finger and heel-to-shin bilaterally.  Gait and station: Gait is normal. Tandem gait is normal. Romberg is negative. No drift is seen.  Reflexes: Deep tendon reflexes are symmetric and normal bilaterally. Toes are downgoing bilaterally.   DIAGNOSTIC DATA (LABS, IMAGING, TESTING) - I reviewed patient records, labs, notes, testing and imaging myself where available.  10/25/12 ANA and RF negative.  ACE normal. Lyme negative.   ASSESSMENT AND PLAN 1. Probable fibromyalgia  2. Chronic back pain The current complaints given by the patient today are very similar to what was occurring 3 or 4 years ago. The patient has already undergone a fairly extensive workup. The patient did gain some benefit from gabapentin previously. She  was started on Lyrica at last visit with mild benefit.  PLAN: Referral to To [facility], Dr. Dian Situ for evaluation and treatment. ADDENDUM:  Kristin Joseph from [facility] (Dr. Vira Blanco) called and said they declined taking pt. His tests were normal and did not feel they had anything to offer pt.   I have increased Lyrica to 150 mg take 1 capsule twice daily.   The patient will need a stretching exercise program to help maintain a lower level of overall discomfort as previously recommended. Follow up with Dr. Jannifer Franklin as needed.  Orders Placed This Encounter  Procedures  . Ambulatory referral to Pain Clinic   Meds ordered this encounter  Medications  . pregabalin (LYRICA) 150 MG capsule    Sig: Take 1 capsule (150 mg total) by mouth 2 (two) times daily.    Dispense:  60 capsule    Refill:  5    Order Specific Question:  Supervising Provider    Answer:  Kathrynn Ducking [[id number]]   Return if symptoms worsen or fail to improve.  Philmore Pali, MSN, NP-C 02/14/2013, 12:42 PM [facility] [address] [place], [place] [address] [phone]  Note: This document was prepared with digital dictation and possible smart phrase technology. Any transcriptional errors that result from this process are unintentional.

## 2013-02-27 ENCOUNTER — Ambulatory Visit: Payer: Medicaid Other | Admitting: Nurse Practitioner

## 2013-03-01 ENCOUNTER — Ambulatory Visit: Payer: Self-pay | Admitting: Nurse Practitioner

## 2013-03-21 ENCOUNTER — Telehealth: Payer: Self-pay | Admitting: Neurology

## 2013-03-21 ENCOUNTER — Other Ambulatory Visit: Payer: Self-pay

## 2013-03-21 DIAGNOSIS — Z1231 Encounter for screening mammogram for malignant neoplasm of breast: Secondary | ICD-10-CM

## 2013-03-21 NOTE — Telephone Encounter (Signed)
We have already submitted PA info to ins.  I called medicaid.  Spoke to [facility].  She said there is an approval already on file.  PA # [id number].  She indicates there must be an issue on the pharmacy side.  She asked me to call the pharmacy and asked them to try to process the Rx again, and if they get a rejection, they will need to call Medicaid for assistance.  I called the pharmacy.  They processed the Rx and said it does go through without any issues.  I called the patient back.  Explained the situation.  She verbalized understanding and will follow up with the pharmacy.  She will call us back if needed.

## 2013-03-21 NOTE — Telephone Encounter (Signed)
PT called and stated that [facility] has been trying to send prior authorization for the Lyrica to [place] multiple times but they have not received a response. [facility] told her to call us to check the status of the prior authorization.  Please review and contact PT once everything has been taken care of and prescription has been sent so that she can pick it up.  She stated she is in a lot of pain and can barely get around.  Thank you

## 2013-03-24 ENCOUNTER — Emergency Department (HOSPITAL_COMMUNITY)
Admission: EM | Admit: 2013-03-24 | Discharge: 2013-03-24 | Disposition: A | Discharge status: Home / Self Care | Payer: Medicaid Other | Attending: Emergency Medicine | Admitting: Emergency Medicine

## 2013-03-24 ENCOUNTER — Encounter (HOSPITAL_COMMUNITY): Payer: Self-pay | Admitting: Emergency Medicine

## 2013-03-24 DIAGNOSIS — E559 Vitamin D deficiency, unspecified: Secondary | ICD-10-CM | POA: Insufficient documentation

## 2013-03-24 DIAGNOSIS — Z8739 Personal history of other diseases of the musculoskeletal system and connective tissue: Secondary | ICD-10-CM | POA: Insufficient documentation

## 2013-03-24 DIAGNOSIS — T7840XA Allergy, unspecified, initial encounter: Secondary | ICD-10-CM

## 2013-03-24 DIAGNOSIS — Z8659 Personal history of other mental and behavioral disorders: Secondary | ICD-10-CM | POA: Insufficient documentation

## 2013-03-24 DIAGNOSIS — T4995XA Adverse effect of unspecified topical agent, initial encounter: Secondary | ICD-10-CM | POA: Insufficient documentation

## 2013-03-24 DIAGNOSIS — E669 Obesity, unspecified: Secondary | ICD-10-CM | POA: Insufficient documentation

## 2013-03-24 DIAGNOSIS — Z8719 Personal history of other diseases of the digestive system: Secondary | ICD-10-CM | POA: Insufficient documentation

## 2013-03-24 DIAGNOSIS — Z79899 Other long term (current) drug therapy: Secondary | ICD-10-CM | POA: Insufficient documentation

## 2013-03-24 DIAGNOSIS — R21 Rash and other nonspecific skin eruption: Secondary | ICD-10-CM | POA: Insufficient documentation

## 2013-03-24 MED ORDER — FAMOTIDINE 40 MG PO TABS: 40.0000 mg | ORAL_TABLET | Freq: Every day | ORAL | Status: DC

## 2013-03-24 MED ORDER — METHYLPREDNISOLONE SODIUM SUCC 125 MG IJ SOLR
125.0000 mg | Freq: Once | INTRAMUSCULAR | Status: AC
  Administered 2013-03-24: 125 mg via INTRAVENOUS
  Filled 2013-03-24: qty 2

## 2013-03-24 MED ORDER — PREDNISONE 20 MG PO TABS: 60.0000 mg | ORAL_TABLET | Freq: Every day | ORAL | Status: DC

## 2013-03-24 MED ORDER — DIPHENHYDRAMINE HCL 50 MG/ML IJ SOLN
25.0000 mg | Freq: Once | INTRAMUSCULAR | Status: AC
  Administered 2013-03-24: 25 mg via INTRAVENOUS
  Filled 2013-03-24: qty 1

## 2013-03-24 MED ORDER — DIPHENHYDRAMINE-ZINC ACETATE 1-0.1 % EX CREA: TOPICAL_CREAM | Freq: Three times a day (TID) | CUTANEOUS | Status: DC | PRN

## 2013-03-24 MED ORDER — FAMOTIDINE 20 MG PO TABS
20.0000 mg | ORAL_TABLET | Freq: Once | ORAL | Status: AC
  Administered 2013-03-24: 20 mg via ORAL
  Filled 2013-03-24: qty 1

## 2013-03-24 NOTE — ED Provider Notes (Signed)
CSN: [id number]     Arrival date & time 03/24/13  0109 History   First MD Initiated Contact with Patient 03/24/13 0148     Chief Complaint  Patient presents with  . Allergic Reaction     (Consider location/radiation/quality/duration/timing/severity/associated sxs/prior Treatment) HPI History provided by patient. Itchy rash all over started around 6 AM. Rash and itching has persisted throughout the day, involves torso and extremities and face. Patient denies any recent cough, cold or congestion or fevers. She has had some sore throat and cervical lymph nodes. She denies any new medications. No known new food exposures or new soaps or detergents. No history of same. Rash and itching moderate in severity. No difficulty breathing. No tongue, lip or throat swelling. Past Medical History  Diagnosis Date  . Depression   . Stress headaches   . Pelvic pain   . Back pain   . Vitamin D deficiency   . Gastritis 06/28/2012    Mild  . Fibromyalgia 10/25/2012  . Obesity    Past Surgical History  Procedure Laterality Date  . Foot surgery Bilateral     Plantar fasciitis  . Appendectomy    . Thyroidectomy, partial     Family History  Problem Relation Age of Onset  . Depression Mother   . Colon cancer Neg Hx    History  Substance Use Topics  . Smoking status: Never Smoker   . Smokeless tobacco: Never Used  . Alcohol Use: No   OB History   Grav Para Term Preterm Abortions TAB SAB Ect Mult Living   4 3 3  1  1   3      Review of Systems  Constitutional: Negative for fever and chills.  HENT: Negative for trouble swallowing and voice change.   Respiratory: Negative for shortness of breath.   Cardiovascular: Negative for chest pain.  Gastrointestinal: Negative for abdominal pain.  Genitourinary: Negative for hematuria.  Musculoskeletal: Negative for back pain.  Skin: Positive for rash.  Neurological: Negative for headaches.  All other systems reviewed and are  negative.      Allergies  Review of patient's allergies indicates no known allergies.  Home Medications   Current Outpatient Rx  Name  Route  Sig  Dispense  Refill  . calcium carbonate (OS-CAL) 600 MG TABS   Oral   Take 600 mg by mouth 2 (two) times daily with a meal.         . cholecalciferol (VITAMIN D) 1000 UNITS tablet   Oral   Take 1,000 Units by mouth 2 (two) times daily.         . Multiple Vitamin (MULTIVITAMIN) capsule   Oral   Take 1 capsule by mouth daily.         . diphenhydrAMINE-zinc acetate (BENADRYL) cream   Topical   Apply topically 3 (three) times daily as needed for itching.   28.3 g   0   . famotidine (PEPCID) 40 MG tablet   Oral   Take 1 tablet (40 mg total) by mouth daily.   14 tablet   0   . predniSONE (DELTASONE) 20 MG tablet   Oral   Take 3 tablets (60 mg total) by mouth daily.   15 tablet   0   . pregabalin (LYRICA) 150 MG capsule   Oral   Take 1 capsule (150 mg total) by mouth 2 (two) times daily.   60 capsule   5    BP 108/66  Pulse 69  Temp(Src)  97.7 F (36.5 C) (Oral)  Resp 16  SpO2 97%  LMP 03/06/2013 Physical Exam  Constitutional: She is oriented to person, place, and time. She appears well-developed and well-nourished.  HENT:  Head: Normocephalic and atraumatic.  Mouth/Throat: Oropharynx is clear and moist.  Uvula midline. No lingual, oral or lip swelling  Eyes: EOM are normal. Pupils are equal, round, and reactive to light.  Neck: Neck supple.  Cardiovascular: Normal rate, regular rhythm and intact distal pulses.   Pulmonary/Chest: Effort normal and breath sounds normal. No stridor. No respiratory distress. She has no wheezes.  Abdominal: Soft. She exhibits no distension. There is no tenderness.  Musculoskeletal: Normal range of motion. She exhibits no edema.  Neurological: She is alert and oriented to person, place, and time.  Skin: Skin is warm and dry.  Erythematous patches of blanching rash involves  extremities, torso and face without palmar or intraoral involvement    ED Course  Procedures (including critical care time) Labs Review Labs Reviewed - No data to display Imaging Review No results found.  IV Solu-Medrol, IV Benadryl, Pepcid provided  On recheck itching and rash is improving.  Plan discharge home with prescription for prednisone, Benadryl and Pepcid. Patient does have a primary care followup. She was encouraged to keep a food journal.  She agrees to strict return precautions. She is stable and appropriate for discharge at this time.  MDM   Final diagnoses:  Allergic reaction   Adult female presenting with itchy rash consistent with hives/urticaria - no identified allergen  Medications provided. Condition improving. No airway involvement. No anaphylaxis. Vital signs and nursing notes reviewed and considered     Teressa Lower, MD 03/24/13 [phone]

## 2013-03-24 NOTE — ED Notes (Signed)
X 2 weeks of progressive rash, welts, itchy all over body, feeling weak, tired. The rash started in/under her nose. Bilateral lymph nodes swollen.  Hard to swallow.

## 2013-03-24 NOTE — ED Notes (Signed)
Itching much less now, redness continues.  O2 sat steady @98 % room air.

## 2013-03-24 NOTE — ED Notes (Signed)
Rash began yesterday morning approx 0530.  Has become increasingly itchy as day wears on.

## 2013-03-24 NOTE — Discharge Instructions (Signed)
Allergies °Allergies may happen from anything your body is sensitive to. This may be food, medicines, pollens, chemicals, and nearly anything around you in everyday life that produces allergens. An allergen is anything that causes an allergy producing substance. Heredity is often a factor in causing these problems. This means you may have some of the same allergies as your parents. °Food allergies happen in all age groups. Food allergies are some of the most severe and life threatening. Some common food allergies are cow's milk, seafood, eggs, nuts, wheat, and soybeans. °SYMPTOMS  °· Swelling around the mouth. °· An itchy red rash or hives. °· Vomiting or diarrhea. °· Difficulty breathing. °SEVERE ALLERGIC REACTIONS ARE LIFE-THREATENING. °This reaction is called anaphylaxis. It can cause the mouth and throat to swell and cause difficulty with breathing and swallowing. In severe reactions only a trace amount of food (for example, peanut oil in a salad) may cause death within seconds. °Seasonal allergies occur in all age groups. These are seasonal because they usually occur during the same season every year. They may be a reaction to molds, grass pollens, or tree pollens. Other causes of problems are house dust mite allergens, pet dander, and mold spores. The symptoms often consist of nasal congestion, a runny itchy nose associated with sneezing, and tearing itchy eyes. There is often an associated itching of the mouth and ears. The problems happen when you come in contact with pollens and other allergens. Allergens are the particles in the air that the body reacts to with an allergic reaction. This causes you to release allergic antibodies. Through a chain of events, these eventually cause you to release histamine into the blood stream. Although it is meant to be protective to the body, it is this release that causes your discomfort. This is why you were given anti-histamines to feel better.  If you are unable to  pinpoint the offending allergen, it may be determined by skin or blood testing. Allergies cannot be cured but can be controlled with medicine. °Hay fever is a collection of all or some of the seasonal allergy problems. It may often be treated with simple over-the-counter medicine such as diphenhydramine. Take medicine as directed. Do not drink alcohol or drive while taking this medicine. Check with your caregiver or package insert for child dosages. °If these medicines are not effective, there are many new medicines your caregiver can prescribe. Stronger medicine such as nasal spray, eye drops, and corticosteroids may be used if the first things you try do not work well. Other treatments such as immunotherapy or desensitizing injections can be used if all else fails. Follow up with your caregiver if problems continue. These seasonal allergies are usually not life threatening. They are generally more of a nuisance that can often be handled using medicine. °HOME CARE INSTRUCTIONS  °· If unsure what causes a reaction, keep a diary of foods eaten and symptoms that follow. Avoid foods that cause reactions. °· If hives or rash are present: °· Take medicine as directed. °· You may use an over-the-counter antihistamine (diphenhydramine) for hives and itching as needed. °· Apply cold compresses (cloths) to the skin or take baths in cool water. Avoid hot baths or showers. Heat will make a rash and itching worse. °· If you are severely allergic: °· Following a treatment for a severe reaction, hospitalization is often required for closer follow-up. °· Wear a medic-alert bracelet or necklace stating the allergy. °· You and your family must learn how to give adrenaline or use   an anaphylaxis kit. °· If you have had a severe reaction, always carry your anaphylaxis kit or EpiPen® with you. Use this medicine as directed by your caregiver if a severe reaction is occurring. Failure to do so could have a fatal outcome. °SEEK MEDICAL  CARE IF: °· You suspect a food allergy. Symptoms generally happen within 30 minutes of eating a food. °· Your symptoms have not gone away within 2 days or are getting worse. °· You develop new symptoms. °· You want to retest yourself or your child with a food or drink you think causes an allergic reaction. Never do this if an anaphylactic reaction to that food or drink has happened before. Only do this under the care of a caregiver. °SEEK IMMEDIATE MEDICAL CARE IF:  °· You have difficulty breathing, are wheezing, or have a tight feeling in your chest or throat. °· You have a swollen mouth, or you have hives, swelling, or itching all over your body. °· You have had a severe reaction that has responded to your anaphylaxis kit or an EpiPen®. These reactions may return when the medicine has worn off. These reactions should be considered life threatening. °MAKE SURE YOU:  °· Understand these instructions. °· Will watch your condition. °· Will get help right away if you are not doing well or get worse. °Document Released: 03/31/2002 Document Revised: 05/02/2012 Document Reviewed: 09/05/2007 °[employer]® Patient Information ©2014 [employer]. ° °

## 2013-03-24 NOTE — ED Notes (Signed)
Instructions reviewed several times to ensure pt knows to complete her medications as prescribed.  Pt verbalized and repeated back instructions.

## 2013-03-24 NOTE — ED Notes (Signed)
Patient sleeping at this time.

## 2013-04-03 ENCOUNTER — Other Ambulatory Visit: Payer: Self-pay | Admitting: Neurology

## 2013-04-03 MED ORDER — PREGABALIN 100 MG PO CAPS: 100.0000 mg | ORAL_CAPSULE | Freq: Two times a day (BID) | ORAL | Status: DC

## 2013-04-20 ENCOUNTER — Ambulatory Visit: Payer: Medicaid Other

## 2013-04-20 ENCOUNTER — Ambulatory Visit
Admission: RE | Admit: 2013-04-20 | Discharge: 2013-04-20 | Disposition: A | Discharge status: Home / Self Care | Payer: Medicaid Other | Source: Ambulatory Visit

## 2013-04-20 DIAGNOSIS — Z1231 Encounter for screening mammogram for malignant neoplasm of breast: Secondary | ICD-10-CM

## 2013-11-20 ENCOUNTER — Encounter (HOSPITAL_COMMUNITY): Payer: Self-pay | Admitting: Emergency Medicine

## 2014-03-21 ENCOUNTER — Other Ambulatory Visit: Payer: Self-pay

## 2014-03-21 DIAGNOSIS — Z1231 Encounter for screening mammogram for malignant neoplasm of breast: Secondary | ICD-10-CM

## 2014-04-27 ENCOUNTER — Ambulatory Visit
Admission: RE | Admit: 2014-04-27 | Discharge: 2014-04-27 | Disposition: A | Discharge status: Home / Self Care | Payer: Medicaid Other | Source: Ambulatory Visit

## 2014-04-27 DIAGNOSIS — Z1231 Encounter for screening mammogram for malignant neoplasm of breast: Secondary | ICD-10-CM

## 2014-10-10 ENCOUNTER — Other Ambulatory Visit (HOSPITAL_COMMUNITY): Payer: Self-pay | Admitting: Internal Medicine

## 2014-10-12 ENCOUNTER — Other Ambulatory Visit (HOSPITAL_COMMUNITY): Payer: Self-pay | Admitting: Internal Medicine

## 2014-10-12 DIAGNOSIS — K469 Unspecified abdominal hernia without obstruction or gangrene: Secondary | ICD-10-CM

## 2014-10-12 DIAGNOSIS — R1032 Left lower quadrant pain: Secondary | ICD-10-CM

## 2014-10-31 ENCOUNTER — Ambulatory Visit (HOSPITAL_COMMUNITY)
Admission: RE | Admit: 2014-10-31 | Discharge: 2014-10-31 | Disposition: A | Discharge status: Home / Self Care | Payer: Medicaid Other | Source: Ambulatory Visit | Attending: Internal Medicine | Admitting: Internal Medicine

## 2014-10-31 ENCOUNTER — Encounter (HOSPITAL_COMMUNITY): Payer: Self-pay

## 2014-10-31 DIAGNOSIS — K469 Unspecified abdominal hernia without obstruction or gangrene: Secondary | ICD-10-CM

## 2014-10-31 DIAGNOSIS — K8020 Calculus of gallbladder without cholecystitis without obstruction: Secondary | ICD-10-CM | POA: Insufficient documentation

## 2014-10-31 DIAGNOSIS — R1032 Left lower quadrant pain: Secondary | ICD-10-CM | POA: Insufficient documentation

## 2014-10-31 MED ORDER — IOHEXOL 300 MG/ML  SOLN
100.0000 mL | Freq: Once | INTRAMUSCULAR | Status: AC | PRN
  Administered 2014-10-31: 100 mL via INTRAVENOUS

## 2015-01-22 IMAGING — CR DG HAND COMPLETE 3+V*L*
3 series · 3 of 3 positions shown · non-contrast
Comparison: None.

CLINICAL DATA: Fall.  Left hand pain.

LEFT HAND - COMPLETE 3+ VIEW

[view not recorded (1 of 3)]
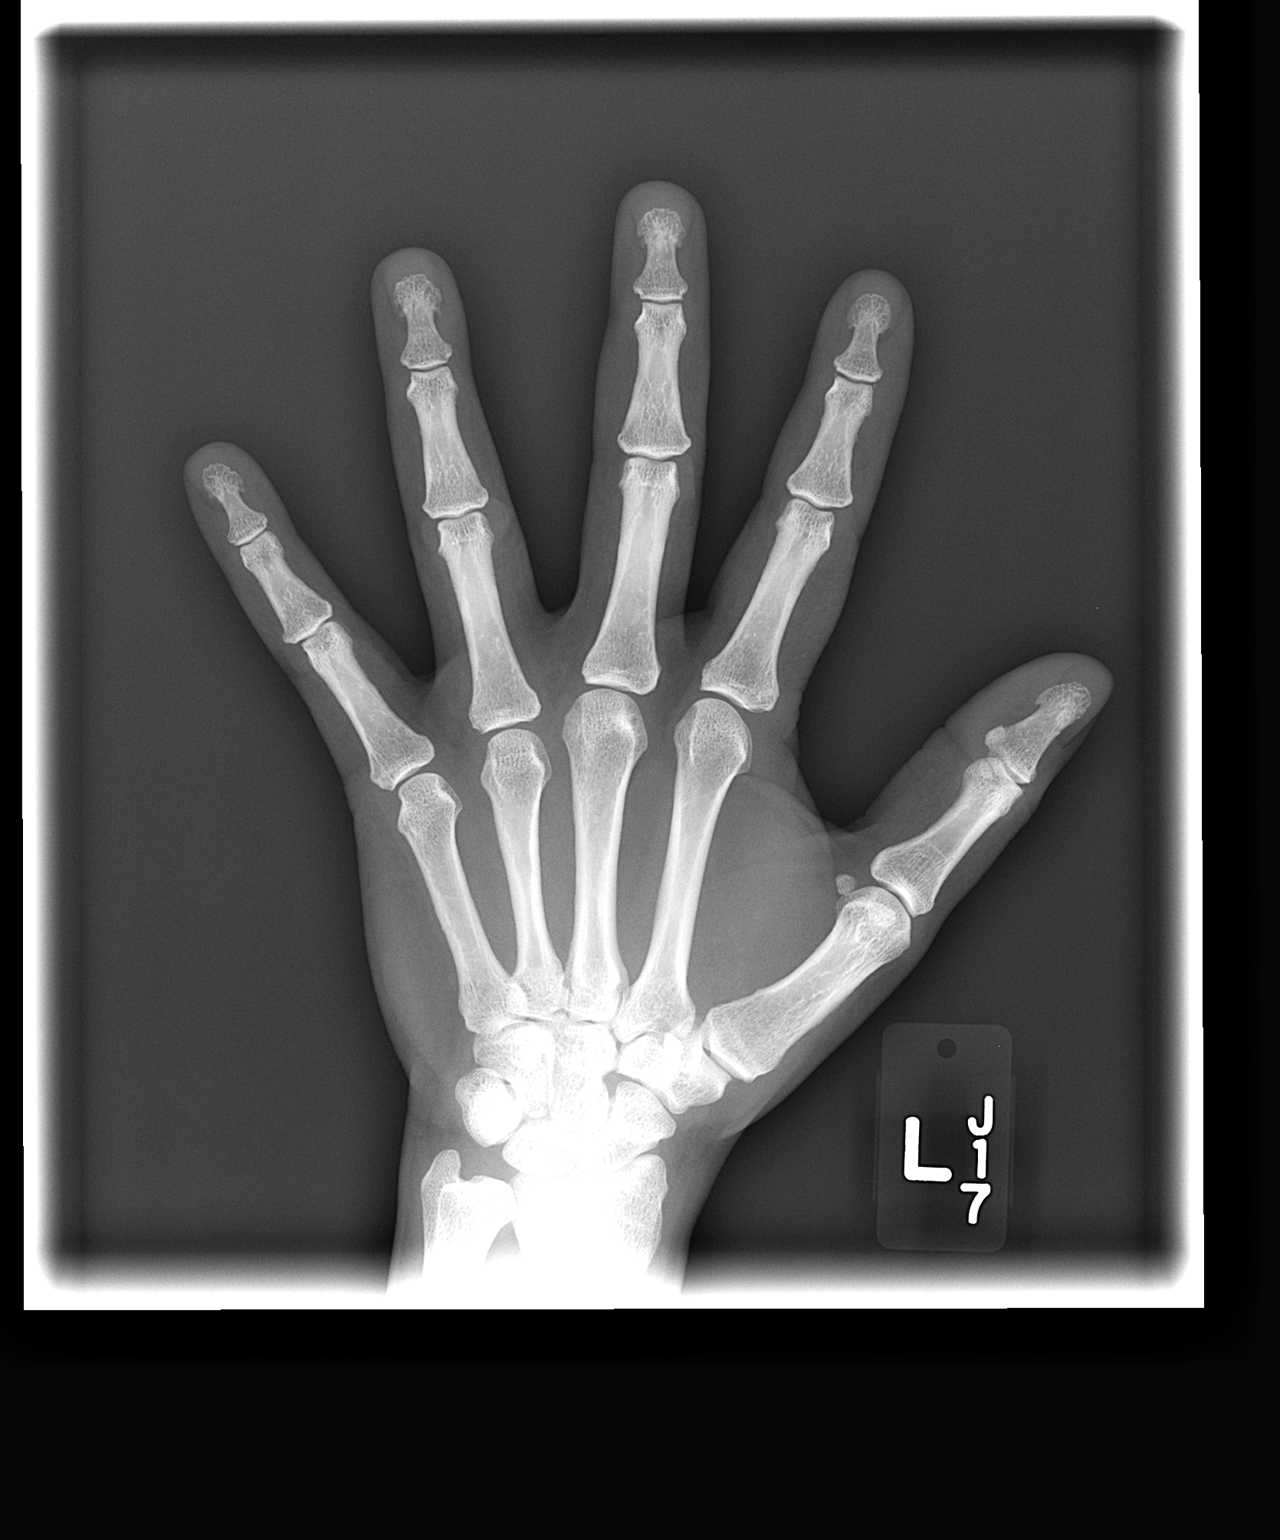

[view not recorded (2 of 3)]
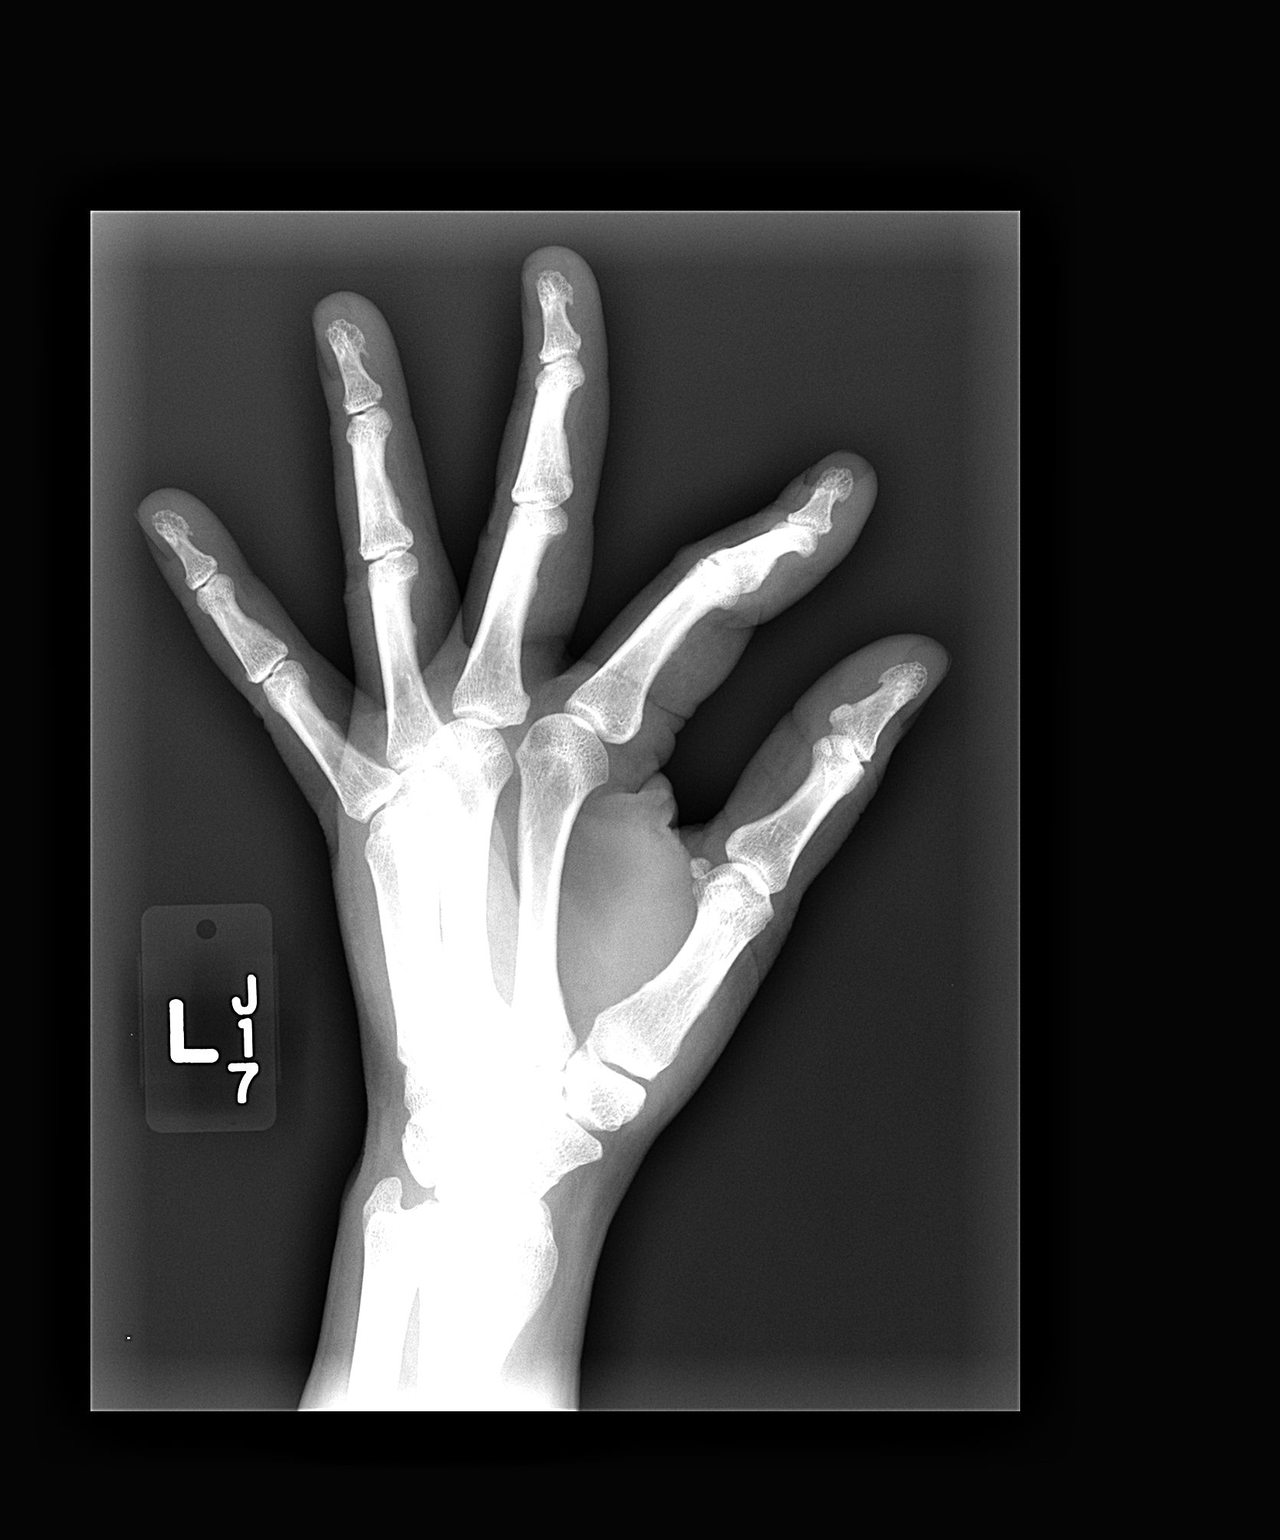

[view not recorded (3 of 3)]
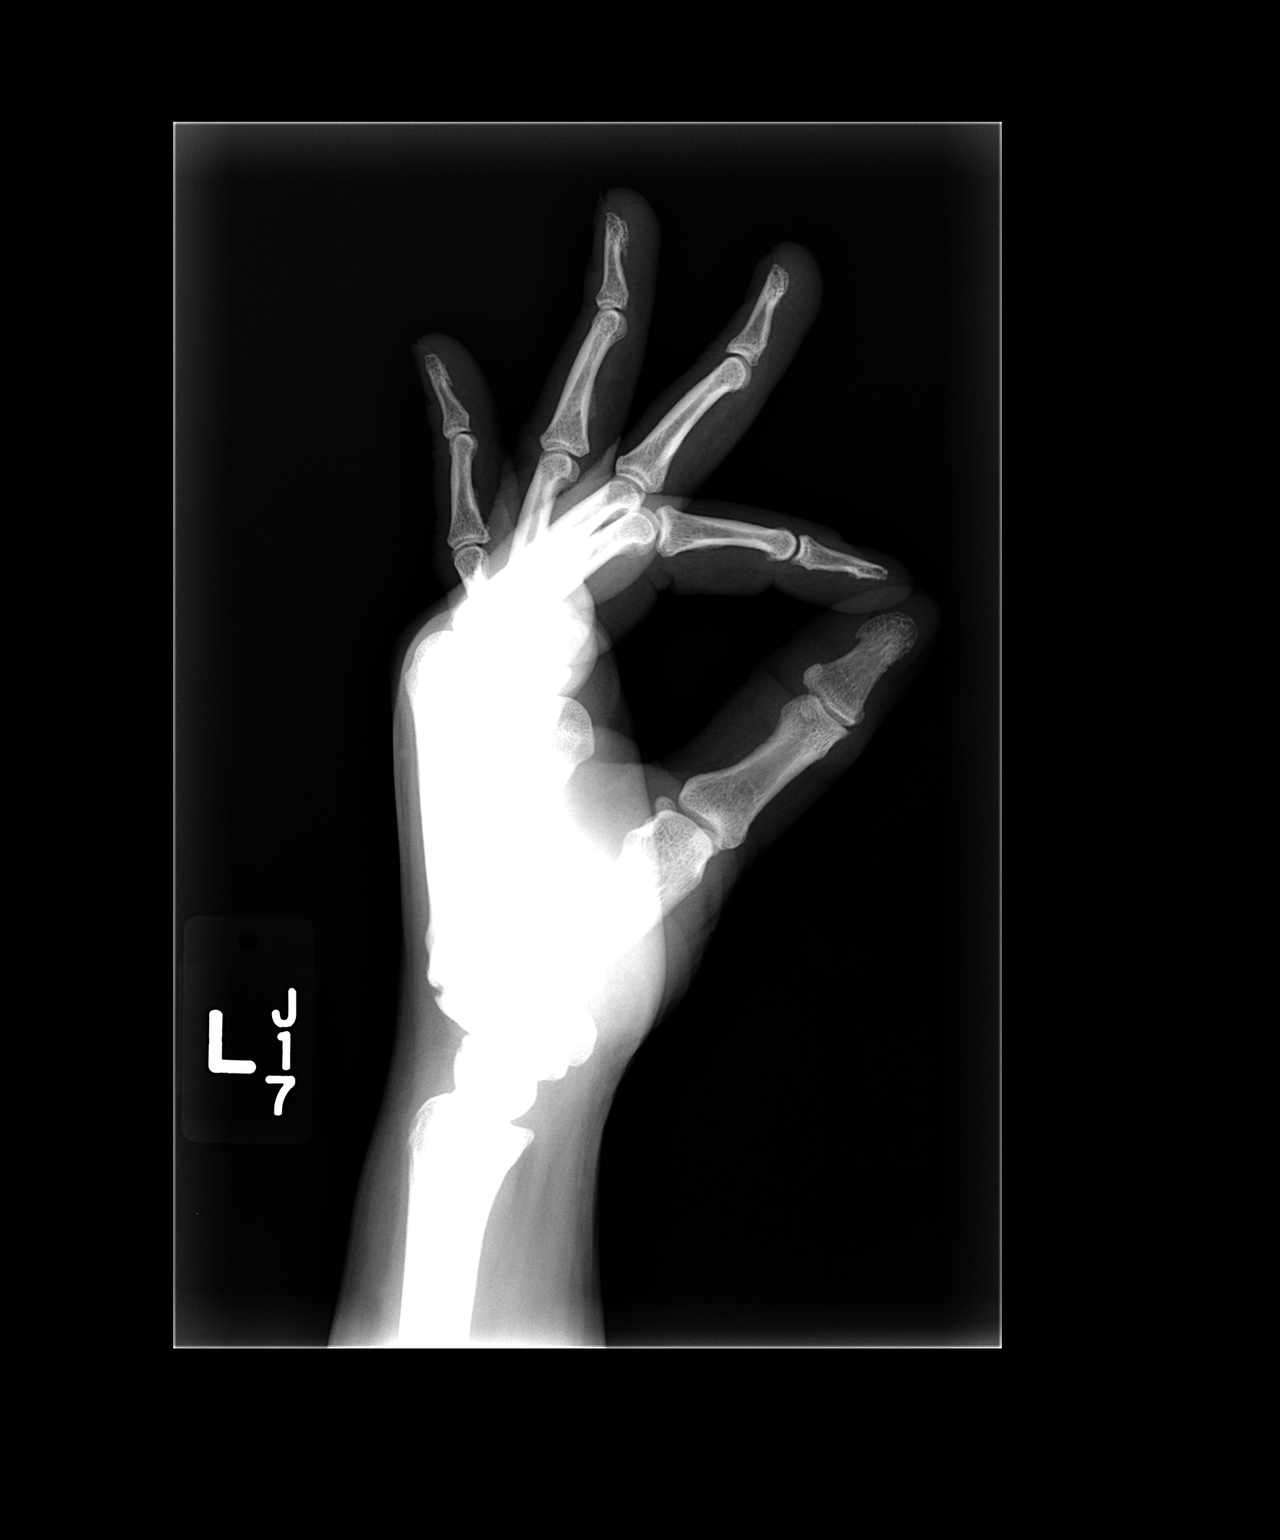

[3 of 3 positions shown; findings below may reference images not displayed]

FINDINGS: Three-view exam shows no fracture.  No subluxation or
dislocation.  No worrisome lytic or sclerotic osseous abnormality.
IMPRESSION: No acute bony findings.

## 2015-03-11 ENCOUNTER — Other Ambulatory Visit: Payer: Self-pay

## 2015-03-11 DIAGNOSIS — Z1231 Encounter for screening mammogram for malignant neoplasm of breast: Secondary | ICD-10-CM

## 2015-04-30 ENCOUNTER — Ambulatory Visit: Payer: Medicaid Other

## 2015-05-03 ENCOUNTER — Ambulatory Visit: Payer: Medicaid Other

## 2015-05-08 ENCOUNTER — Emergency Department (HOSPITAL_COMMUNITY)
Admission: EM | Admit: 2015-05-08 | Discharge: 2015-05-09 | Disposition: A | Discharge status: Home / Self Care | Payer: Medicaid Other | Attending: Emergency Medicine | Admitting: Emergency Medicine

## 2015-05-08 ENCOUNTER — Encounter (HOSPITAL_COMMUNITY): Payer: Self-pay | Admitting: *Deleted

## 2015-05-08 DIAGNOSIS — Z3202 Encounter for pregnancy test, result negative: Secondary | ICD-10-CM | POA: Insufficient documentation

## 2015-05-08 DIAGNOSIS — N644 Mastodynia: Secondary | ICD-10-CM | POA: Diagnosis present

## 2015-05-08 DIAGNOSIS — M797 Fibromyalgia: Secondary | ICD-10-CM | POA: Diagnosis not present

## 2015-05-08 DIAGNOSIS — E669 Obesity, unspecified: Secondary | ICD-10-CM | POA: Insufficient documentation

## 2015-05-08 DIAGNOSIS — Z8719 Personal history of other diseases of the digestive system: Secondary | ICD-10-CM | POA: Insufficient documentation

## 2015-05-08 DIAGNOSIS — Z79899 Other long term (current) drug therapy: Secondary | ICD-10-CM | POA: Insufficient documentation

## 2015-05-08 DIAGNOSIS — Z8659 Personal history of other mental and behavioral disorders: Secondary | ICD-10-CM | POA: Insufficient documentation

## 2015-05-08 LAB — COMPREHENSIVE METABOLIC PANEL
ALBUMIN: 3.8 g/dL (ref 3.5–5.0)
ALT: 19 U/L (ref 14–54)
ANION GAP: 8 (ref 5–15)
AST: 20 U/L (ref 15–41)
Alkaline Phosphatase: 58 U/L (ref 38–126)
BILIRUBIN TOTAL: 0.6 mg/dL (ref 0.3–1.2)
BUN: 12 mg/dL (ref 6–20)
CHLORIDE: 105 mmol/L (ref 101–111)
CO2: 28 mmol/L (ref 22–32)
Calcium: 9.8 mg/dL (ref 8.9–10.3)
Creatinine, Ser: 0.49 mg/dL (ref 0.44–1.00)
GFR calc Af Amer: >60 mL/min (ref >60)
GFR calc non Af Amer: >60 mL/min (ref >60)
GLUCOSE: 103 mg/dL — AB (ref 65–99)
POTASSIUM: 3.7 mmol/L (ref 3.5–5.1)
Sodium: 141 mmol/L (ref 135–145)
TOTAL PROTEIN: 7 g/dL (ref 6.5–8.1)

## 2015-05-08 LAB — CBC WITH DIFFERENTIAL/PLATELET
BASOS ABS: 0 10*3/uL (ref 0.0–0.1)
BASOS PCT: 0 %
Eosinophils Absolute: 0.1 10*3/uL (ref 0.0–0.7)
Eosinophils Relative: 2 %
HEMATOCRIT: 38.5 % (ref 36.0–46.0)
Hemoglobin: 12.2 g/dL (ref 12.0–15.0)
LYMPHS PCT: 39 %
Lymphs Abs: 2.2 10*3/uL (ref 0.7–4.0)
MCH: 28.6 pg (ref 26.0–34.0)
MCHC: 31.7 g/dL (ref 30.0–36.0)
MCV: 90.2 fL (ref 78.0–100.0)
MONOS PCT: 6 %
Monocytes Absolute: 0.3 10*3/uL (ref 0.1–1.0)
NEUTROS ABS: 3 10*3/uL (ref 1.7–7.7)
Neutrophils Relative %: 53 %
Platelets: 251 10*3/uL (ref 150–400)
RBC: 4.27 MIL/uL (ref 3.87–5.11)
RDW: 12.9 % (ref 11.5–15.5)
WBC: 5.6 10*3/uL (ref 4.0–10.5)

## 2015-05-08 LAB — I-STAT CG4 LACTIC ACID, ED: LACTIC ACID, VENOUS: 0.43 mmol/L — AB (ref 0.5–2.0)

## 2015-05-08 NOTE — ED Notes (Signed)
Called for VS, no answer

## 2015-05-08 NOTE — ED Notes (Signed)
The pt is c/o rt breast pain with redness for approx 5 days she thinks she has had a temp.  No drainage just pain and redness  lmp 3 weks ago

## 2015-05-09 ENCOUNTER — Other Ambulatory Visit: Payer: Self-pay | Admitting: Emergency Medicine

## 2015-05-09 ENCOUNTER — Other Ambulatory Visit: Payer: Self-pay | Admitting: Internal Medicine

## 2015-05-09 DIAGNOSIS — N644 Mastodynia: Secondary | ICD-10-CM

## 2015-05-09 LAB — POC URINE PREG, ED: Preg Test, Ur: NEGATIVE

## 2015-05-09 MED ORDER — KETOROLAC TROMETHAMINE 30 MG/ML IJ SOLN
30.0000 mg | Freq: Once | INTRAMUSCULAR | Status: DC
  Filled 2015-05-09: qty 1

## 2015-05-09 MED ORDER — IBUPROFEN 400 MG PO TABS
600.0000 mg | ORAL_TABLET | Freq: Once | ORAL | Status: AC
  Administered 2015-05-09: 600 mg via ORAL
  Filled 2015-05-09: qty 1

## 2015-05-09 MED ORDER — HYDROCODONE-ACETAMINOPHEN 5-325 MG PO TABS: 1.0000 | ORAL_TABLET | ORAL | Status: DC | PRN

## 2015-05-09 NOTE — ED Notes (Signed)
MD at bedside. 

## 2015-05-09 NOTE — Discharge Instructions (Signed)

## 2015-05-09 NOTE — ED Notes (Addendum)
Patient verbalized understanding of discharge instructions and denies any further needs or questions at this time. VS stable. 

## 2015-05-09 NOTE — ED Notes (Signed)
Patient woken up twice in the last 15 minutes, told to get dressed and that she has been discharged. Pt slow to get out of bed and start getting dressed.Kristin Joseph

## 2015-05-09 NOTE — ED Provider Notes (Signed)
CSN: [id number]     Arrival date & time 05/08/15  2018 History   First MD Initiated Contact with Patient 05/09/15 0057     Chief Complaint  Patient presents with  . Breast Pain     (Consider location/radiation/quality/duration/timing/severity/associated sxs/prior Treatment) HPI  This a 45 year old female who presents with right breast tenderness. Patient reports one month of intermittent symptoms. She states that she thought it was getting better but then 1 week ago it began to get worse again. She states that she is having difficulty sleeping at night. She states that the pain is sharp and nonradiating. She has not noted any lumps. She's not noted any nipple discharge. She states that she felt warm when the pain gets bad. Currently she rates her pain at 10 out of 10. She has taken over-the-counter pain medication without relief.   Past Medical History  Diagnosis Date  . Depression   . Stress headaches   . Pelvic pain   . Back pain   . Vitamin D deficiency   . Gastritis 06/28/2012    Mild  . Fibromyalgia 10/25/2012  . Obesity    Past Surgical History  Procedure Laterality Date  . Foot surgery Bilateral     Plantar fasciitis  . Appendectomy    . Thyroidectomy, partial     Family History  Problem Relation Age of Onset  . Depression Mother   . Colon cancer Neg Hx    Social History  Substance Use Topics  . Smoking status: Never Smoker   . Smokeless tobacco: Never Used  . Alcohol Use: No   OB History    Gravida Para Term Preterm AB TAB SAB Ectopic Multiple Living   4 3 3  1  1   3      Review of Systems  Constitutional: Negative for fever.  Respiratory: Negative for chest tightness and shortness of breath.   Cardiovascular: Negative for chest pain.       Rest pain  Skin: Negative for rash.  All other systems reviewed and are negative.     Allergies  Review of patient's allergies indicates no known allergies.  Home Medications   Prior to Admission medications    Medication Sig Start Date End Date Taking? Authorizing Provider  indomethacin (INDOCIN) 50 MG capsule Take 50 mg by mouth 3 (three) times daily. 04/28/15  Yes Historical Provider, MD  diphenhydrAMINE-zinc acetate (BENADRYL) cream Apply topically 3 (three) times daily as needed for itching. Patient not taking: Reported on 05/09/2015 03/24/13   Teressa Lower, MD  famotidine (PEPCID) 40 MG tablet Take 1 tablet (40 mg total) by mouth daily. Patient not taking: Reported on 05/09/2015 03/24/13   Teressa Lower, MD  HYDROcodone-acetaminophen (NORCO/VICODIN) 5-325 MG tablet Take 1-2 tablets by mouth every 4 (four) hours as needed. 05/09/15   Merryl Hacker, MD  predniSONE (DELTASONE) 20 MG tablet Take 3 tablets (60 mg total) by mouth daily. Patient not taking: Reported on 05/09/2015 03/24/13   Teressa Lower, MD  pregabalin (LYRICA) 100 MG capsule Take 1 capsule (100 mg total) by mouth 2 (two) times daily. Patient not taking: Reported on 05/09/2015 04/03/13   Kathrynn Ducking, MD   BP 117/68 mmHg  Pulse 71  Temp(Src) 97.9 F (36.6 C) (Oral)  Resp 16  Ht 5\' 7"  (1.702 m)  Wt 175 lb 3.2 oz (79.47 kg)  BMI 27.43 kg/m2  SpO2 100%  LMP 04/17/2015 Physical Exam  Constitutional: She is oriented to person, place, and time. She appears  well-developed and well-nourished. No distress.  HENT:  Head: Normocephalic and atraumatic.  Cardiovascular: Normal rate, regular rhythm and normal heart sounds.   No murmur heard. Pulmonary/Chest: Effort normal and breath sounds normal. No respiratory distress. She has no wheezes. Right breast exhibits no inverted nipple, no mass, no nipple discharge, no skin change and no tenderness.  No overlying skin change, warmth, or erythema.  TTP about the areola.  Neurological: She is alert and oriented to person, place, and time.  Skin: Skin is warm and dry.  Psychiatric: She has a normal mood and affect.  Nursing note and vitals reviewed.   ED Course  Procedures (including critical care  time) Labs Review Labs Reviewed  COMPREHENSIVE METABOLIC PANEL - Abnormal; Notable for the following:    Glucose, Bld 103 (*)    All other components within normal limits  I-STAT CG4 LACTIC ACID, ED - Abnormal; Notable for the following:    Lactic Acid, Venous 0.43 (*)    All other components within normal limits  CBC WITH DIFFERENTIAL/PLATELET  POC URINE PREG, ED    Imaging Review No results found. I have personally reviewed and evaluated these images and lab results as part of my medical decision-making.   EKG Interpretation None      MDM   Final diagnoses:  Breast pain    She presents with right breast pain. Ongoing but worsening over the last month. Breast exam is essentially normal. She does have some tenderness to palpation but no overlying skin changes, nipple inversion, nipple discharge, or mass. Will refer to the breast imaging center.  After history, exam, and medical workup I feel the patient has been appropriately medically screened and is safe for discharge home. Pertinent diagnoses were discussed with the patient. Patient was given return precautions.     Merryl Hacker, MD 05/09/15 [phone]

## 2015-05-09 NOTE — ED Notes (Signed)
Pt c/o pain around areola of R breast intermittent x 1 month - pt states it happens for about a week then comes back, but usually goes away, except not this time. Pt also c/o redness around areola as well as swelling sometimes - no redness or swelling noted at this time.

## 2015-05-13 ENCOUNTER — Ambulatory Visit
Admission: RE | Admit: 2015-05-13 | Discharge: 2015-05-13 | Disposition: A | Discharge status: Home / Self Care | Payer: Medicaid Other | Source: Ambulatory Visit | Attending: Internal Medicine | Admitting: Internal Medicine

## 2015-05-13 ENCOUNTER — Other Ambulatory Visit: Payer: Self-pay | Admitting: Internal Medicine

## 2015-05-13 DIAGNOSIS — N644 Mastodynia: Secondary | ICD-10-CM

## 2015-05-21 ENCOUNTER — Ambulatory Visit: Payer: Medicaid Other

## 2015-09-19 ENCOUNTER — Emergency Department (HOSPITAL_COMMUNITY): Payer: Medicaid Other

## 2015-09-19 ENCOUNTER — Encounter (HOSPITAL_COMMUNITY): Payer: Self-pay | Admitting: Emergency Medicine

## 2015-09-19 ENCOUNTER — Emergency Department (HOSPITAL_COMMUNITY)
Admission: EM | Admit: 2015-09-19 | Discharge: 2015-09-19 | Disposition: A | Discharge status: Home / Self Care | Payer: Medicaid Other | Attending: Emergency Medicine | Admitting: Emergency Medicine

## 2015-09-19 DIAGNOSIS — Y9389 Activity, other specified: Secondary | ICD-10-CM | POA: Insufficient documentation

## 2015-09-19 DIAGNOSIS — M25512 Pain in left shoulder: Secondary | ICD-10-CM | POA: Diagnosis not present

## 2015-09-19 DIAGNOSIS — R51 Headache: Secondary | ICD-10-CM | POA: Diagnosis not present

## 2015-09-19 DIAGNOSIS — S161XXA Strain of muscle, fascia and tendon at neck level, initial encounter: Secondary | ICD-10-CM | POA: Diagnosis not present

## 2015-09-19 DIAGNOSIS — Y92410 Unspecified street and highway as the place of occurrence of the external cause: Secondary | ICD-10-CM | POA: Diagnosis not present

## 2015-09-19 DIAGNOSIS — Y999 Unspecified external cause status: Secondary | ICD-10-CM | POA: Insufficient documentation

## 2015-09-19 DIAGNOSIS — R079 Chest pain, unspecified: Secondary | ICD-10-CM | POA: Insufficient documentation

## 2015-09-19 DIAGNOSIS — S169XXA Unspecified injury of muscle, fascia and tendon at neck level, initial encounter: Secondary | ICD-10-CM | POA: Diagnosis present

## 2015-09-19 MED ORDER — IBUPROFEN 800 MG PO TABS: 800.0000 mg | ORAL_TABLET | Freq: Three times a day (TID) | ORAL | 0 refills | Status: DC | PRN

## 2015-09-19 MED ORDER — HYDROCODONE-ACETAMINOPHEN 5-325 MG PO TABS: 1.0000 | ORAL_TABLET | Freq: Four times a day (QID) | ORAL | 0 refills | Status: DC | PRN

## 2015-09-19 MED ORDER — HYDROCODONE-ACETAMINOPHEN 5-325 MG PO TABS
1.0000 | ORAL_TABLET | Freq: Once | ORAL | Status: AC
  Administered 2015-09-19: 1 via ORAL
  Filled 2015-09-19: qty 1
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
## 2015-09-19 NOTE — ED Triage Notes (Signed)
Pt found up in room, changing into gown and using restroom. Pt assisted back to bed. All metal objects removed  As directed by radiology

## 2015-09-19 NOTE — Discharge Instructions (Signed)
Return here as needed. Follow up with your primary doctor. Your x-rays were normal
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
## 2015-12-18 ENCOUNTER — Ambulatory Visit: Payer: Medicaid Other | Attending: Orthopedic Surgery | Admitting: Physical Therapy

## 2015-12-18 ENCOUNTER — Encounter: Payer: Self-pay | Admitting: Physical Therapy

## 2015-12-18 DIAGNOSIS — M25512 Pain in left shoulder: Secondary | ICD-10-CM
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
## 2016-03-03 ENCOUNTER — Other Ambulatory Visit: Payer: Self-pay | Admitting: Internal Medicine

## 2016-03-03 DIAGNOSIS — Z1231 Encounter for screening mammogram for malignant neoplasm of breast: Secondary | ICD-10-CM

## 2016-05-14 ENCOUNTER — Ambulatory Visit: Payer: Medicaid Other

## 2016-05-22 ENCOUNTER — Ambulatory Visit: Payer: Medicaid Other

## 2016-06-17 ENCOUNTER — Ambulatory Visit
Admission: RE | Admit: 2016-06-17 | Discharge: 2016-06-17 | Disposition: A | Discharge status: Home / Self Care | Payer: Medicaid Other | Source: Ambulatory Visit | Attending: Internal Medicine | Admitting: Internal Medicine

## 2016-06-17 ENCOUNTER — Ambulatory Visit: Payer: Medicaid Other

## 2016-06-17 DIAGNOSIS — Z1231 Encounter for screening mammogram for malignant neoplasm of breast: Secondary | ICD-10-CM

## 2016-10-14 ENCOUNTER — Emergency Department (HOSPITAL_COMMUNITY)
Admission: EM | Admit: 2016-10-14 | Discharge: 2016-10-14 | Disposition: A | Discharge status: Home / Self Care | Payer: Medicaid Other | Attending: Emergency Medicine | Admitting: Emergency Medicine

## 2016-10-14 ENCOUNTER — Emergency Department (HOSPITAL_COMMUNITY): Payer: Medicaid Other

## 2016-10-14 ENCOUNTER — Encounter (HOSPITAL_COMMUNITY): Payer: Self-pay | Admitting: Emergency Medicine

## 2016-10-14 DIAGNOSIS — M791 Myalgia: Secondary | ICD-10-CM | POA: Insufficient documentation

## 2016-10-14 DIAGNOSIS — M549 Dorsalgia, unspecified: Secondary | ICD-10-CM | POA: Insufficient documentation

## 2016-10-14 DIAGNOSIS — Z79899 Other long term (current) drug therapy: Secondary | ICD-10-CM | POA: Diagnosis not present

## 2016-10-14 DIAGNOSIS — R51 Headache: Secondary | ICD-10-CM | POA: Diagnosis present

## 2016-10-14 DIAGNOSIS — M542 Cervicalgia: Secondary | ICD-10-CM | POA: Diagnosis not present

## 2016-10-14 DIAGNOSIS — Z041 Encounter for examination and observation following transport accident: Secondary | ICD-10-CM | POA: Insufficient documentation

## 2016-10-14 DIAGNOSIS — M7918 Myalgia, other site: Secondary | ICD-10-CM

## 2016-10-14 MED ORDER — METHOCARBAMOL 500 MG PO TABS: 500.0000 mg | ORAL_TABLET | Freq: Four times a day (QID) | ORAL | 0 refills | Status: DC

## 2016-10-14 MED ORDER — NAPROXEN 500 MG PO TABS: 500.0000 mg | ORAL_TABLET | Freq: Two times a day (BID) | ORAL | 0 refills | Status: DC
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
## 2016-10-26 ENCOUNTER — Encounter (HOSPITAL_COMMUNITY): Payer: Self-pay | Admitting: *Deleted

## 2016-10-26 ENCOUNTER — Ambulatory Visit (HOSPITAL_COMMUNITY)
Admission: EM | Admit: 2016-10-26 | Discharge: 2016-10-26 | Disposition: A | Discharge status: Home / Self Care | Payer: Medicaid Other | Attending: Urgent Care | Admitting: Urgent Care

## 2016-10-26 DIAGNOSIS — M797 Fibromyalgia: Secondary | ICD-10-CM

## 2016-10-26 DIAGNOSIS — M5416 Radiculopathy, lumbar region: Secondary | ICD-10-CM

## 2016-10-26 DIAGNOSIS — M5489 Other dorsalgia: Secondary | ICD-10-CM | POA: Diagnosis not present

## 2016-10-26 DIAGNOSIS — M549 Dorsalgia, unspecified: Secondary | ICD-10-CM

## 2016-10-26 DIAGNOSIS — M542 Cervicalgia: Secondary | ICD-10-CM

## 2016-10-26 DIAGNOSIS — M5410 Radiculopathy, site unspecified: Secondary | ICD-10-CM

## 2016-10-26 MED ORDER — KETOROLAC TROMETHAMINE 60 MG/2ML IM SOLN
INTRAMUSCULAR | Status: AC
  Filled 2016-10-26: qty 2

## 2016-10-26 MED ORDER — KETOROLAC TROMETHAMINE 60 MG/2ML IM SOLN
60.0000 mg | Freq: Once | INTRAMUSCULAR | Status: AC
  Administered 2016-10-26: 60 mg via INTRAMUSCULAR
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
## 2016-10-27 ENCOUNTER — Ambulatory Visit: Payer: Medicaid Other | Attending: Primary Care

## 2016-10-27 DIAGNOSIS — R293 Abnormal posture: Secondary | ICD-10-CM | POA: Insufficient documentation

## 2016-10-27 DIAGNOSIS — M5442 Lumbago with sciatica, left side: Secondary | ICD-10-CM | POA: Insufficient documentation

## 2016-10-27 DIAGNOSIS — M542 Cervicalgia: Secondary | ICD-10-CM | POA: Insufficient documentation

## 2016-10-27 DIAGNOSIS — M5441 Lumbago with sciatica, right side: Secondary | ICD-10-CM | POA: Diagnosis present

## 2016-10-27 DIAGNOSIS — M62838 Other muscle spasm: Secondary | ICD-10-CM | POA: Diagnosis present

## 2016-10-27 DIAGNOSIS — M25512 Pain in left shoulder: Secondary | ICD-10-CM | POA: Insufficient documentation
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
## 2016-10-28 ENCOUNTER — Encounter (HOSPITAL_COMMUNITY): Payer: Self-pay | Admitting: Urgent Care

## 2016-10-28 ENCOUNTER — Encounter: Payer: Medicaid Other | Admitting: Physical Therapy

## 2016-11-02 ENCOUNTER — Ambulatory Visit: Payer: Medicaid Other

## 2016-11-09 ENCOUNTER — Ambulatory Visit: Payer: Medicaid Other

## 2016-11-09 DIAGNOSIS — M5442 Lumbago with sciatica, left side: Secondary | ICD-10-CM | POA: Diagnosis not present

## 2016-11-09 DIAGNOSIS — M25512 Pain in left shoulder: Secondary | ICD-10-CM

## 2016-11-09 DIAGNOSIS — M62838 Other muscle spasm: Secondary | ICD-10-CM

## 2016-11-09 DIAGNOSIS — M542 Cervicalgia: Secondary | ICD-10-CM

## 2016-11-09 DIAGNOSIS — R293 Abnormal posture: Secondary | ICD-10-CM

## 2016-11-09 DIAGNOSIS — M5441 Lumbago with sciatica, right side: Secondary | ICD-10-CM

## 2016-11-09 NOTE — Therapy (Signed)
[place], [place], [address] Phone: [phone]   Fax:  [phone]  Physical Therapy Treatment  Patient Details  Name: Kristin Joseph MRN: [id number] Date of Birth: 10-04-1970 Referring Provider: Kevan Ny, MD  Encounter Date: 11/09/2016      PT End of Session - 11/09/16 1203    Visit Number 2   Number of Visits 4   Date for PT Re-Evaluation 11/20/16   PT Start Time 1205   PT Stop Time 2993   PT Time Calculation (min) 60 min   Activity Tolerance Patient limited by pain   Behavior During Therapy [facility] for tasks assessed/performed;Anxious      Past Medical History:  Diagnosis Date  . Back pain   . Depression   . Fibromyalgia 10/25/2012  . Gastritis 06/28/2012   Mild  . Obesity   . Pelvic pain   . Stress headaches   . Vitamin D deficiency     Past Surgical History:  Procedure Laterality Date  . APPENDECTOMY    . FOOT SURGERY Bilateral    Plantar fasciitis  . THYROIDECTOMY, PARTIAL     pt denies    There were no vitals filed for this visit.      Subjective Assessment - 11/09/16 1204    Subjective No improvemetn and today she rpeorts LT shoulder and knee pain not wanting to move Lt arm   Currently in Pain? Yes   Pain Score 8                          [facility] Adult PT Treatment/Exercise - 11/09/16 0001      Exercises   Exercises Lumbar     Knee/Hip Exercises: Aerobic   Nustep L4 3 extremities minus LT arm due to pain     Moist Heat Therapy   Number Minutes Moist Heat 15 Minutes   Moist Heat Location Lumbar Spine;Cervical     Manual Therapy   Manual Therapy Soft tissue mobilization;Passive ROM   Soft tissue mobilization to neck and back with tool and without.    Passive ROM Lt shoulder , cervical spine.                    PT Short Term Goals - 10/27/16 1227      PT SHORT TERM GOAL #1   Title She will be independent with intial HEP   Baseline no  program   Time 4   Period Weeks   Status New     PT SHORT TERM GOAL #2   Title she will report neck pain improved 30% and increased active rotation to 45 degrees   Baseline 5/10 at eval and 15 degrees rotation   Time 4   Period Weeks   Status New     PT SHORT TERM GOAL #3   Title she will report LBP decreased 30% and incr flexion to 45 degrees    Baseline 7/10 pain and 30 degrees at eval   Time 4   Period Weeks   Status New           PT Long Term Goals - 10/27/16 1229      PT LONG TERM GOAL #1   Title independent with all HEP issued   Baseline independent with initial HEP   Time 10   Period Weeks   Status New     PT LONG TERM GOAL #2   Title She will  report neck and back pain as intermittant   Baseline constant pain   Time 10   Period Weeks   Status New     PT LONG TERM GOAL #3   Title She will be able to lift and carry normal packages without assist due to decr pain   Baseline Gets people to carry items for her due to pain   Time 10   Period Weeks   Status New     PT LONG TERM GOAL #4   Title Her neck motion will be WNL with 1/10 max pain   Baseline cervical rotation 10-12 degree bilateral   Time 10   Period Weeks   Status New     PT LONG TERM GOAL #5   Title She will have no posterior leg motion with movement of lower back   Baseline posterior leg pain with trunk extension and sidebend   Time 10   Period Weeks   Status New     Additional Long Term Goals   Additional Long Term Goals Yes     PT LONG TERM GOAL #6   Title She will return to normal home tasks such as vacuuming with 1-2 max pain   Baseline not doing heavier home tasks/ getting help   Time 10   Period Weeks   Status New               Plan - 11/09/16 1203    Clinical Impression Statement Now with reports of increased LT knee and shoulder pain. She reisists all movement that i painful . Cerivcal motions to RT WNL LT limited due to pain . She reports with STW to LT neck she  bbegan to ahve a LT sided head ache to LT eye.   She felt the back SW was easing pain the neck work tender nad painful.   PT Treatment/Interventions Patient/family education;Passive range of motion;Manual techniques;Therapeutic exercise;Therapeutic activities;Electrical Stimulation;Moist Heat;Dry needling   PT Next Visit Plan Manual stretching and STW and modalities Limit so able to issue HEP, start HEP for ROM neck and back   Consulted and Agree with Plan of Care Patient      Patient will benefit from skilled therapeutic intervention in order to improve the following deficits and impairments:  Postural dysfunction, Decreased strength, Decreased activity tolerance, Pain, Increased muscle spasms, Decreased range of motion  Visit Diagnosis: Acute bilateral low back pain with bilateral sciatica  Cervicalgia  Abnormal posture  Other muscle spasm  Acute pain of left shoulder     Problem List Patient Active Problem List   Diagnosis Date Noted  . Fibromyalgia 10/25/2012  . LLQ abdominal pain 09/08/2012  . Pelvic pain in female 10/02/2011  . Oligomenorrhea 05/20/2011  . VITAMIN D DEFICIENCY 02/15/2009  . OBESITY 06/07/2008  . ANEMIA 04/19/2008  . 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 FATIGUE 04/18/2008  . SKIN RASH 09/21/2007  . BREAST PAIN, BILATERAL 08/22/2007  . CALCANEAL SPUR, RIGHT 08/22/2007  . HEADACHE 08/22/2007  . CALLUSES, FEET, BILATERAL 08/08/2007  . FOOT PAIN, BILATERAL 08/08/2007  . CANDIDIASIS 07/18/2007  . LUMBAGO 07/18/2007  . HELICOBACTER PYLORI GASTRITIS, HX OF 05/30/2007  . POSITIVE PPD 05/18/2007    Darrel Hoover  PT 11/09/2016, 12:55 PM  [facility] [address] [place], [place], [address] Phone: [phone]   Fax:  [phone]  Name: Kristin Joseph MRN: [id number] Date of Birth: 1970/10/06

## 2016-11-12 ENCOUNTER — Ambulatory Visit: Payer: Medicaid Other

## 2016-11-12 DIAGNOSIS — M5442 Lumbago with sciatica, left side: Secondary | ICD-10-CM | POA: Diagnosis not present

## 2016-11-12 DIAGNOSIS — R293 Abnormal posture: Secondary | ICD-10-CM

## 2016-11-12 DIAGNOSIS — M62838 Other muscle spasm: Secondary | ICD-10-CM

## 2016-11-12 DIAGNOSIS — M5441 Lumbago with sciatica, right side: Secondary | ICD-10-CM

## 2016-11-12 DIAGNOSIS — M542 Cervicalgia: Secondary | ICD-10-CM

## 2016-11-12 DIAGNOSIS — M25512 Pain in left shoulder: Secondary | ICD-10-CM

## 2016-11-12 NOTE — Therapy (Signed)
[place], [place], [address] Phone: [phone]   Fax:  [phone]  Physical Therapy Treatment  Patient Details  Name: Kristin Joseph MRN: [id number] Date of Birth: 1970/12/16 Referring Provider: Kevan Ny, MD  Encounter Date: 11/12/2016      PT End of Session - 11/12/16 1210    Visit Number 3   Number of Visits 4   Date for PT Re-Evaluation 11/20/16   Authorization Type Medicaid   Authorization Time Period 11/02/16, 02/02/16   Authorization - Visit Number 3   Authorization - Number of Visits 4   PT Start Time 3664   PT Stop Time 1237   PT Time Calculation (min) 44 min   Activity Tolerance Patient limited by pain   Behavior During Therapy [facility] for tasks assessed/performed;Anxious      Past Medical History:  Diagnosis Date  . Back pain   . Depression   . Fibromyalgia 10/25/2012  . Gastritis 06/28/2012   Mild  . Obesity   . Pelvic pain   . Stress headaches   . Vitamin D deficiency     Past Surgical History:  Procedure Laterality Date  . APPENDECTOMY    . FOOT SURGERY Bilateral    Plantar fasciitis  . THYROIDECTOMY, PARTIAL     pt denies    There were no vitals filed for this visit.     HEP was established. See PT instruction for exercises.[facility] Adult PT Treatment/Exercise - 11/12/16 0001      Moist Heat Therapy   Number Minutes Moist Heat 12 Minutes   Moist Heat Location Lumbar Spine;Cervical;Shoulder                PT Education - 11/12/16 1242    Education provided Yes   Education Details HEP   Person(s) Educated Patient   Methods Explanation;Demonstration;Tactile cues;Verbal cues   Comprehension Returned demonstration;Verbalized understanding          PT Short Term Goals - 10/27/16 1227      PT SHORT TERM GOAL #1   Title She will be independent with intial HEP   Baseline no program   Time 4   Period Weeks   Status  New     PT SHORT TERM GOAL #2   Title she will report neck pain improved 30% and increased active rotation to 45 degrees   Baseline 5/10 at eval and 15 degrees rotation   Time 4   Period Weeks   Status New     PT SHORT TERM GOAL #3   Title she will report LBP decreased 30% and incr flexion to 45 degrees    Baseline 7/10 pain and 30 degrees at eval   Time 4   Period Weeks   Status New           PT Long Term Goals - 10/27/16 1229      PT LONG TERM GOAL #1   Title independent with all HEP issued   Baseline independent with initial HEP   Time 10   Period Weeks   Status New     PT LONG TERM GOAL #2   Title She will report neck and back pain as intermittant   Baseline constant pain   Time 10   Period Weeks   Status New     PT LONG TERM GOAL #3   Title She  will be able to lift and carry normal packages without assist due to decr pain   Baseline Gets people to carry items for her due to pain   Time 10   Period Weeks   Status New     PT LONG TERM GOAL #4   Title Her neck motion will be WNL with 1/10 max pain   Baseline cervical rotation 10-12 degree bilateral   Time 10   Period Weeks   Status New     PT LONG TERM GOAL #5   Title She will have no posterior leg motion with movement of lower back   Baseline posterior leg pain with trunk extension and sidebend   Time 10   Period Weeks   Status New     Additional Long Term Goals   Additional Long Term Goals Yes     PT LONG TERM GOAL #6   Title She will return to normal home tasks such as vacuuming with 1-2 max pain   Baseline not doing heavier home tasks/ getting help   Time 10   Period Weeks   Status New               Plan - 11/12/16 1232    Clinical Impression Statement No changes per patient. Initiated HEP . She was very limited in ROM  doing exercses reporting pain as limiting factor.   This is in contraxt to observing movements of Lt arm and neck to be fuid in less than 3/4 ROm  and able to carry  nad lift her bag  without display of pain. .. When supine on heat she used LT arm without complaint to support lower back . She had her disabled daughter with her and treatment was limited due to this.    PT Treatment/Interventions Patient/family education;Passive range of motion;Manual techniques;Therapeutic exercise;Therapeutic activities;Electrical Stimulation;Moist Heat;Dry needling   PT Next Visit Plan Manual stretching and STW and modalities Limit so able to issue HEP, start HEP for ROM neck and back   PT Home Exercise Plan LAQ, Sling exer for shoulder, LTR, PPT, single knee to chest,  cervical ROM all planes   Consulted and Agree with Plan of Care Patient      Patient will benefit from skilled therapeutic intervention in order to improve the following deficits and impairments:  Postural dysfunction, Decreased strength, Decreased activity tolerance, Pain, Increased muscle spasms, Decreased range of motion  Visit Diagnosis: Acute bilateral low back pain with bilateral sciatica  Cervicalgia  Abnormal posture  Other muscle spasm  Acute pain of left shoulder     Problem List Patient Active Problem List   Diagnosis Date Noted  . Fibromyalgia 10/25/2012  . LLQ abdominal pain 09/08/2012  . Pelvic pain in female 10/02/2011  . Oligomenorrhea 05/20/2011  . VITAMIN D DEFICIENCY 02/15/2009  . OBESITY 06/07/2008  . ANEMIA 04/19/2008  . 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 FATIGUE 04/18/2008  . SKIN RASH 09/21/2007  . BREAST PAIN, BILATERAL 08/22/2007  . CALCANEAL SPUR, RIGHT 08/22/2007  . HEADACHE 08/22/2007  . CALLUSES, FEET, BILATERAL 08/08/2007  . FOOT PAIN, BILATERAL 08/08/2007  . CANDIDIASIS 07/18/2007  . LUMBAGO 07/18/2007  . HELICOBACTER PYLORI GASTRITIS, HX OF 05/30/2007  . POSITIVE PPD 05/18/2007    Darrel Hoover  PT 11/12/2016, 12:46 PM  [facility] [address] [place], [place], [address] Phone: [phone]   Fax:   [phone]  Name: Kristin Joseph MRN: [id number] Date of Birth: [birth year]

## 2016-11-12 NOTE — Patient Instructions (Signed)
From cabinet issued sling exer for shoulder 10 reps , PPT, x10, LTR x3-5, , single Knee to chest  2-3 30 sec, LAQ x10, , cervical all planes 23- reps   All 2x/day

## 2016-11-17 ENCOUNTER — Ambulatory Visit: Payer: Medicaid Other

## 2016-11-17 DIAGNOSIS — M5442 Lumbago with sciatica, left side: Secondary | ICD-10-CM | POA: Diagnosis not present

## 2016-11-17 DIAGNOSIS — M5441 Lumbago with sciatica, right side: Secondary | ICD-10-CM

## 2016-11-17 DIAGNOSIS — M542 Cervicalgia: Secondary | ICD-10-CM

## 2016-11-17 DIAGNOSIS — R293 Abnormal posture: Secondary | ICD-10-CM

## 2016-11-17 DIAGNOSIS — M62838 Other muscle spasm: Secondary | ICD-10-CM

## 2016-11-17 DIAGNOSIS — M25512 Pain in left shoulder: Secondary | ICD-10-CM

## 2016-11-17 NOTE — Therapy (Signed)
[place], [place], [address] Phone: [phone]   Fax:  [phone]  Physical Therapy Treatment  Patient Details  Name: Kristin Joseph MRN: [id number] Date of Birth: 1970-10-03 Referring Provider: Kevan Ny, MD  Encounter Date: 11/17/2016      PT End of Session - 11/17/16 1103    Visit Number 4   Number of Visits 4   Date for PT Re-Evaluation 01/15/17   Authorization Type Medicaid   Authorization Time Period 11/02/16, 12/02/16   Authorization - Visit Number 4   Authorization - Number of Visits 4   PT Start Time 1024   PT Stop Time 1118   PT Time Calculation (min) 54 min   Activity Tolerance Patient tolerated treatment well;Patient limited by pain   Behavior During Therapy [facility] for tasks assessed/performed      Past Medical History:  Diagnosis Date  . Back pain   . Depression   . Fibromyalgia 10/25/2012  . Gastritis 06/28/2012   Mild  . Obesity   . Pelvic pain   . Stress headaches   . Vitamin D deficiency     Past Surgical History:  Procedure Laterality Date  . APPENDECTOMY    . FOOT SURGERY Bilateral    Plantar fasciitis  . THYROIDECTOMY, PARTIAL     pt denies    There were no vitals filed for this visit.      Subjective Assessment - 11/17/16 1026    Subjective Knee some better , neck and back not better She reports doing HEP 2x/day . She needed cues to do her HEP here.  Poor abdominal control.  she reports doing HEP 2x/day   Currently in Pain? Yes   Pain Score 8    Pain Location Neck   Pain Orientation Posterior;Left   Pain Descriptors / Indicators Tightness   Pain Type Acute pain   Pain Onset 1 to 4 weeks ago   Pain Frequency Constant   Pain Relieving Factors heat meds   Pain Score 8   Pain Location Back   Pain Orientation Lower;Posterior;Mid   Pain Descriptors / Indicators Aching   Pain Type Acute pain   Pain Onset 1 to 4 weeks ago   Pain Frequency Constant   Aggravating Factors  sitting   Pain Relieving Factors heat , meds                         [facility] Adult PT Treatment/Exercise - 11/17/16 0001      Lumbar Exercises: Stretches   Single Knee to Chest Stretch 5 reps;10 seconds   Lower Trunk Rotation 5 reps   Lower Trunk Rotation Limitations RT/LT     Lumbar Exercises: Supine   Ab Set 10 reps   Glut Set 10 reps   Clam 10 reps   Clam Limitations RT/LT with ab set  much cuing needed   Bridge 20 reps   Other Supine Lumbar Exercises LT shoulder AROM IR / ER x 20     Moist Heat Therapy   Number Minutes Moist Heat 15 Minutes   Moist Heat Location Lumbar Spine;Cervical;Shoulder     Manual Therapy   Soft tissue mobilization to neck  with manual traction   Passive ROM cervical rotation RT and LT and sidebending x 3 she resisted ROM                    PT Short Term Goals - 11/17/16 1131  PT SHORT TERM GOAL #1   Title She will be independent with intial HEP   Baseline still needs cues   Time 4   Status On-going     PT SHORT TERM GOAL #2   Title she will report neck pain improved 30% and increased active rotation to 45 degrees   Baseline passive rotation > 45 degrees active not > 45 degrees as she guards.  She states pain is better 5-7/10 generally   Time 8   Period Weeks   Status On-going     PT SHORT TERM GOAL #3   Title she will report LBP decreased 30% and incr flexion to 45 degrees    Baseline She report pain 5-7 /10 generalluy but 8-9 at nite making sleep difficult  flex to 45 degrees   Time 8   Period Weeks   Status On-going           PT Long Term Goals - 11/17/16 1133      PT LONG TERM GOAL #1   Title independent with all HEP issued   Baseline independent with initial HEP   Time 10   Status On-going     PT LONG TERM GOAL #2   Title She will report neck and back pain as intermittant   Baseline constant pain   Time 10   Period Weeks   Status On-going     PT LONG TERM GOAL #3    Title She will be able to lift and carry normal packages without assist due to decr pain   Baseline Gets people to carry items for her due to pain though she is able to carry her bag she brings to clinic   Time 10   Period Weeks   Status On-going     PT LONG TERM GOAL #4   Title Her neck motion will be WNL with 1/10 max pain   Baseline passive > 45 degrees active , 45 degreees rotation  and 15 degree ssidebend   Time 8   Period Weeks   Status On-going     PT LONG TERM GOAL #5   Title She will have no posterior leg pain with movement of lower back   Baseline posterior leg pain with trunk extension and sidebend   Time 10   Status On-going     PT LONG TERM GOAL #6   Title She will return to normal home tasks such as vacuuming with 1-2 max pain   Baseline not doing heavier home tasks/ getting help   Time 10   Status On-going               Plan - 11/17/16 1114    Clinical Impression Statement She reports improvement with pain now 5-7/10 generally in neck and back with neck generally more painful.   When observed her movement patterns are smooth and normal but when tested she is very guarded.    PT Frequency 2x / week   PT Duration 6 weeks   PT Treatment/Interventions Patient/family education;Passive range of motion;Manual techniques;Therapeutic exercise;Therapeutic activities;Electrical Stimulation;Moist Heat;Dry needling   PT Next Visit Plan Manual stretching and STW and modalities progress  HEP, start HEP for ROM neck and back   PT Home Exercise Plan LAQ, Sling exer for shoulder, LTR, PPT, single knee to chest,  cervical ROM all planes   Consulted and Agree with Plan of Care Patient      Patient will benefit from skilled therapeutic intervention in order to improve the  following deficits and impairments:  Postural dysfunction, Decreased strength, Decreased activity tolerance, Pain, Increased muscle spasms, Decreased range of motion  Visit Diagnosis: Acute bilateral low  back pain with bilateral sciatica  Cervicalgia  Abnormal posture  Other muscle spasm  Acute pain of left shoulder     Problem List Patient Active Problem List   Diagnosis Date Noted  . Fibromyalgia 10/25/2012  . LLQ abdominal pain 09/08/2012  . Pelvic pain in female 10/02/2011  . Oligomenorrhea 05/20/2011  . VITAMIN D DEFICIENCY 02/15/2009  . OBESITY 06/07/2008  . ANEMIA 04/19/2008  . 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 FATIGUE 04/18/2008  . SKIN RASH 09/21/2007  . BREAST PAIN, BILATERAL 08/22/2007  . CALCANEAL SPUR, RIGHT 08/22/2007  . HEADACHE 08/22/2007  . CALLUSES, FEET, BILATERAL 08/08/2007  . FOOT PAIN, BILATERAL 08/08/2007  . CANDIDIASIS 07/18/2007  . LUMBAGO 07/18/2007  . HELICOBACTER PYLORI GASTRITIS, HX OF 05/30/2007  . POSITIVE PPD 05/18/2007    Darrel Hoover  PT 11/17/2016, 11:51 AM  [facility] [address] [place], [place], [address] Phone: [phone]   Fax:  [phone]  Name: Kristin Joseph MRN: [id number] Date of Birth: 1970-10-12

## 2016-11-19 ENCOUNTER — Encounter: Payer: Medicaid Other | Admitting: Physical Therapy

## 2016-11-26 ENCOUNTER — Encounter (HOSPITAL_COMMUNITY): Payer: Self-pay | Admitting: Emergency Medicine

## 2016-11-26 ENCOUNTER — Ambulatory Visit (HOSPITAL_COMMUNITY)
Admission: EM | Admit: 2016-11-26 | Discharge: 2016-11-26 | Disposition: A | Discharge status: Home / Self Care | Payer: Medicaid Other | Attending: Family Medicine | Admitting: Family Medicine

## 2016-11-26 DIAGNOSIS — R1032 Left lower quadrant pain: Secondary | ICD-10-CM | POA: Diagnosis not present

## 2016-11-26 LAB — POCT URINALYSIS DIP (DEVICE)
BILIRUBIN URINE: NEGATIVE
GLUCOSE, UA: NEGATIVE mg/dL
HGB URINE DIPSTICK: NEGATIVE
KETONES UR: NEGATIVE mg/dL
LEUKOCYTES UA: NEGATIVE
Nitrite: NEGATIVE
Protein, ur: NEGATIVE mg/dL
SPECIFIC GRAVITY, URINE: 1.01 (ref 1.005–1.030)
Urobilinogen, UA: 0.2 mg/dL (ref 0.0–1.0)
pH: 7 (ref 5.0–8.0)

## 2016-11-26 LAB — POCT PREGNANCY, URINE: Preg Test, Ur: NEGATIVE

## 2016-11-26 NOTE — ED Provider Notes (Signed)
Kristin Joseph    CSN: [id number] Arrival date & time: 11/26/16  1603     History   Chief Complaint Chief Complaint  Patient presents with  . Abdominal Pain    HPI Kristin Joseph is a 46 y.o. female.   Kristin Joseph presents with complaints of LLQ pain which has been chronic in nature for her but worse over the past two weeks. She has had extensive work up of this in the past with OBGYN as well as with GI. No specific diagnosis had been made. She states the pain is sharp, worse at night and if she lays on her left side. No specific known fevers. She feels she has a hard time emptying her bladder, this has been ongoing. Without burning, frequency or blood to urination. Denies nausea vomiting or diarrhea. Last BM yesterday. States she tends to have a BM daily. Pain is worse with deep breathing. It is sharp. Worse with eating. LMP was 3 months ago, she does not know if she is experiencing menopause. Rates pain 8/10. Denies vaginal discharge itching or pain. Has not taken any medications for her pain.     ROS per HPI.       Past Medical History:  Diagnosis Date  . Back pain   . Depression   . Fibromyalgia 10/25/2012  . Gastritis 06/28/2012   Mild  . Obesity   . Pelvic pain   . Stress headaches   . Vitamin D deficiency     Patient Active Problem List   Diagnosis Date Noted  . Fibromyalgia 10/25/2012  . LLQ abdominal pain 09/08/2012  . Pelvic pain in female 10/02/2011  . Oligomenorrhea 05/20/2011  . VITAMIN D DEFICIENCY 02/15/2009  . OBESITY 06/07/2008  . ANEMIA 04/19/2008  . 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 FATIGUE 04/18/2008  . SKIN RASH 09/21/2007  . BREAST PAIN, BILATERAL 08/22/2007  . CALCANEAL SPUR, RIGHT 08/22/2007  . HEADACHE 08/22/2007  . CALLUSES, FEET, BILATERAL 08/08/2007  . FOOT PAIN, BILATERAL 08/08/2007  . CANDIDIASIS 07/18/2007  . LUMBAGO 07/18/2007  . HELICOBACTER PYLORI GASTRITIS, HX OF 05/30/2007  . POSITIVE PPD 05/18/2007    Past Surgical  History:  Procedure Laterality Date  . APPENDECTOMY    . FOOT SURGERY Bilateral    Plantar fasciitis  . THYROIDECTOMY, PARTIAL     pt denies    OB History    Gravida Para Term Preterm AB Living   4 3 3   1 3    SAB TAB Ectopic Multiple Live Births   1               Home Medications    Prior to Admission medications   Medication Sig Start Date End Date Taking? Authorizing Provider  methocarbamol (ROBAXIN) 500 MG tablet Take 1 tablet (500 mg total) by mouth 4 (four) times daily. 10/14/16   Carlisle Cater, PA-C  naproxen (NAPROSYN) 500 MG tablet Take 1 tablet (500 mg total) by mouth 2 (two) times daily. 10/14/16   Carlisle Cater, PA-C    Family History Family History  Problem Relation Age of Onset  . Depression Mother   . Colon cancer Neg Hx     Social History Social History   Tobacco Use  . Smoking status: Never Smoker  . Smokeless tobacco: Never Used  Substance Use Topics  . Alcohol use: No  . Drug use: No     Allergies   Patient has no known allergies.   Review of Systems Review of Systems   Physical  Exam Triage Vital Signs ED Triage Vitals [11/26/16 1628]  Enc Vitals Group     BP      Pulse      Resp      Temp      Temp src      SpO2      Weight      Height      Head Circumference      Peak Flow      Pain Score 10     Pain Loc      Pain Edu?      Excl. in [place]?    No data found.  Updated Vital Signs There were no vitals taken for this visit.  Visual Acuity Right Eye Distance:   Left Eye Distance:   Bilateral Distance:    Right Eye Near:   Left Eye Near:    Bilateral Near:     Physical Exam  Constitutional: She is oriented to person, place, and time. She appears well-developed and well-nourished. No distress.  Cardiovascular: Normal rate, regular rhythm and normal heart sounds.  Pulmonary/Chest: Effort normal and breath sounds normal.  Abdominal: There is tenderness in the left lower quadrant. There is no rebound, no guarding, no  CVA tenderness and negative Murphy's sign.  Neurological: She is alert and oriented to person, place, and time.  Skin: Skin is warm and dry.  Vitals reviewed.    UC Treatments / Results  Labs (all labs ordered are listed, but only abnormal results are displayed) Labs Reviewed  POCT URINALYSIS DIP (DEVICE)  POCT PREGNANCY, URINE    EKG  EKG Interpretation None       Radiology No results found.  Procedures Procedures (including critical care time)  Medications Ordered in UC Medications - No data to display   Initial Impression / Assessment and Plan / UC Course  I have reviewed the triage vital signs and the nursing notes.  Pertinent labs & imaging results that were available during my care of the patient were reviewed by me and considered in my medical decision making (see chart for details).     LLQ pain which has been ongoing for the 3 years despite extensive work up. Urine is negative tonight. HR and temp are WNL tonight. Non specific abdominal findings on exam, non toxic non distressed in appearance. Ovarian cyst vs constipation vs muscular pain as differentials at this time. Recommended trial of miralax. Tylenol as needed. If symptoms worsen or do not improve in the next week to return to be seen or to follow up with PCP. Patient verbalized understanding and agreeable to plan. Ambulatory out of clinic without difficulty.    Final Clinical Impressions(s) / UC Diagnoses   Final diagnoses:  Left lower quadrant pain    ED Discharge Orders    None       Controlled Substance Prescriptions [address] Controlled Substance Registry consulted? Not Applicable   Zigmund Gottron, NP 11/26/16 1742

## 2016-11-26 NOTE — Discharge Instructions (Signed)
Your urine sample today

## 2016-11-26 NOTE — ED Triage Notes (Signed)
Left abdominal pain for 6 months, chest soreness for 2 weeks.  Patient has seen a doctor for this, but no diagnosis.  Patient having difficulty sleeping.  No nausea or vomiting.  Pain is worse with deep inspiration.  Left side pain makes patient catch her breath, gasp with sharp pain.  Eating makes left abdominal pain worse.  Last bowel movement was yesterday per patient

## 2016-11-26 NOTE — ED Notes (Signed)
Left treatment room when she answered the phone.

## 2016-12-03 ENCOUNTER — Encounter: Payer: Self-pay | Admitting: Physical Therapy

## 2016-12-03 ENCOUNTER — Ambulatory Visit: Payer: Medicaid Other | Attending: Internal Medicine | Admitting: Physical Therapy

## 2016-12-03 ENCOUNTER — Ambulatory Visit: Payer: Medicaid Other | Admitting: Physical Therapy

## 2016-12-03 DIAGNOSIS — R293 Abnormal posture: Secondary | ICD-10-CM | POA: Diagnosis present

## 2016-12-03 DIAGNOSIS — M5441 Lumbago with sciatica, right side: Secondary | ICD-10-CM | POA: Diagnosis present

## 2016-12-03 DIAGNOSIS — M25512 Pain in left shoulder: Secondary | ICD-10-CM | POA: Diagnosis present

## 2016-12-03 DIAGNOSIS — M62838 Other muscle spasm: Secondary | ICD-10-CM | POA: Diagnosis present

## 2016-12-03 DIAGNOSIS — M542 Cervicalgia: Secondary | ICD-10-CM

## 2016-12-03 DIAGNOSIS — M5442 Lumbago with sciatica, left side: Secondary | ICD-10-CM | POA: Insufficient documentation

## 2016-12-03 NOTE — Patient Instructions (Addendum)
You may use the palm of your hand instead of finger  Isometric Rotation   Put left index finger on left temple. Gently try to turn head to right, pushing against finger. Hold _5___ seconds. Repeat on the left. Push and release slowly. Repeat __5__ times. Do __2-3__ sessions per day.  http://gt2.exer.us/24   Copyright  VHI. All rights reserved.  Isometric Lateral Flexion   Put right index finger on right temple. Gently try to move right ear toward shoulder, pushing against finger. Hold __5__ seconds. Repeat on other side. Push and release slowly. Repeat _5___ times. Do _2-3___ sessions per day.  http://gt2.exer.us/22   Copyright  VHI. All rights reserved.  Isometric Flexion   Put the tips of both index fingers lightly on forehead. Gently press into fingers as if looking toward ground. Resist for _5___ seconds. Press and release slowly. Repeat _5___ times. Do _2-3___ sessions per day.  http://gt2.exer.us/18   Copyright  VHI. All rights reserved.  Isometric Extension   Put index fingers gently on back of head. Slowly try to look toward ceiling. Push head into fingers for _5___ seconds. Push and release slowly. Repeat __5__ times. Do __2-3__ sessions per day.  http://gt2.exer.us/20   Copyright  VHI. All rights reserved.    Voncille Lo, PT Certified Exercise Expert for the Aging Adult  12/03/16 3:42 PM Phone: [phone] Fax: [phone]

## 2016-12-03 NOTE — Therapy (Addendum)
[place], [place], [address] Phone: [phone]   Fax:  [phone]  Physical Therapy Treatment  Patient Details  Name: Kristin Joseph MRN: [id number] Date of Birth: Dec 24, 1970 Referring Provider: Kevan Ny, MD   Encounter Date: 12/03/2016  PT End of Session - 12/03/16 1511    Visit Number  5    Number of Visits  12    Date for PT Re-Evaluation  01/15/17    Authorization Type  Medicaid    Authorization Time Period  11/02/16, 12/02/16    Authorization - Visit Number  5    Authorization - Number of Visits  12    PT Start Time  0320 arrived late / went to bathroom    PT Stop Time  0420    PT Time Calculation (min)  60 min    Activity Tolerance  Patient tolerated treatment well;Patient limited by pain    Behavior During Therapy  [facility] for tasks assessed/performed       Past Medical History:  Diagnosis Date  . Back pain   . Depression   . Fibromyalgia 10/25/2012  . Gastritis 06/28/2012   Mild  . Obesity   . Pelvic pain   . Stress headaches   . Vitamin D deficiency     Past Surgical History:  Procedure Laterality Date  . APPENDECTOMY    . FOOT SURGERY Bilateral    Plantar fasciitis  . THYROIDECTOMY, PARTIAL     pt denies    There were no vitals filed for this visit.  Subjective Assessment - 12/03/16 1514    Subjective  Pain in neck, left shoulder,  Pt states she does exercises at home    Currently in Pain?  Yes    Pain Score  8     Pain Location  Neck    Pain Orientation  Left;Posterior    Pain Descriptors / Indicators  Tightness;Spasm;Throbbing    Pain Type  Acute pain    Pain Onset  1 to 4 weeks ago    Pain Frequency  Constant    Pain Score  8    Pain Location  Back    Pain Orientation  Lower;Posterior;Mid    Pain Descriptors / Indicators  Aching    Pain Type  Acute pain    Pain Onset  1 to 4 weeks ago    Pain Frequency  Constant         [facility] PT Assessment - 12/03/16 1556       AROM   AROM Assessment Site  Cervical    Cervical Flexion  50    Cervical Extension  20    Cervical - Right Side Bend  40    Cervical - Left Side Bend  42    Cervical - Right Rotation  45    Cervical - Left Rotation  35                  [facility] Adult PT Treatment/Exercise - 12/03/16 1520      Self-Care   Self-Care  Other Self-Care Comments    Posture  sitting and standing posture without flexing posture and sacral stiing    Other Self-Care Comments   positioning for sleep and caring for neck during day       Neck Exercises: Seated   Cervical Isometrics  Flexion;Extension;Right lateral flexion;Left lateral flexion;Right rotation;Left rotation;5 secs 5 x each with palm of hand      Lumbar Exercises: Stretches  Single Knee to Chest Stretch  5 reps;10 seconds    Lower Trunk Rotation  5 reps    Lower Trunk Rotation Limitations  right and left       Moist Heat Therapy   Number Minutes Moist Heat  15 Minutes    Moist Heat Location  Cervical      Ultrasound   Ultrasound Location  left neck    Ultrasound Parameters  1.5 w/cm2    Ultrasound Goals  Pain      Manual Therapy   Manual Therapy  Soft tissue mobilization;Passive ROM;Joint mobilization    Joint Mobilization  lateral UPA on left C-3 to C-6 grade 2     Soft tissue mobilization  left paracervical , Upper trap and levator    Passive ROM  cervical rotation      Neck Exercises: Stretches   Upper Trapezius Stretch  3 reps;30 seconds left side only    Levator Stretch  3 reps;30 seconds left side only    Other Neck Stretches  neck retraction in supine with towel roll               PT Education - 12/03/16 1614    Education provided  Yes    Education Details  Given HEP for neck isometrics and neck stretches and pain management ideas    Person(s) Educated  Patient    Methods  Explanation;Demonstration;Tactile cues;Verbal cues;Handout    Comprehension  Verbalized understanding;Returned demonstration        PT Short Term Goals - 12/03/16 1621      PT SHORT TERM GOAL #1   Title  She will be independent with intial HEP    Baseline  added to cervical HEP    Time  4    Period  Weeks    Status  On-going      PT SHORT TERM GOAL #2   Title  she will report neck pain improved 30% and increased active rotation to 45 degrees    Baseline  AROM right 45  left 35    Time  8    Period  Weeks    Status  On-going      PT SHORT TERM GOAL #3   Title  she will report LBP decreased 30% and incr flexion to 45 degrees     Baseline  reports pain at 8/10 in back and neck today    Time  8    Period  Weeks    Status  On-going        PT Long Term Goals - 11/17/16 1133      PT LONG TERM GOAL #1   Title  independent with all HEP issued    Baseline  independent with initial HEP    Time  10    Status  On-going      PT LONG TERM GOAL #2   Title  She will report neck and back pain as intermittant    Baseline  constant pain    Time  10    Period  Weeks    Status  On-going      PT LONG TERM GOAL #3   Title  She will be able to lift and carry normal packages without assist due to decr pain    Baseline  Gets people to carry items for her due to pain though she is able to carry her bag she brings to clinic    Time  10    Period  Weeks  Status  On-going      PT LONG TERM GOAL #4   Title  Her neck motion will be WNL with 1/10 max pain    Baseline  passive > 45 degrees active , 45 degreees rotation  and 15 degree ssidebend    Time  8    Period  Weeks    Status  On-going      PT LONG TERM GOAL #5   Title  She will have no posterior leg pain with movement of lower back    Baseline  posterior leg pain with trunk extension and sidebend    Time  10    Status  On-going      PT LONG TERM GOAL #6   Title  She will return to normal home tasks such as vacuuming with 1-2 max pain    Baseline  not doing heavier home tasks/ getting help    Time  10    Status  On-going           Plan - 12/03/16  1624    Clinical Impression Statement  Pt reports pain at 8/10 in cervical and back.  Pt tends to walk with antalgic gait but when observed taking off turtle neck  and reaching for purse, she exhibits more normalized movements patterns.   Pt reported feeling best with passive [place]  RX but she was educated in importance of movement and exercises to achieve goals and return to  normal life as an active mother.  AROM in increased in all planes  See Flowsheet for specifics.      Rehab Potential  Fair    PT Frequency  2x / week    PT Duration  6 weeks    PT Treatment/Interventions  Patient/family education;Passive range of motion;Manual techniques;Therapeutic exercise;Therapeutic activities;Electrical Stimulation;Moist Heat;Dry needling    PT Next Visit Plan  Manual stretching and STW and modalities progress Cervical HEP done and can progress.  Needs HEP for  back basic movement    PT Home Exercise Plan  LAQ, Sling exer for shoulder, LTR, PPT, single knee to chest,  cervical ROM all planes, cervical isometrics and neck stretches levator and upper trap    Consulted and Agree with Plan of Care  Patient            Patient will benefit from skilled therapeutic intervention in order to improve the following deficits and impairments:  Postural dysfunction, Decreased strength, Decreased activity tolerance, Pain, Increased muscle spasms, Decreased range of motion  Visit Diagnosis: Acute bilateral low back pain with bilateral sciatica  Cervicalgia  Abnormal posture  Other muscle spasm  Acute pain of left shoulder     Problem List Patient Active Problem List   Diagnosis Date Noted  . Fibromyalgia 10/25/2012  . LLQ abdominal pain 09/08/2012  . Pelvic pain in female 10/02/2011  . Oligomenorrhea 05/20/2011  . VITAMIN D DEFICIENCY 02/15/2009  . OBESITY 06/07/2008  . ANEMIA 04/19/2008  . 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 FATIGUE 04/18/2008  . SKIN RASH 09/21/2007  . BREAST PAIN, BILATERAL 08/22/2007   . CALCANEAL SPUR, RIGHT 08/22/2007  . HEADACHE 08/22/2007  . CALLUSES, FEET, BILATERAL 08/08/2007  . FOOT PAIN, BILATERAL 08/08/2007  . CANDIDIASIS 07/18/2007  . LUMBAGO 07/18/2007  . HELICOBACTER PYLORI GASTRITIS, HX OF 05/30/2007  . POSITIVE PPD 05/18/2007   Voncille Lo, PT Certified Exercise Expert for the Aging Adult  12/03/16 4:43 PM Phone: [phone] Fax: [phone]  [facility] [address]  [place], [place], [address] Phone: [phone]   Fax:  [phone]  Name: Gurleen Larrivee MRN: [id number] Date of Birth: [birth year]

## 2016-12-04 ENCOUNTER — Ambulatory Visit: Payer: Medicaid Other | Admitting: Physical Therapy

## 2016-12-07 ENCOUNTER — Telehealth: Payer: Self-pay | Admitting: Physical Therapy

## 2016-12-07 ENCOUNTER — Ambulatory Visit: Payer: Medicaid Other | Admitting: Physical Therapy

## 2016-12-07 NOTE — Telephone Encounter (Signed)
Left message regarding no-show to last two appointments. Left request on voice-mail for patient to call back and conform her appointment for tomorrow/cancel or I will cancel her future appointments per our attendance policy.

## 2016-12-08 ENCOUNTER — Ambulatory Visit: Payer: Medicaid Other | Admitting: Physical Therapy

## 2016-12-08 ENCOUNTER — Telehealth: Payer: Self-pay | Admitting: Physical Therapy

## 2016-12-08 NOTE — Telephone Encounter (Signed)
Pt returned my call and reports she was unaware Medicaid had approved more visits. She was waiting on someone to call her and did not know she had appointments. She cannot come to the appointment today due to child care but does plan to attend next week.

## 2016-12-14 ENCOUNTER — Encounter: Payer: Self-pay | Admitting: Physical Therapy

## 2016-12-14 ENCOUNTER — Ambulatory Visit: Payer: Medicaid Other | Admitting: Physical Therapy

## 2016-12-14 DIAGNOSIS — M5441 Lumbago with sciatica, right side: Secondary | ICD-10-CM

## 2016-12-14 DIAGNOSIS — M5442 Lumbago with sciatica, left side: Secondary | ICD-10-CM | POA: Diagnosis not present

## 2016-12-14 DIAGNOSIS — R293 Abnormal posture: Secondary | ICD-10-CM

## 2016-12-14 DIAGNOSIS — M542 Cervicalgia: Secondary | ICD-10-CM

## 2016-12-14 DIAGNOSIS — M25512 Pain in left shoulder: Secondary | ICD-10-CM

## 2016-12-14 DIAGNOSIS — M62838 Other muscle spasm: Secondary | ICD-10-CM

## 2016-12-14 NOTE — Therapy (Signed)
[place], [place], [address] Phone: [phone]   Fax:  [phone]  Physical Therapy Treatment  Patient Details  Name: Kristin Joseph MRN: [id number] Date of Birth: May 30, 1970 Referring Provider: Kevan Ny, MD   Encounter Date: 12/14/2016  PT End of Session - 12/14/16 6962    Visit Number  6    Number of Visits  12    Date for PT Re-Evaluation  01/15/17    Authorization Type  Medicaid    Authorization Time Period   11/15-12/26/2018    Authorization - Visit Number  2    Authorization - Number of Visits  12    PT Start Time  1028 13 min late    PT Stop Time  1115    PT Time Calculation (min)  47 min       Past Medical History:  Diagnosis Date  . Back pain   . Depression   . Fibromyalgia 10/25/2012  . Gastritis 06/28/2012   Mild  . Obesity   . Pelvic pain   . Stress headaches   . Vitamin D deficiency     Past Surgical History:  Procedure Laterality Date  . APPENDECTOMY    . FOOT SURGERY Bilateral    Plantar fasciitis  . THYROIDECTOMY, PARTIAL     pt denies    There were no vitals filed for this visit.  Subjective Assessment - 12/14/16 1031    Subjective  My shoulder feels better. He heat helps my neck and back.     Currently in Pain?  Yes    Pain Score  6     Pain Location  Neck    Pain Orientation  Left;Posterior    Aggravating Factors   constant    Pain Relieving Factors  heat , meds     Pain Score  8    Pain Location  Back    Pain Orientation  Mid;Lower;Upper    Pain Descriptors / Indicators  Constant;Tightness    Aggravating Factors   constant     Pain Relieving Factors  heat, meds                      [facility] Adult PT Treatment/Exercise - 12/14/16 0001      Neck Exercises: Supine   Neck Retraction  10 reps    Other Supine Exercise  supine yellow and horizontal abduction, ER x 20 each      Lumbar Exercises: Stretches   Single Knee to Chest Stretch  3  reps;30 seconds    Lower Trunk Rotation  5 reps    Lower Trunk Rotation Limitations  right and left     Pelvic Tilt  10 seconds    Piriformis Stretch  2 reps;20 seconds IR/ ER      Lumbar Exercises: Supine   Clam  10 reps bilateral and unilateral.     Bridge  10 reps      Moist Heat Therapy   Number Minutes Moist Heat  15 Minutes    Moist Heat Location  Cervical;Lumbar Spine      Neck Exercises: Stretches   Upper Trapezius Stretch  3 reps;30 seconds left side only    Levator Stretch  3 reps;30 seconds left side only    Other Neck Stretches  neck retraction in supine with towel roll                 PT Short Term Goals - 12/03/16 1621  PT SHORT TERM GOAL #1   Title  She will be independent with intial HEP    Baseline  added to cervical HEP    Time  4    Period  Weeks    Status  On-going      PT SHORT TERM GOAL #2   Title  she will report neck pain improved 30% and increased active rotation to 45 degrees    Baseline  AROM right 45  left 35    Time  8    Period  Weeks    Status  On-going      PT SHORT TERM GOAL #3   Title  she will report LBP decreased 30% and incr flexion to 45 degrees     Baseline  reports pain at 8/10 in back and neck today    Time  8    Period  Weeks    Status  On-going        PT Long Term Goals - 11/17/16 1133      PT LONG TERM GOAL #1   Title  independent with all HEP issued    Baseline  independent with initial HEP    Time  10    Status  On-going      PT LONG TERM GOAL #2   Title  She will report neck and back pain as intermittant    Baseline  constant pain    Time  10    Period  Weeks    Status  On-going      PT LONG TERM GOAL #3   Title  She will be able to lift and carry normal packages without assist due to decr pain    Baseline  Gets people to carry items for her due to pain though she is able to carry her bag she brings to clinic    Time  10    Period  Weeks    Status  On-going      PT LONG TERM GOAL #4    Title  Her neck motion will be WNL with 1/10 max pain    Baseline  passive > 45 degrees active , 45 degreees rotation  and 15 degree ssidebend    Time  8    Period  Weeks    Status  On-going      PT LONG TERM GOAL #5   Title  She will have no posterior leg pain with movement of lower back    Baseline  posterior leg pain with trunk extension and sidebend    Time  10    Status  On-going      PT LONG TERM GOAL #6   Title  She will return to normal home tasks such as vacuuming with 1-2 max pain    Baseline  not doing heavier home tasks/ getting help    Time  10    Status  On-going            Plan - 12/14/16 1104    Clinical Impression Statement  Pt arrives 13 minutes late today and c/o continued high levels of constant neck and back pain. Reviewed cervical and lumbar HEP. Perfomed lumbar and scap stabilization with pt reporting muscles soreness in gluteals. HMP at end of session per pt request.     PT Next Visit Plan  Manual stretching and STW and modalities progress Cervical HEP done and can progress.  Needs HEP for  back basic movement    PT Home  Exercise Plan  LAQ, Sling exer for shoulder, LTR, PPT, single knee to chest,  cervical ROM all planes, cervical isometrics and neck stretches levator and upper trap    Consulted and Agree with Plan of Care  Patient       Patient will benefit from skilled therapeutic intervention in order to improve the following deficits and impairments:  Postural dysfunction, Decreased strength, Decreased activity tolerance, Pain, Increased muscle spasms, Decreased range of motion  Visit Diagnosis: Acute bilateral low back pain with bilateral sciatica  Cervicalgia  Abnormal posture  Other muscle spasm  Acute pain of left shoulder     Problem List Patient Active Problem List   Diagnosis Date Noted  . Fibromyalgia 10/25/2012  . LLQ abdominal pain 09/08/2012  . Pelvic pain in female 10/02/2011  . Oligomenorrhea 05/20/2011  . VITAMIN D  DEFICIENCY 02/15/2009  . OBESITY 06/07/2008  . ANEMIA 04/19/2008  . 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 FATIGUE 04/18/2008  . SKIN RASH 09/21/2007  . BREAST PAIN, BILATERAL 08/22/2007  . CALCANEAL SPUR, RIGHT 08/22/2007  . HEADACHE 08/22/2007  . CALLUSES, FEET, BILATERAL 08/08/2007  . FOOT PAIN, BILATERAL 08/08/2007  . CANDIDIASIS 07/18/2007  . LUMBAGO 07/18/2007  . HELICOBACTER PYLORI GASTRITIS, HX OF 05/30/2007  . POSITIVE PPD 05/18/2007    Dorene Ar , PTA 12/14/2016, 11:25 AM  [facility] [address] [place], [place], [address] Phone: [phone]   Fax:  [phone]  Name: Kristin Joseph MRN: [id number] Date of Birth: 1970-05-14

## 2016-12-15 ENCOUNTER — Other Ambulatory Visit: Payer: Self-pay

## 2016-12-15 ENCOUNTER — Ambulatory Visit: Payer: Medicaid Other | Admitting: Gastroenterology

## 2016-12-16 ENCOUNTER — Encounter: Payer: Self-pay | Admitting: Physical Therapy

## 2016-12-21 ENCOUNTER — Ambulatory Visit: Payer: Medicaid Other | Attending: Internal Medicine | Admitting: Physical Therapy

## 2016-12-21 DIAGNOSIS — M5442 Lumbago with sciatica, left side: Secondary | ICD-10-CM | POA: Diagnosis present

## 2016-12-21 DIAGNOSIS — M62838 Other muscle spasm: Secondary | ICD-10-CM | POA: Diagnosis present

## 2016-12-21 DIAGNOSIS — R293 Abnormal posture: Secondary | ICD-10-CM

## 2016-12-21 DIAGNOSIS — M25512 Pain in left shoulder: Secondary | ICD-10-CM | POA: Diagnosis present

## 2016-12-21 DIAGNOSIS — M5441 Lumbago with sciatica, right side: Secondary | ICD-10-CM | POA: Insufficient documentation

## 2016-12-21 DIAGNOSIS — M542 Cervicalgia: Secondary | ICD-10-CM | POA: Insufficient documentation

## 2016-12-21 NOTE — Patient Instructions (Signed)
  Side Pull: Double Arm   On back, knees bent, feet flat. Arms perpendicular to body, shoulder level, elbows straight but relaxed. Pull arms out to sides, elbows straight. Resistance band comes across collarbones, hands toward floor. Hold momentarily. Slowly return to starting position. Repeat _15__ times. Band color _R___    Shoulder Rotation: Double Arm   On back, knees bent, feet flat, elbows tucked at sides, bent 90, hands palms up. Pull hands apart and down toward floor, keeping elbows near sides. Hold momentarily. Slowly return to starting position. Repeat _15__ times. Band color __R____

## 2016-12-21 NOTE — Therapy (Signed)
[place], [place], [address] Phone: [phone]   Fax:  [phone]  Physical Therapy Treatment  Patient Details  Name: Kristin Joseph MRN: [id number] Date of Birth: 06-28-1970 Referring Provider: Kevan Ny, MD   Encounter Date: 12/21/2016  PT End of Session - 12/21/16 1229    Visit Number  7    Number of Visits  12    Date for PT Re-Evaluation  01/15/17    Authorization Type  Medicaid    Authorization Time Period   11/15-12/26/2018    Authorization - Visit Number  3    Authorization - Number of Visits  12    PT Start Time  6644    PT Stop Time  0347    PT Time Calculation (min)  57 min       Past Medical History:  Diagnosis Date  . Back pain   . Depression   . Fibromyalgia 10/25/2012  . Gastritis 06/28/2012   Mild  . Obesity   . Pelvic pain   . Stress headaches   . Vitamin D deficiency     Past Surgical History:  Procedure Laterality Date  . APPENDECTOMY    . FOOT SURGERY Bilateral    Plantar fasciitis  . THYROIDECTOMY, PARTIAL     pt denies    There were no vitals filed for this visit.  Subjective Assessment - 12/21/16 1155    Subjective  The heat really helps reduce my pain for 2-3 hours. I want to buy a moist hot pack.     Currently in Pain?  Yes    Pain Score  7     Pain Location  Neck    Pain Orientation  Left;Mid    Pain Score  7    Pain Location  Back    Pain Orientation  Upper;Mid;Lower                      [facility] Adult PT Treatment/Exercise - 12/21/16 0001      Neck Exercises: Supine   Neck Retraction  10 reps    Other Supine Exercise  supine red band horizontal abduction, ER x 20 each      Lumbar Exercises: Stretches   Lower Trunk Rotation Limitations  upper trunk rotations x 5 each       Lumbar Exercises: Quadruped   Single Arm Raise  5 reps    Straight Leg Raise  5 reps    Opposite Arm/Leg Raise  5 reps      Manual Therapy   Soft tissue  mobilization  left paracervical , Upper trap and levator    Passive ROM  cervical rotation and side bend              PT Education - 12/21/16 1236    Education provided  Yes    Education Details  HEP    Person(s) Educated  Patient    Methods  Explanation;Handout    Comprehension  Verbalized understanding       PT Short Term Goals - 12/03/16 1621      PT SHORT TERM GOAL #1   Title  She will be independent with intial HEP    Baseline  added to cervical HEP    Time  4    Period  Weeks    Status  On-going      PT SHORT TERM GOAL #2   Title  she will report neck pain improved 30%  and increased active rotation to 45 degrees    Baseline  AROM right 45  left 35    Time  8    Period  Weeks    Status  On-going      PT SHORT TERM GOAL #3   Title  she will report LBP decreased 30% and incr flexion to 45 degrees     Baseline  reports pain at 8/10 in back and neck today    Time  8    Period  Weeks    Status  On-going        PT Long Term Goals - 11/17/16 1133      PT LONG TERM GOAL #1   Title  independent with all HEP issued    Baseline  independent with initial HEP    Time  10    Status  On-going      PT LONG TERM GOAL #2   Title  She will report neck and back pain as intermittant    Baseline  constant pain    Time  10    Period  Weeks    Status  On-going      PT LONG TERM GOAL #3   Title  She will be able to lift and carry normal packages without assist due to decr pain    Baseline  Gets people to carry items for her due to pain though she is able to carry her bag she brings to clinic    Time  10    Period  Weeks    Status  On-going      PT LONG TERM GOAL #4   Title  Her neck motion will be WNL with 1/10 max pain    Baseline  passive > 45 degrees active , 45 degreees rotation  and 15 degree ssidebend    Time  8    Period  Weeks    Status  On-going      PT LONG TERM GOAL #5   Title  She will have no posterior leg pain with movement of lower back     Baseline  posterior leg pain with trunk extension and sidebend    Time  10    Status  On-going      PT LONG TERM GOAL #6   Title  She will return to normal home tasks such as vacuuming with 1-2 max pain    Baseline  not doing heavier home tasks/ getting help    Time  10    Status  On-going            Plan - 12/21/16 1231    Clinical Impression Statement  Full PROM side bend and rotation. Trigger point release to left cerical and upper trap. Pt continues with hight levels of pain and reports moist heat is helpful for 2-3 hours. She reports waking multiple times at night due to pain. Attempted quadruped  core exercise with pt having difficulty stabilizing and required max cues. Repeated scab stabilization with theraband and upadated HEP. HMP at end of session for pain relief.     PT Next Visit Plan  Manual stretching and STW and modalities progress Cervical HEP done and can progress.  Needs HEP for  back basic movement, TRY IFC? TPDN?    PT Home Exercise Plan  LAQ, Sling exer for shoulder, LTR, PPT, single knee to chest,  cervical ROM all planes, cervical isometrics and neck stretches levator and upper trap    Consulted  and Agree with Plan of Care  Patient       Patient will benefit from skilled therapeutic intervention in order to improve the following deficits and impairments:  Postural dysfunction, Decreased strength, Decreased activity tolerance, Pain, Increased muscle spasms, Decreased range of motion  Visit Diagnosis: Acute bilateral low back pain with bilateral sciatica  Cervicalgia  Abnormal posture  Other muscle spasm  Acute pain of left shoulder     Problem List Patient Active Problem List   Diagnosis Date Noted  . Fibromyalgia 10/25/2012  . LLQ abdominal pain 09/08/2012  . Pelvic pain in female 10/02/2011  . Oligomenorrhea 05/20/2011  . VITAMIN D DEFICIENCY 02/15/2009  . OBESITY 06/07/2008  . ANEMIA 04/19/2008  . 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 FATIGUE  04/18/2008  . SKIN RASH 09/21/2007  . BREAST PAIN, BILATERAL 08/22/2007  . CALCANEAL SPUR, RIGHT 08/22/2007  . HEADACHE 08/22/2007  . CALLUSES, FEET, BILATERAL 08/08/2007  . FOOT PAIN, BILATERAL 08/08/2007  . CANDIDIASIS 07/18/2007  . LUMBAGO 07/18/2007  . HELICOBACTER PYLORI GASTRITIS, HX OF 05/30/2007  . POSITIVE PPD 05/18/2007    Dorene Ar , PTA 12/21/2016, 12:51 PM  [facility] [address] [place], [place], [address] Phone: [phone]   Fax:  [phone]  Name: Kristin Joseph MRN: [id number] Date of Birth: 1970-09-19

## 2016-12-23 ENCOUNTER — Encounter: Payer: Self-pay | Admitting: Physical Therapy

## 2016-12-23 ENCOUNTER — Ambulatory Visit: Payer: Medicaid Other | Admitting: Physical Therapy

## 2016-12-23 DIAGNOSIS — M542 Cervicalgia: Secondary | ICD-10-CM

## 2016-12-23 DIAGNOSIS — M5442 Lumbago with sciatica, left side: Secondary | ICD-10-CM | POA: Diagnosis not present

## 2016-12-23 DIAGNOSIS — R293 Abnormal posture: Secondary | ICD-10-CM

## 2016-12-23 DIAGNOSIS — M5441 Lumbago with sciatica, right side: Secondary | ICD-10-CM

## 2016-12-23 DIAGNOSIS — M25512 Pain in left shoulder: Secondary | ICD-10-CM

## 2016-12-23 DIAGNOSIS — M62838 Other muscle spasm: Secondary | ICD-10-CM

## 2016-12-23 NOTE — Therapy (Signed)
[place], [place], [address] Phone: [phone]   Fax:  [phone]  Physical Therapy Treatment  Patient Details  Name: Kristin Joseph MRN: [id number] Date of Birth: Oct 21, 1970 Referring Provider: Kevan Ny, MD   Encounter Date: 12/23/2016  PT End of Session - 12/23/16 1553    Visit Number  8    Number of Visits  12    Date for PT Re-Evaluation  01/15/17    Authorization - Visit Number  4    Authorization - Number of Visits  12    PT Start Time  2376    PT Stop Time  1426    PT Time Calculation (min)  49 min    Activity Tolerance  Patient tolerated treatment well    Behavior During Therapy  [facility] for tasks assessed/performed       Past Medical History:  Diagnosis Date  . Back pain   . Depression   . Fibromyalgia 10/25/2012  . Gastritis 06/28/2012   Mild  . Obesity   . Pelvic pain   . Stress headaches   . Vitamin D deficiency     Past Surgical History:  Procedure Laterality Date  . APPENDECTOMY    . FOOT SURGERY Bilateral    Plantar fasciitis  . THYROIDECTOMY, PARTIAL     pt denies    There were no vitals filed for this visit.  Subjective Assessment - 12/23/16 1340    Subjective  I want heat.  PT is helping a lot with pain.  PT lasts  a few hours.    Currently in Pain?  Yes    Pain Score  6     Pain Orientation  Left;Mid    Aggravating Factors   moving, housework    Pain Relieving Factors  heat meds    Pain Score  7    Pain Location  Back    Pain Orientation  Upper;Mid;Lower    Pain Descriptors / Indicators  Aching    Pain Radiating Towards  down legs to feet,      Pain Frequency  Constant    Aggravating Factors   accident    Pain Relieving Factors  heat meds                      [facility] Adult PT Treatment/Exercise - 12/23/16 0001      Lumbar Exercises: Stretches   Single Knee to Chest Stretch  3 reps;30 seconds    Lower Trunk Rotation  5 reps    Pelvic Tilt   10 seconds cued    Piriformis Stretch  3 reps 3po scconds,  cued for gentle, non bounce.       Lumbar Exercises: Supine   Clam  10 reps    Bent Knee Raise  10 reps cued,  tends to rush    Bridge  10 reps HEP      Lumbar Exercises: Sidelying   Clam  10 reps each,  cued      Knee/Hip Exercises: Stretches   Sports administrator  Right;3 reps;30 seconds    Hip Flexor Stretch  Right;3 reps;30 seconds    Piriformis Stretch  3 reps;30 seconds      Modalities   Modalities  Electrical Stimulation      Moist Heat Therapy   Number Minutes Moist Heat  15 Minutes    Moist Heat Location  Cervical;Lumbar Spine      Electrical Stimulation   Electrical Stimulation  Location  cervical,  lumbar    Electrical Stimulation Action  IFC    Electrical Stimulation Parameters  5    Electrical Stimulation Goals  Pain      Manual Therapy   Soft tissue mobilization  lateral hips,  tissue softened    Passive ROM  hips ROM improved             PT Education - 12/23/16 1553    Education provided  Yes    Education Details  HEP    Person(s) Educated  Patient    Methods  Explanation;Tactile cues;Verbal cues;Handout    Comprehension  Verbalized understanding;Returned demonstration       PT Short Term Goals - 12/23/16 1557      PT SHORT TERM GOAL #1   Title  She will be independent with intial HEP    Baseline  Continue to add to HEP.  Minor cues for technique for current  exercises.      Time  4    Period  Weeks    Status  On-going      PT SHORT TERM GOAL #2   Title  she will report neck pain improved 30% and increased active rotation to 45 degrees    Time  8    Period  Weeks    Status  Unable to assess      PT SHORT TERM GOAL #3   Title  she will report LBP decreased 30% and incr flexion to 45 degrees     Baseline  pain varies ,  unable to give %    Time  8    Period  Weeks    Status  On-going        PT Long Term Goals - 11/17/16 1133      PT LONG TERM GOAL #1   Title  independent with  all HEP issued    Baseline  independent with initial HEP    Time  10    Status  On-going      PT LONG TERM GOAL #2   Title  She will report neck and back pain as intermittant    Baseline  constant pain    Time  10    Period  Weeks    Status  On-going      PT LONG TERM GOAL #3   Title  She will be able to lift and carry normal packages without assist due to decr pain    Baseline  Gets people to carry items for her due to pain though she is able to carry her bag she brings to clinic    Time  10    Period  Weeks    Status  On-going      PT LONG TERM GOAL #4   Title  Her neck motion will be WNL with 1/10 max pain    Baseline  passive > 45 degrees active , 45 degreees rotation  and 15 degree ssidebend    Time  8    Period  Weeks    Status  On-going      PT LONG TERM GOAL #5   Title  She will have no posterior leg pain with movement of lower back    Baseline  posterior leg pain with trunk extension and sidebend    Time  10    Status  On-going      PT LONG TERM GOAL #6   Title  She will return to normal home tasks  such as vacuuming with 1-2 max pain    Baseline  not doing heavier home tasks/ getting help    Time  10    Status  On-going            Plan - 12/23/16 1554    Clinical Impression Statement  Patient arrived late and had to leave early for another appointment. .  Manual to lateral hips and low back with light pressure followed by stretching was helpful with back pain.  Able to progress HEP for low back.Patient was able to roll and push up to sitting with less pain and less time at end of session.    PT Next Visit Plan  Manual stretching and STW and modalities progress Cervical HEP done and can progress.  Needs HEP for  back basic movement, Assess IFC? TPDN?    PT Home Exercise Plan  LAQ, Sling exer for shoulder, LTR, PPT, single knee to chest,  cervical ROM all planes, cervical isometrics and neck stretches levator and upper trap,  Bridge    Consulted and Agree with  Plan of Care  Patient       Patient will benefit from skilled therapeutic intervention in order to improve the following deficits and impairments:  Postural dysfunction, Decreased strength, Decreased activity tolerance, Pain, Increased muscle spasms, Decreased range of motion  Visit Diagnosis: Acute bilateral low back pain with bilateral sciatica  Cervicalgia  Abnormal posture  Other muscle spasm  Acute pain of left shoulder     Problem List Patient Active Problem List   Diagnosis Date Noted  . Fibromyalgia 10/25/2012  . LLQ abdominal pain 09/08/2012  . Pelvic pain in female 10/02/2011  . Oligomenorrhea 05/20/2011  . VITAMIN D DEFICIENCY 02/15/2009  . OBESITY 06/07/2008  . ANEMIA 04/19/2008  . 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 FATIGUE 04/18/2008  . SKIN RASH 09/21/2007  . BREAST PAIN, BILATERAL 08/22/2007  . CALCANEAL SPUR, RIGHT 08/22/2007  . HEADACHE 08/22/2007  . CALLUSES, FEET, BILATERAL 08/08/2007  . FOOT PAIN, BILATERAL 08/08/2007  . CANDIDIASIS 07/18/2007  . LUMBAGO 07/18/2007  . HELICOBACTER PYLORI GASTRITIS, HX OF 05/30/2007  . POSITIVE PPD 05/18/2007    HARRIS,KAREN PTA 12/23/2016, 4:08 PM  [facility] [address] [place], [place], [address] Phone: [phone]   Fax:  [phone]  Name: Kristin Joseph MRN: [id number] Date of Birth: [birth year]

## 2016-12-23 NOTE — Patient Instructions (Signed)
Bridge    Lie back, legs bent. Inhale, pressing hips up. Keeping ribs in, lengthen lower back. Exhale, rolling down along spine from top. Repeat _10___ times. Do _1___ sessions per day.  [email or web address].exer.[email or web address]   Copyright  VHI. All rights reserved.   

## 2016-12-28 ENCOUNTER — Ambulatory Visit: Payer: Medicaid Other | Admitting: Physical Therapy

## 2016-12-30 ENCOUNTER — Ambulatory Visit: Payer: Medicaid Other | Admitting: Physical Therapy

## 2016-12-31 ENCOUNTER — Ambulatory Visit: Payer: Medicaid Other

## 2016-12-31 DIAGNOSIS — M5442 Lumbago with sciatica, left side: Principal | ICD-10-CM

## 2016-12-31 DIAGNOSIS — M542 Cervicalgia: Secondary | ICD-10-CM

## 2016-12-31 DIAGNOSIS — M62838 Other muscle spasm: Secondary | ICD-10-CM

## 2016-12-31 DIAGNOSIS — M5441 Lumbago with sciatica, right side: Secondary | ICD-10-CM

## 2016-12-31 DIAGNOSIS — M25512 Pain in left shoulder: Secondary | ICD-10-CM

## 2016-12-31 DIAGNOSIS — R293 Abnormal posture: Secondary | ICD-10-CM

## 2016-12-31 NOTE — Therapy (Addendum)
[place], [place], [address] Phone: [phone]   Fax:  [phone]  Physical Therapy Treatment/Discharge  Patient Details  Name: Kristin Joseph MRN: [id number] Date of Birth: 11-23-1970 Referring Provider: Kevan Ny, MD   Encounter Date: 12/31/2016  PT End of Session - 12/31/16 1321    Visit Number  9    Number of Visits  12    Date for PT Re-Evaluation  01/15/17    Authorization Type  Medicaid    Authorization Time Period   11/15-12/26/2018    Authorization - Visit Number  5    Authorization - Number of Visits  12    PT Start Time  0125    PT Stop Time  0230    PT Time Calculation (min)  65 min    Activity Tolerance  Patient tolerated treatment well    Behavior During Therapy  [facility] for tasks assessed/performed       Past Medical History:  Diagnosis Date  . Back pain   . Depression   . Fibromyalgia 10/25/2012  . Gastritis 06/28/2012   Mild  . Obesity   . Pelvic pain   . Stress headaches   . Vitamin D deficiency     Past Surgical History:  Procedure Laterality Date  . APPENDECTOMY    . FOOT SURGERY Bilateral    Plantar fasciitis  . THYROIDECTOMY, PARTIAL     pt denies    There were no vitals filed for this visit.  Subjective Assessment - 12/31/16 1342    Subjective  She reports doing self caare now getting minor assist from family. She reports she is assisting with Lt hand for light home tasks now.  She can do HEP now.  Assisting with Lt arm to fold clothes    Currently in Pain?  Yes    Pain Score  7     Pain Location  Neck    Pain Orientation  Left    Pain Descriptors / Indicators  Tightness;Throbbing    Pain Type  Chronic pain    Pain Onset  More than a month ago    Pain Frequency  Constant    Aggravating Factors   activity    Pain Relieving Factors  heat , meds    Pain Score  5    Pain Location  Back    Pain Descriptors / Indicators  Aching    Pain Type  -- sub acute     Pain Onset  More than a month ago    Pain Frequency  Constant    Aggravating Factors   with less rest. 9takes nap during day, lift , bend    Pain Relieving Factors  heat , meds          [facility] PT Assessment - 12/31/16 0001      AROM   Cervical Flexion  45    Cervical Extension  30    Cervical - Right Side Bend  25    Cervical - Left Side Bend  20    Cervical - Right Rotation  45    Cervical - Left Rotation  40    Lumbar Flexion  60    Lumbar Extension  20    Lumbar - Right Side Bend  15    Lumbar - Left Side Bend  15      Strength   Overall Strength Comments  Continued decr effort but with greater force today  Palpation   Palpation comment  tender over whole back and neck, LT > RT                   [facility] Adult PT Treatment/Exercise - 12/31/16 0001      Neck Exercises: Supine   Other Supine Exercise  supine yellow and horizontal abduction, ER x 20 each      Lumbar Exercises: Stretches   Single Knee to Chest Stretch  3 reps;30 seconds    Lower Trunk Rotation  5 reps    Pelvic Tilt  10 seconds 10 reps    Piriformis Stretch  3 reps      Moist Heat Therapy   Number Minutes Moist Heat  15 Minutes    Moist Heat Location  Cervical;Lumbar Spine        All HEP reviewed. Needs cues for all exercises     PT Education - 12/31/16 1416    Education provided  Yes    Education Details  need to show improvement for Medicaid  auth    Person(s) Educated  Patient    Methods  Explanation    Comprehension  Verbalized understanding       PT Short Term Goals - 12/31/16 1407      PT SHORT TERM GOAL #1   Title  She will be independent with intial HEP    Baseline  She is able to do the HEP but not able to demo without cuing     Status  Partially Met      PT SHORT TERM GOAL #2   Title  she will report neck pain improved 30% and increased active rotation to 45 degrees    Baseline  25-30% better but LT cervical rotation only 40 degrees    Status  On-going       PT SHORT TERM GOAL #3   Title  she will report LBP decreased 30% and incr flexion to 45 degrees     Baseline  varies but some days yes and lumbar flexion incr to 60 degrees    Status  On-going        PT Long Term Goals - 12/31/16 1411      PT LONG TERM GOAL #1   Title  independent with all HEP issued    Baseline  still needs cues with ROM    Status  On-going      PT LONG TERM GOAL #2   Title  She will report neck and back pain as intermittant    Baseline  constant pain    Status  On-going      PT LONG TERM GOAL #3   Title  She will be able to lift and carry normal packages without assist due to decr pain    Baseline  Gets people to carry items for her due to pain though she is able to carry her bag she brings to clinic    Status  On-going      PT LONG TERM GOAL #4   Title  Her neck motion will be WNL with 1/10 max pain    Baseline  Pain  5/10 or more and 45 degrees only RT rotation   side bend 20-25 degrees degrees    Status  On-going      PT LONG TERM GOAL #5   Title  She will have no posterior leg pain with movement of lower back    Baseline  posterior leg pain with trunk extension and sidebend  still     Status  On-going      PT LONG TERM GOAL #6   Title  She will return to normal home tasks such as vacuuming with 1-2 max pain    Baseline  not doing heavier home tasks/ getting help even with moderate tasks    Status  On-going            Plan - 12/31/16 1417    Clinical Impression Statement  She measures bette rwith ROm and she gives more resistance with MMT Her pain levels are slightly lower. It is unclesr she does her HEP as she still needes cues. She ius able to long sit without complaint ands is able to ER to 80 degrees and abduct to 100 degree sand adjust cervical heat without complaint of pain. She is able to lean forward and sideway for 10-15 sec to adjust purse without  indicaiton of significant pain.     PT Treatment/Interventions  Patient/family  education;Passive range of motion;Manual techniques;Therapeutic exercise;Therapeutic activities;Electrical Stimulation;Moist Heat;Dry needling    PT Next Visit Plan  Manual stretching and STW and modalities progress Cervical HEP done and can progress.  Needs HEP for  back basic movement, Assess IFC? TPDN?    PT Home Exercise Plan  LAQ, Sling exer for shoulder, LTR, PPT, single knee to chest,  cervical ROM all planes, cervical isometrics and neck stretches levator and upper trap,  Bridge    Consulted and Agree with Plan of Care  Patient       Patient will benefit from skilled therapeutic intervention in order to improve the following deficits and impairments:  Postural dysfunction, Decreased strength, Decreased activity tolerance, Pain, Increased muscle spasms, Decreased range of motion  Visit Diagnosis: Acute bilateral low back pain with bilateral sciatica  Cervicalgia  Abnormal posture  Other muscle spasm  Acute pain of left shoulder     Problem List Patient Active Problem List   Diagnosis Date Noted  . Fibromyalgia 10/25/2012  . LLQ abdominal pain 09/08/2012  . Pelvic pain in female 10/02/2011  . Oligomenorrhea 05/20/2011  . VITAMIN D DEFICIENCY 02/15/2009  . OBESITY 06/07/2008  . ANEMIA 04/19/2008  . 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 FATIGUE 04/18/2008  . SKIN RASH 09/21/2007  . BREAST PAIN, BILATERAL 08/22/2007  . CALCANEAL SPUR, RIGHT 08/22/2007  . HEADACHE 08/22/2007  . CALLUSES, FEET, BILATERAL 08/08/2007  . FOOT PAIN, BILATERAL 08/08/2007  . CANDIDIASIS 07/18/2007  . LUMBAGO 07/18/2007  . HELICOBACTER PYLORI GASTRITIS, HX OF 05/30/2007  . POSITIVE PPD 05/18/2007    Darrel Hoover  PT 12/31/2016, 2:23 PM  [facility] [address] [place], [place], [address] Phone: [phone]   Fax:  [phone]  Name: Darlis Wragg MRN: [id number] Date of Birth: October 13, 1970  PHYSICAL THERAPY DISCHARGE SUMMARY  Visits  from Start of Care: 9  Current functional level related to goals / functional outcomes: Unknown as Ms Napp  Did not return after this session   Remaining deficits: Unknown. She did not have transportation at one point and then her Medicaid auth limit was reached. She will need a new order to return though her progress may have been as much time as PT.   Education / Equipment: HEP  Plan: Patient agrees to discharge.  Patient goals were not met. Patient is being discharged due to not returning since the last visit.  ?????    Noralee Stain PT 01/28/17

## 2017-01-05 ENCOUNTER — Ambulatory Visit: Payer: Medicaid Other | Admitting: Physical Therapy

## 2017-01-06 ENCOUNTER — Ambulatory Visit: Payer: Medicaid Other | Admitting: Physical Therapy

## 2017-01-08 ENCOUNTER — Ambulatory Visit: Payer: Medicaid Other | Admitting: Physical Therapy

## 2017-01-13 ENCOUNTER — Ambulatory Visit: Payer: Medicaid Other

## 2017-01-14 ENCOUNTER — Encounter: Payer: Medicaid Other | Admitting: Physical Therapy

## 2017-01-21 ENCOUNTER — Encounter: Payer: Medicaid Other | Admitting: Physical Therapy

## 2017-02-02 ENCOUNTER — Encounter: Payer: Self-pay | Admitting: Pediatrics
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
## 2017-02-10 ENCOUNTER — Ambulatory Visit: Payer: Medicaid Other | Admitting: Internal Medicine

## 2017-02-10 ENCOUNTER — Other Ambulatory Visit: Payer: Self-pay

## 2017-02-10 ENCOUNTER — Encounter: Payer: Self-pay | Admitting: Internal Medicine

## 2017-02-10 VITALS — BP 113/70 | HR 81 | Temp 97.9°F | Ht 67.0 in | Wt 186.3 lb

## 2017-02-10 DIAGNOSIS — M5442 Lumbago with sciatica, left side: Secondary | ICD-10-CM

## 2017-02-10 DIAGNOSIS — M545 Low back pain: Secondary | ICD-10-CM

## 2017-02-10 DIAGNOSIS — G8929 Other chronic pain: Secondary | ICD-10-CM | POA: Diagnosis not present

## 2017-02-10 DIAGNOSIS — M542 Cervicalgia: Secondary | ICD-10-CM | POA: Diagnosis not present

## 2017-02-10 DIAGNOSIS — M797 Fibromyalgia: Secondary | ICD-10-CM | POA: Diagnosis not present

## 2017-02-10 DIAGNOSIS — M5441 Lumbago with sciatica, right side: Secondary | ICD-10-CM

## 2017-02-10 MED ORDER — CYCLOBENZAPRINE HCL 5 MG PO TABS: 5.0000 mg | ORAL_TABLET | Freq: Every day | ORAL | 0 refills | Status: DC

## 2017-02-10 MED ORDER — TRAMADOL HCL 50 MG PO TABS: 50.0000 mg | ORAL_TABLET | Freq: Two times a day (BID) | ORAL | 0 refills | Status: DC

## 2017-02-10 MED ORDER — DULOXETINE HCL 30 MG PO CPEP: 30.0000 mg | ORAL_CAPSULE | Freq: Every day | ORAL | 2 refills | Status: DC

## 2017-02-10 NOTE — Progress Notes (Signed)
   CC: chronic back pain  HPI:  [name] is a 47 y.o. female with history noted below that presents to the acute care clinic for chronic back pain. Please see problem based charting for status of patient's chronic medical conditions.  Past Medical History:  Diagnosis Date  . Back pain   . Depression   . Fibromyalgia 10/25/2012  . Gastritis 06/28/2012   Mild  . Obesity   . Pelvic pain   . Stress headaches   . Vitamin D deficiency     Review of Systems:  Review of Systems  Respiratory: Negative for shortness of breath.   Cardiovascular: Negative for chest pain.  Gastrointestinal: Negative for abdominal pain, nausea and vomiting.  Genitourinary: Negative for dysuria, frequency and urgency.  Musculoskeletal: Positive for back pain and neck pain. Negative for falls.  Neurological: Negative for tingling, focal weakness and weakness.     Physical Exam:  Vitals:   02/10/17 1350  BP: 113/70  Pulse: 81  Temp: 97.9 F (36.6 C)  TempSrc: Oral  SpO2: 100%  Weight: 186 lb 4.8 oz (84.5 kg)  Height: 5\' 7"  (1.702 m)   Physical Exam  Cardiovascular: Normal rate, regular rhythm and normal heart sounds. Exam reveals no gallop and no friction rub.  No murmur heard. Pulmonary/Chest: Effort normal and breath sounds normal. No respiratory distress. She has no wheezes. She has no rales.  Musculoskeletal: She exhibits no edema.  5/5 motor strength in upper and lower extremities bilaterally  Positive straight leg raise bilaterally  Tenderness and tightness to cervical, thoracic and lumbar paraspinal musculature    Assessment & Plan:   See encounters tab for problem based medical decision making.   Patient discussed with Dr. Daryll Drown

## 2017-02-10 NOTE — Assessment & Plan Note (Signed)
Assessment: Chronic back pain Patient has a history of chronic back pain since 2012 worsening in the past 3 years. She has had MRI evaluation of the cervical, thoracic and lumbar spine per notes that have been unremarkable.  On physical exam patient had tenderness and tightness to cervical, thoracic and lumbar paraspinal musculature.  She's tried NSAIDs for several years with little benefit. I recommended yoga and low impact exercises. Will try a trial of tramadol daily and use a muscle relaxant as needed at night.  Plan -tramadol -flexeril - yoga

## 2017-02-10 NOTE — Assessment & Plan Note (Addendum)
Assessment:  Fibromyalgia Patient has a chronic history of total body pain including the shoulders, neck, head, back, legs and feet. Patient has been seen by neurology in the past and per notes has had MRI evaluation of the cervical, thoracic, and lumbosacral spines that were unremarkable. Also noted in  are EMG and nerve conduction studies involving the lower extremities that were unremarkable. She has tried Gabapentin and lyrica in the past with little benefit.  Will try duloxetine.  Also patient expressed interest in investigating rheumatological causes of her pain.  She denies joint swelling at this time.  Will get ana, RF, CCP, CRP, and ESR  Plan -duloxetine  - ana, RF, CCP, CRP, and ESR  Addendum:  CRP was mildly elevated.  ESR slightly above normal.  Other lab work, ana, RF,and ccp was normal.  Since CRP and ESR minimally elevated could be due to insulin resistance or obesity.  Would consider getting a HgbA1C due to abnormal glucose at next visit.

## 2017-02-10 NOTE — Patient Instructions (Addendum)
Kristin Joseph,  It was a pleasure meeting you today. Please follow up in one month.  Please takes duloxetine 30 mg once daily Please take tramadol 50 mg twice a day Please take Flexeril only as needed once at night

## 2017-02-11 NOTE — Progress Notes (Signed)
Internal Medicine Clinic Attending  Case discussed with Dr. Hoffman at the time of the visit.  We reviewed the resident's history and exam and pertinent patient test results.  I agree with the assessment, diagnosis, and plan of care documented in the resident's note.  

## 2017-02-12 LAB — CMP14 + ANION GAP
ALBUMIN: 4.2 g/dL (ref 3.5–5.5)
ALT: 17 IU/L (ref 0–32)
ANION GAP: 15 mmol/L (ref 10.0–18.0)
AST: 14 IU/L (ref 0–40)
Albumin/Globulin Ratio: 1.6 (ref 1.2–2.2)
Alkaline Phosphatase: 65 IU/L (ref 39–117)
BUN / CREAT RATIO: 27 — AB (ref 9–23)
BUN: 14 mg/dL (ref 6–24)
Bilirubin Total: 0.2 mg/dL (ref 0.0–1.2)
CALCIUM: 9.2 mg/dL (ref 8.7–10.2)
CO2: 23 mmol/L (ref 20–29)
Chloride: 104 mmol/L (ref 96–106)
Creatinine, Ser: 0.51 mg/dL — ABNORMAL LOW (ref 0.57–1.00)
GFR, EST AFRICAN AMERICAN: 133 mL/min/{1.73_m2} (ref >59)
GFR, EST NON AFRICAN AMERICAN: 116 mL/min/{1.73_m2} (ref >59)
Globulin, Total: 2.6 g/dL (ref 1.5–4.5)
Glucose: 124 mg/dL — ABNORMAL HIGH (ref 65–99)
Potassium: 4.3 mmol/L (ref 3.5–5.2)
Sodium: 142 mmol/L (ref 134–144)
TOTAL PROTEIN: 6.8 g/dL (ref 6.0–8.5)

## 2017-02-12 LAB — C-REACTIVE PROTEIN: CRP: 9.7 mg/L — AB (ref 0.0–4.9)

## 2017-02-12 LAB — CK: Total CK: 137 U/L (ref 24–173)

## 2017-02-12 LAB — CBC
HEMATOCRIT: 37.4 % (ref 34.0–46.6)
HEMOGLOBIN: 12.2 g/dL (ref 11.1–15.9)
MCH: 29.7 pg (ref 26.6–33.0)
MCHC: 32.6 g/dL (ref 31.5–35.7)
MCV: 91 fL (ref 79–97)
Platelets: 284 10*3/uL (ref 150–379)
RBC: 4.11 x10E6/uL (ref 3.77–5.28)
RDW: 13.2 % (ref 12.3–15.4)
WBC: 4.6 10*3/uL (ref 3.4–10.8)

## 2017-02-12 LAB — ANTINUCLEAR ANTIBODIES, IFA: ANA Titer 1: NEGATIVE

## 2017-02-12 LAB — SEDIMENTATION RATE: Sed Rate: 37 mm/hr — ABNORMAL HIGH (ref 0–32)

## 2017-02-12 LAB — RHEUMATOID FACTOR: Rhuematoid fact SerPl-aCnc: <10 IU/mL (ref 0.0–13.9)

## 2017-02-12 LAB — CYCLIC CITRUL PEPTIDE ANTIBODY, IGG/IGA: Cyclic Citrullin Peptide Ab: 8 units (ref 0–19)

## 2017-02-17 ENCOUNTER — Telehealth: Payer: Self-pay | Admitting: *Deleted

## 2017-02-17 NOTE — Telephone Encounter (Signed)
Information was faxed to [facility] for PA for Tramadol 50 mg Tablets.  Awaiting  Decision from [facility].  Sander Nephew, RN 02/17/2017 2:34 PM.

## 2017-02-18 ENCOUNTER — Encounter (INDEPENDENT_AMBULATORY_CARE_PROVIDER_SITE_OTHER): Payer: Self-pay | Admitting: Pediatrics

## 2017-02-26 ENCOUNTER — Ambulatory Visit: Payer: Medicaid Other | Attending: Internal Medicine | Admitting: Physical Therapy

## 2017-02-26 ENCOUNTER — Encounter: Payer: Self-pay | Admitting: Physical Therapy

## 2017-02-26 ENCOUNTER — Other Ambulatory Visit: Payer: Self-pay

## 2017-02-26 DIAGNOSIS — M5442 Lumbago with sciatica, left side: Secondary | ICD-10-CM | POA: Insufficient documentation

## 2017-02-26 DIAGNOSIS — M5441 Lumbago with sciatica, right side: Secondary | ICD-10-CM | POA: Insufficient documentation

## 2017-02-26 DIAGNOSIS — M25512 Pain in left shoulder: Secondary | ICD-10-CM | POA: Diagnosis present

## 2017-02-26 DIAGNOSIS — M62838 Other muscle spasm: Secondary | ICD-10-CM | POA: Insufficient documentation

## 2017-02-26 DIAGNOSIS — R293 Abnormal posture: Secondary | ICD-10-CM | POA: Insufficient documentation

## 2017-02-26 DIAGNOSIS — M542 Cervicalgia: Secondary | ICD-10-CM | POA: Diagnosis present
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
## 2017-03-05 ENCOUNTER — Encounter: Payer: Self-pay | Admitting: Gastroenterology

## 2017-03-05 ENCOUNTER — Ambulatory Visit: Payer: Medicaid Other | Admitting: Gastroenterology

## 2017-03-05 VITALS — BP 112/70 | HR 80 | Ht 66.0 in | Wt 181.0 lb

## 2017-03-05 DIAGNOSIS — R1032 Left lower quadrant pain: Secondary | ICD-10-CM

## 2017-03-05 DIAGNOSIS — R143 Flatulence: Secondary | ICD-10-CM | POA: Diagnosis not present

## 2017-03-05 MED ORDER — HYOSCYAMINE SULFATE ER 0.375 MG PO TB12: 0.3750 mg | ORAL_TABLET | Freq: Two times a day (BID) | ORAL | 11 refills | Status: DC

## 2017-03-05 NOTE — Progress Notes (Signed)
Review of pertinent gastrointestinal problems: 1. Left sided abd pains:  2014: left sided abd pain, worse with eating or urinating;cbc, cmet normal; CT scan no clear etiology; MRI abd normal; +stones in GB but pain seems unrelated clinically; EGD 06/2012 mild gastritis, H pylori neg on pathology; 06/2012 pelvic US negative; Colonoscopy 08/2012 was normal.   Antispasm meds were helpful for a while.  HPI: This is a very pleasant 47 year old woman who was referred to me by Milus Banister, MD  to evaluate left lower quadrant pain.    Chief complaint is left side, left lower quadrant pain  She has intermittent LLQ pains.  Can be worse with certain movements, couging.  Eating too much .   Moving her bowels can sometimes relieve the pain.  The pain is nearly constant.  It is a cramping type sensation.  I last saw her 4 or 5 years ago for essentially the same issue.  Old Data Reviewed: CT scan October 2016 abdomen and pelvis with IV and oral contrast; done for left lower quadrant pain was essentially unrevealing besides chronic cholelithiasis    Review of systems: Pertinent positive and negative review of systems were noted in the above HPI section. All other review negative.   Past Medical History:  Diagnosis Date  . Back pain   . Depression   . Fibromyalgia 10/25/2012  . Gastritis 06/28/2012   Mild  . Obesity   . Pelvic pain   . Stress headaches   . Vitamin D deficiency     Past Surgical History:  Procedure Laterality Date  . APPENDECTOMY    . FOOT SURGERY Bilateral    Plantar fasciitis  . THYROIDECTOMY, PARTIAL     pt denies    Current Outpatient Medications  Medication Sig Dispense Refill  . Cyanocobalamin (B-12) 2500 MCG TABS Take 2,500 mcg by mouth daily.    . cyclobenzaprine (FLEXERIL) 5 MG tablet Take 1 tablet (5 mg total) by mouth at bedtime. 30 tablet 0  . DULoxetine (CYMBALTA) 30 MG capsule Take 1 capsule (30 mg total) by mouth daily. 30 capsule 2  . gabapentin  (NEURONTIN) 100 MG capsule Take 100 mg by mouth at bedtime.    . indomethacin (INDOCIN) 50 MG capsule Take 50 mg by mouth 3 (three) times daily with meals.    . methocarbamol (ROBAXIN) 500 MG tablet Take 1 tablet (500 mg total) by mouth 4 (four) times daily. 20 tablet 0  . naproxen (NAPROSYN) 500 MG tablet Take 1 tablet (500 mg total) by mouth 2 (two) times daily. 20 tablet 0  . Simethicone (GAS-X PO) Take by mouth daily as needed.    . traMADol (ULTRAM) 50 MG tablet Take 1 tablet (50 mg total) by mouth 2 (two) times daily. 60 tablet 0   No current facility-administered medications for this visit.     Allergies as of 03/05/2017  . (No Known Allergies)    Family History  Problem Relation Age of Onset  . Depression Mother   . Colon cancer Neg Hx     Social History   Socioeconomic History  . Marital status: Single    Spouse name: Not on file  . Number of children: 3  . Years of education: 7th grade  . Highest education level: Not on file  Social Needs  . Financial resource strain: Not on file  . Food insecurity - worry: Not on file  . Food insecurity - inability: Not on file  . Transportation needs - medical: Not  on file  . Transportation needs - non-medical: Not on file  Occupational History    Employer: UNEMPLOYED  Tobacco Use  . Smoking status: Never Smoker  . Smokeless tobacco: Never Used  Substance and Sexual Activity  . Alcohol use: No  . Drug use: No  . Sexual activity: No    Birth control/protection: None  Other Topics Concern  . Not on file  Social History Narrative  . Not on file     Physical Exam: BP 112/70   Pulse 80   Ht 5\' 6"  (1.676 m)   Wt 181 lb (82.1 kg)   BMI 29.21 kg/m  Constitutional: generally well-appearing Psychiatric: alert and oriented x3 Eyes: extraocular movements intact Mouth: oral pharynx moist, no lesions Neck: supple no lymphadenopathy Cardiovascular: heart regular rate and rhythm Lungs: clear to auscultation  bilaterally Abdomen: soft, nontender, nondistended, no obvious ascites, no peritoneal signs, normal bowel sounds Extremities: no lower extremity edema bilaterally Skin: no lesions on visible extremities   Assessment and plan: 47 y.o. female with chronic left lower quadrant pain  She has had this problem for many years and has undergone extensive workup in the past.  I do not think any of that needs to be repeated now.  We will call in twice daily scheduled antispasmodic medicine since they seem to help in the past.  I am not sure all of her left-sided pains are related to her GI tract since exercise so reliably makes them worse.  She also has a lot of issues with gassiness and I am recommending over-the-counter probiotic 1 pill once daily align.    Please see the "Patient Instructions" section for addition details about the plan.   Owens Loffler, MD [facility] 03/05/2017, 10:36 AM  Cc: Milus Banister, MD

## 2017-03-05 NOTE — Patient Instructions (Addendum)
Levbid .0375mg  pill one pill twice dialy disp 60 with 11 refills  Align over the counter 1 pill once daily.   Return as needed.  Normal BMI (Body Mass Index- based on height and weight) is between 19 and 25. Your BMI today is Body mass index is 29.21 kg/m. Marland Kitchen Please consider follow up  regarding your BMI with your Primary Care Provider.

## 2017-03-09 ENCOUNTER — Ambulatory Visit: Payer: Medicaid Other | Admitting: Physical Therapy

## 2017-03-09 ENCOUNTER — Encounter: Payer: Self-pay | Admitting: Physical Therapy

## 2017-03-09 DIAGNOSIS — M5442 Lumbago with sciatica, left side: Principal | ICD-10-CM

## 2017-03-09 DIAGNOSIS — M542 Cervicalgia: Secondary | ICD-10-CM

## 2017-03-09 DIAGNOSIS — R293 Abnormal posture: Secondary | ICD-10-CM

## 2017-03-09 DIAGNOSIS — M62838 Other muscle spasm: Secondary | ICD-10-CM

## 2017-03-09 DIAGNOSIS — M5441 Lumbago with sciatica, right side: Secondary | ICD-10-CM

## 2017-03-09 DIAGNOSIS — M25512 Pain in left shoulder: Secondary | ICD-10-CM

## 2017-03-09 NOTE — Therapy (Signed)
[address] [place], [place], [address] Phone: [phone]   Fax:  [phone]  Physical Therapy Treatment  Patient Details  Name: Kristin Joseph MRN: [id number] Date of Birth: [birth year] Referring Provider: Kevan Ny   Encounter Date: 03/09/2017  PT End of Session - 03/09/17 1344    Visit Number  2    Date for PT Re-Evaluation  04/26/17    PT Start Time  0935    PT Stop Time  1015    PT Time Calculation (min)  40 min    Activity Tolerance  Patient limited by pain    Behavior During Therapy  [facility] for tasks assessed/performed       Past Medical History:  Diagnosis Date  . Back pain   . Depression   . Fibromyalgia 10/25/2012  . Gastritis 06/28/2012   Mild  . Obesity   . Pelvic pain   . Stress headaches   . Vitamin D deficiency     Past Surgical History:  Procedure Laterality Date  . APPENDECTOMY    . FOOT SURGERY Bilateral    Plantar fasciitis  . THYROIDECTOMY, PARTIAL     pt denies    There were no vitals filed for this visit.  Subjective Assessment - 03/09/17 0948    Subjective  Patient reports that she continues to have neck and back pain, c/o HA as well.  She reports that heat really helps.  Asked if she should have an MRI, stating "I am worried about the future" I deferred to MD  (Pended)     Currently in Pain?  Yes  (Pended)     Pain Score  7   (Pended)     Pain Location  Neck  (Pended)  and low back     Pain Orientation  Left  (Pended)     Pain Descriptors / Indicators  Aching  (Pended)     Pain Relieving Factors  heat is the best  (Pended)                       [facility] Adult PT Treatment/Exercise - 03/09/17 0001      Neck Exercises: Supine   Other Supine Exercise  supine head on ball cervical retraction      Lumbar Exercises: Stretches   Single Knee to Chest Stretch  3 reps;30 seconds    Lower Trunk Rotation  5 reps;10 seconds      Lumbar Exercises: Aerobic    UBE (Upper Arm Bike)  level 3 x 5 minutes      Lumbar Exercises: Seated   Other Seated Lumbar Exercises  on sit fit pelvic mobility and then stability exercises      Lumbar Exercises: Supine   Other Supine Lumbar Exercises  feet on ball K2C, trunk rotaiton, small bridges and isometric abdominals      Lumbar Exercises: Sidelying   Clam  20 reps;2 seconds cues needed to do correctly      Moist Heat Therapy   Number Minutes Moist Heat  15 Minutes    Moist Heat Location  Cervical;Lumbar Spine               PT Short Term Goals - 03/09/17 1346      PT SHORT TERM GOAL #1   Title  She will be independent with intial HEP    Status  On-going        PT Long Term Goals -  02/26/17 1003      PT LONG TERM GOAL #1   Title  resume a walking program, or be able to use the gym at her apartment    Time  8    Period  Weeks    Status  New      PT LONG TERM GOAL #2   Title  She will report neck and back pain as intermittant    Time  8    Period  Weeks    Status  New      PT LONG TERM GOAL #3   Title  She will be able to lift and carry normal packages without assist due to decr pain (groceries)    Time  8    Period  Weeks    Status  New      PT LONG TERM GOAL #4   Title  Her neck motion will be WNL with 1/10 max pain    Time  8    Period  Weeks    Status  New      PT LONG TERM GOAL #5   Title  lumbar ROM will increase 50%    Time  8    Period  Weeks    Status  New            Plan - 03/09/17 1345    Clinical Impression Statement  Patient needs a lot of cues to do the exercises correctly and to be able to stay on task.  She does c/o a lot of pain with all exercises, she also c/o bladder issues, I asked her to see her MD about this    PT Next Visit Plan  if she tolerates we will need to add exercises for core strength    Consulted and Agree with Plan of Care  Patient       Patient will benefit from skilled therapeutic intervention in order to improve the  following deficits and impairments:  Postural dysfunction, Decreased strength, Decreased activity tolerance, Pain, Increased muscle spasms, Decreased range of motion, Improper body mechanics, Impaired flexibility  Visit Diagnosis: Acute bilateral low back pain with bilateral sciatica  Cervicalgia  Abnormal posture  Other muscle spasm  Acute pain of left shoulder     Problem List Patient Active Problem List   Diagnosis Date Noted  . Fibromyalgia 10/25/2012  . LLQ abdominal pain 09/08/2012  . Pelvic pain in female 10/02/2011  . Oligomenorrhea 05/20/2011  . VITAMIN D DEFICIENCY 02/15/2009  . OBESITY 06/07/2008  . ANEMIA 04/19/2008  . 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 FATIGUE 04/18/2008  . SKIN RASH 09/21/2007  . BREAST PAIN, BILATERAL 08/22/2007  . CALCANEAL SPUR, RIGHT 08/22/2007  . HEADACHE 08/22/2007  . CALLUSES, FEET, BILATERAL 08/08/2007  . FOOT PAIN, BILATERAL 08/08/2007  . CANDIDIASIS 07/18/2007  . LUMBAGO 07/18/2007  . HELICOBACTER PYLORI GASTRITIS, HX OF 05/30/2007  . POSITIVE PPD 05/18/2007    [name], PT 03/09/2017, 1:47 PM  [address] [place], [place], [address] Phone: [phone]   Fax:  [phone]  Name: Ermalinda Joubert MRN: [id number] Date of Birth: 12/16/1970

## 2017-03-11 ENCOUNTER — Ambulatory Visit: Payer: Medicaid Other | Admitting: Physical Therapy

## 2017-03-12 ENCOUNTER — Telehealth: Payer: Self-pay | Admitting: Gastroenterology

## 2017-03-15 NOTE — Telephone Encounter (Signed)
Called [facility] at 1 [phone] and talked with Rosendo Gros -she stated that [place] and this class of medications doesn't need a PA because they are not covered by insurance. Ref # [id number].  Dr Ardis Hughs please advise.

## 2017-03-16 ENCOUNTER — Encounter: Payer: Self-pay | Admitting: Physical Therapy

## 2017-03-16 ENCOUNTER — Ambulatory Visit: Payer: Medicaid Other | Admitting: Physical Therapy

## 2017-03-16 ENCOUNTER — Ambulatory Visit: Payer: Medicaid Other

## 2017-03-16 DIAGNOSIS — M5442 Lumbago with sciatica, left side: Secondary | ICD-10-CM | POA: Diagnosis not present

## 2017-03-16 DIAGNOSIS — M62838 Other muscle spasm: Secondary | ICD-10-CM

## 2017-03-16 DIAGNOSIS — R293 Abnormal posture: Secondary | ICD-10-CM

## 2017-03-16 DIAGNOSIS — M25512 Pain in left shoulder: Secondary | ICD-10-CM

## 2017-03-16 DIAGNOSIS — M542 Cervicalgia: Secondary | ICD-10-CM

## 2017-03-16 DIAGNOSIS — M5441 Lumbago with sciatica, right side: Secondary | ICD-10-CM
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
## 2017-03-17 ENCOUNTER — Ambulatory Visit: Payer: Medicaid Other

## 2017-03-17 ENCOUNTER — Ambulatory Visit: Payer: Medicaid Other | Admitting: Internal Medicine

## 2017-03-17 ENCOUNTER — Other Ambulatory Visit: Payer: Self-pay

## 2017-03-17 ENCOUNTER — Encounter: Payer: Self-pay | Admitting: Internal Medicine

## 2017-03-17 VITALS — BP 122/70 | HR 76 | Temp 97.4°F | Ht 66.0 in | Wt 186.6 lb

## 2017-03-17 DIAGNOSIS — E1165 Type 2 diabetes mellitus with hyperglycemia: Secondary | ICD-10-CM | POA: Diagnosis present

## 2017-03-17 DIAGNOSIS — F329 Major depressive disorder, single episode, unspecified: Secondary | ICD-10-CM | POA: Diagnosis not present

## 2017-03-17 DIAGNOSIS — R61 Generalized hyperhidrosis: Secondary | ICD-10-CM | POA: Diagnosis not present

## 2017-03-17 DIAGNOSIS — R202 Paresthesia of skin: Secondary | ICD-10-CM | POA: Diagnosis not present

## 2017-03-17 DIAGNOSIS — R200 Anesthesia of skin: Secondary | ICD-10-CM | POA: Diagnosis not present

## 2017-03-17 DIAGNOSIS — Z79899 Other long term (current) drug therapy: Secondary | ICD-10-CM | POA: Diagnosis not present

## 2017-03-17 DIAGNOSIS — R739 Hyperglycemia, unspecified: Secondary | ICD-10-CM

## 2017-03-17 DIAGNOSIS — R5383 Other fatigue: Secondary | ICD-10-CM | POA: Diagnosis not present

## 2017-03-17 DIAGNOSIS — E559 Vitamin D deficiency, unspecified: Secondary | ICD-10-CM

## 2017-03-17 DIAGNOSIS — M797 Fibromyalgia: Secondary | ICD-10-CM | POA: Diagnosis not present

## 2017-03-17 HISTORY — DX: Hyperglycemia, unspecified: R73.9

## 2017-03-17 LAB — GLUCOSE, CAPILLARY: Glucose-Capillary: 118 mg/dL — ABNORMAL HIGH (ref 65–99)

## 2017-03-17 LAB — POCT GLYCOSYLATED HEMOGLOBIN (HGB A1C): HEMOGLOBIN A1C: 5.5

## 2017-03-17 MED ORDER — DULOXETINE HCL 60 MG PO CPEP
60.0000 mg | ORAL_CAPSULE | Freq: Every day | ORAL | 0 refills | Status: DC
Start: 2017-03-17 — End: 2017-05-21

## 2017-03-17 NOTE — Assessment & Plan Note (Signed)
The patient continues to state that she has upper lumbar back pain that is 8 out of 10 intensity, sharp in nature, constantly present, and radiating down bilateral legs.  The patient states she has not had any recent trauma. Imaging of cervical, thoracic, lumbosacral spines are unremarkable.  She was started on duloxetine 30 mg daily on 02/22/2017 which she states has somewhat alleviated the pain.  She has also been using gabapentin 900 mg nightly and Lexapro 5 mg nightly.  Examination the patient also has an several other areas including neck, DIP, PIP joints, wrists, and bilateral knees.  The patient states that she has pain when she stands for greater than 15 minutes or sits for greater than 30 minutes.  Assessment Will evaluate for other causes of generalized pain and weakness by ordering TSH, anti-Ro and La antibodies, and vitamin D.  Plan Increased duloxetine from 30 to 60mg  daily

## 2017-03-17 NOTE — Patient Instructions (Addendum)
It was a pleasure to see you today Kristin Joseph. Please make the following changes:  -Please increase duloxetine to 60mg  daily -Continue doing yoga and physical exercise  If you have any questions or concerns, please call our clinic at [phone] between 9am-5pm and after hours call [phone] and ask for the internal medicine resident on call. If you feel you are having a medical emergency please call 911.   Thank you, we look forward to help you remain healthy!  Lars Mage, MD Internal Medicine PGY1   Myofascial Pain Syndrome and Fibromyalgia Myofascial pain syndrome and fibromyalgia are both pain disorders. This pain may be felt mainly in your muscles.  Myofascial pain syndrome: ? Always has trigger points or tender points in the muscle that will cause pain when pressed. The pain may come and go. ? Usually affects your neck, upper back, and shoulder areas. The pain often radiates into your arms and hands.  Fibromyalgia: ? Has muscle pains and tenderness that come and go. ? Is often associated with fatigue and sleep disturbances. ? Has trigger points. ? Tends to be long-lasting (chronic), but is not life-threatening.  Fibromyalgia and myofascial pain are not the same. However, they often occur together. If you have both conditions, each can make the other worse. Both are common and can cause enough pain and fatigue to make day-to-day activities difficult. What are the causes? The exact causes of fibromyalgia and myofascial pain are not known. People with certain gene types may be more likely to develop fibromyalgia. Some factors can be triggers for both conditions, such as:  Spine disorders.  Arthritis.  Severe injury (trauma) and other physical stressors.  Being under a lot of stress.  A medical illness.  What are the signs or symptoms? Fibromyalgia The main symptom of fibromyalgia is widespread pain and tenderness in your muscles. This can vary over time. Pain is  sometimes described as stabbing, shooting, or burning. You may have tingling or numbness, too. You may also have sleep problems and fatigue. You may wake up feeling tired and groggy (fibro fog). Other symptoms may include:  Bowel and bladder problems.  Headaches.  Visual problems.  Problems with odors and noises.  Depression or mood changes.  Painful menstrual periods (dysmenorrhea).  Dry skin or eyes.  Myofascial pain syndrome Symptoms of myofascial pain syndrome include:  Tight, ropy bands of muscle.  Uncomfortable sensations in muscular areas, such as: ? Aching. ? Cramping. ? Burning. ? Numbness. ? Tingling. ? Muscle weakness.  Trouble moving certain muscles freely (range of motion).  How is this diagnosed? There are no specific tests to diagnose fibromyalgia or myofascial pain syndrome. Both can be hard to diagnose because their symptoms are common in many other conditions. Your health care provider may suspect one or both of these conditions based on your symptoms and medical history. Your health care provider will also do a physical exam. The key to diagnosing fibromyalgia is having pain, fatigue, and other symptoms for more than three months that cannot be explained by another condition. The key to diagnosing myofascial pain syndrome is finding trigger points in muscles that are tender and cause pain elsewhere in your body (referred pain). How is this treated? Treating fibromyalgia and myofascial pain often requires a team of health care providers. This usually starts with your primary provider and a physical therapist. You may also find it helpful to work with alternative health care providers, such as massage therapists or acupuncturists. Treatment for fibromyalgia may include  medicines. This may include nonsteroidal anti-inflammatory drugs (NSAIDs), along with other medicines. Treatment for myofascial pain may also include:  NSAIDs.  Cooling and stretching of  muscles.  Trigger point injections.  Sound wave (ultrasound) treatments to stimulate muscles.  Follow these instructions at home:  Take medicines only as directed by your health care provider.  Exercise as directed by your health care provider or physical therapist.  Try to avoid stressful situations.  Practice relaxation techniques to control your stress. You may want to try: ? Biofeedback. ? Visual imagery. ? Hypnosis. ? Muscle relaxation. ? Yoga. ? Meditation.  Talk to your health care provider about alternative treatments, such as acupuncture or massage treatment.  Maintain a healthy lifestyle. This includes eating a healthy diet and getting enough sleep.  Consider joining a support group.  Do not do activities that stress or strain your muscles. That includes repetitive motions and heavy lifting. Where to find more information:  National Fibromyalgia Association: www.fmaware.[employer]: www.arthritis.org  American Chronic Pain Association: OEMDeals.dk Contact a health care provider if:  You have new symptoms.  Your symptoms get worse.  You have side effects from your medicines.  You have trouble sleeping.  Your condition is causing depression or anxiety. This information is not intended to replace advice given to you by your health care provider. Make sure you discuss any questions you have with your health care provider. Document Released: 01/05/2005 Document Revised: 06/13/2015 Document Reviewed: 10/11/2013 Elsevier Interactive Patient Education  [employer].

## 2017-03-17 NOTE — Assessment & Plan Note (Signed)
The patient had a mildly elevated blood glucose reading of 124 reported on CMP 02/10/2017.  The patient's A1c during this visit was 5.5 and random glucose was 118 which does not displace the patient at a level to diagnose diabetes.

## 2017-03-17 NOTE — Progress Notes (Signed)
   CC: Diabetes Workup  HPI:  [name] is a 47 y.o. female with fibromyalgia, depression, vitamin D deficiency who presents for diabetes workup. Please see problem based charting for evaluation, assessment, and plan.  Past Medical History:  Diagnosis Date  . Back pain   . Depression   . Fibromyalgia 10/25/2012  . Gastritis 06/28/2012   Mild  . Obesity   . Pelvic pain   . Stress headaches   . Vitamin D deficiency    Review of Systems:  Sweating, lethargy, numbness and tingling in extremities  Physical Exam:  Vitals:   03/17/17 1349  BP: 122/70  Pulse: 76  Temp: (!) 97.4 F (36.3 C)  TempSrc: Oral  SpO2: 99%  Weight: 186 lb 9.6 oz (84.6 kg)  Height: 5\' 6"  (1.676 m)   Physical Exam  Constitutional: She appears well-developed and well-nourished. No distress.  HENT:  Head: Normocephalic and atraumatic.  Eyes: Conjunctivae are normal.  Cardiovascular: Normal rate, regular rhythm and normal heart sounds.  Respiratory: Effort normal and breath sounds normal. No respiratory distress. She has no wheezes.  GI: Soft. Bowel sounds are normal. She exhibits no distension. There is no tenderness.  Musculoskeletal: She exhibits tenderness. She exhibits no edema.  Neurological: She is alert.  Skin: She is not diaphoretic. No erythema.  Psychiatric: She has a normal mood and affect. Her behavior is normal. Judgment and thought content normal.     Assessment & Plan:   See Encounters Tab for problem based charting.  Hyperglycemia The patient had a mildly elevated blood glucose reading of 124 reported on CMP 02/10/2017.  The patient's A1c during this visit was 5.5 and random glucose was 118 which does not displace the patient at a level to diagnose diabetes.  Fibromyalgia The patient continues to state that she has upper lumbar back pain that is 8 out of 10 intensity, sharp in nature, constantly present, and radiating down bilateral legs.  The patient states she has not  had any recent trauma. Imaging of cervical, thoracic, lumbosacral spines are unremarkable.  She was started on duloxetine 30 mg daily on 02/22/2017 which she states has somewhat alleviated the pain.  She has also been using gabapentin 900 mg nightly and Lexapro 5 mg nightly.  Examination the patient also has an several other areas including neck, DIP, PIP joints, wrists, and bilateral knees.  The patient states that she has pain when she stands for greater than 15 minutes or sits for greater than 30 minutes.  Assessment Will evaluate for other causes of generalized pain and weakness by ordering TSH, anti-Ro and La antibodies, and vitamin D.  Plan Increased duloxetine from 30 to 60mg  daily  Patient discussed with Dr. Dareen Piano

## 2017-03-18 LAB — SJOGREN'S SYNDROME ANTIBODS(SSA + SSB): ENA SSA (RO) Ab: <0.2 AI (ref 0.0–0.9)

## 2017-03-18 LAB — VITAMIN D 25 HYDROXY (VIT D DEFICIENCY, FRACTURES): VIT D 25 HYDROXY: 31.4 ng/mL (ref 30.0–100.0)

## 2017-03-18 LAB — TSH: TSH: 1.93 u[IU]/mL (ref 0.450–4.500)

## 2017-03-18 NOTE — Telephone Encounter (Signed)
Called the patient and left a message on voicemail to return my call. 

## 2017-03-18 NOTE — Telephone Encounter (Signed)
So how much will she pay out of pocket and is that acceptable to her?

## 2017-03-19 ENCOUNTER — Ambulatory Visit: Payer: Medicaid Other | Attending: Internal Medicine | Admitting: Physical Therapy

## 2017-03-19 ENCOUNTER — Encounter: Payer: Self-pay | Admitting: Physical Therapy

## 2017-03-19 DIAGNOSIS — M5441 Lumbago with sciatica, right side: Secondary | ICD-10-CM | POA: Insufficient documentation

## 2017-03-19 DIAGNOSIS — R293 Abnormal posture: Secondary | ICD-10-CM | POA: Diagnosis present

## 2017-03-19 DIAGNOSIS — M25512 Pain in left shoulder: Secondary | ICD-10-CM | POA: Insufficient documentation

## 2017-03-19 DIAGNOSIS — M5442 Lumbago with sciatica, left side: Secondary | ICD-10-CM | POA: Diagnosis present

## 2017-03-19 DIAGNOSIS — M62838 Other muscle spasm: Secondary | ICD-10-CM | POA: Insufficient documentation

## 2017-03-19 DIAGNOSIS — M542 Cervicalgia: Secondary | ICD-10-CM | POA: Insufficient documentation

## 2017-03-19 NOTE — Therapy (Signed)
[address] [place], [place], [address] Phone: [phone]   Fax:  [phone]  Physical Therapy Treatment  Patient Details  Name: Kristin Joseph MRN: [id number] Date of Birth: October 07, 1970 Referring Provider: Kevan Ny   Encounter Date: 03/19/2017  PT End of Session - 03/19/17 1027    Visit Number  4    Date for PT Re-Evaluation  04/26/17    PT Start Time  0958 28 minutes late    PT Stop Time  1038    PT Time Calculation (min)  40 min    Activity Tolerance  Patient limited by pain    Behavior During Therapy  [facility] for tasks assessed/performed       Past Medical History:  Diagnosis Date  . Back pain   . Depression   . Fibromyalgia 10/25/2012  . Gastritis 06/28/2012   Mild  . Obesity   . Pelvic pain   . Stress headaches   . Vitamin D deficiency     Past Surgical History:  Procedure Laterality Date  . APPENDECTOMY    . FOOT SURGERY Bilateral    Plantar fasciitis  . THYROIDECTOMY, PARTIAL     pt denies    There were no vitals filed for this visit.  Subjective Assessment - 03/19/17 1006    Subjective  Patient 28 minutes late.  I explained to her that the only reason I was going to see her today is that I have the assistance of a student.  I explained to her that if she is 5 minutes late for any appointments in the future that we will refuse to see her since this is a habit, she usually has many excuses but I told her as a show of respect to me and to my other patients that her timeliness is a must.  She apologized and reported that she will be early for appointments in the future    Currently in Pain?  Yes    Pain Score  7     Pain Location  Back    Aggravating Factors   activities    Pain Relieving Factors  heat                      [facility] Adult PT Treatment/Exercise - 03/19/17 0001      Lumbar Exercises: Aerobic   Tread Mill  NuStep Level 4 x 6 minutes    UBE (Upper Arm  Bike)  level 3 x 5 minutes      Lumbar Exercises: Machines for Strengthening   Other Lumbar Machine Exercise  seated row and lats 15# 2x10      Lumbar Exercises: Seated   Other Seated Lumbar Exercises  on sit fit pelvic mobility and then stability exercises      Lumbar Exercises: Supine   Other Supine Lumbar Exercises  feet on ball K2C, trunk rotaiton, small bridges and isometric abdominals      Lumbar Exercises: Sidelying   Clam  20 reps;2 seconds      Lumbar Exercises: Quadruped   Single Arm Raise  5 reps    Straight Leg Raise  5 reps    Opposite Arm/Leg Raise  5 reps      Moist Heat Therapy   Number Minutes Moist Heat  15 Minutes    Moist Heat Location  Cervical;Lumbar Spine               PT Short Term Goals -  03/16/17 1527      PT SHORT TERM GOAL #1   Title  She will be independent with intial HEP    Status  Partially Met        PT Long Term Goals - 03/19/17 1034      PT LONG TERM GOAL #1   Title  resume a walking program, or be able to use the gym at her apartment    Status  On-going            Plan - 03/19/17 1030    Clinical Impression Statement  Patient 28 minutes late.  She did the exercies with minimal c/o pain    PT Next Visit Plan  continue to add exercises as tolerated    Consulted and Agree with Plan of Care  Patient       Patient will benefit from skilled therapeutic intervention in order to improve the following deficits and impairments:  Postural dysfunction, Decreased strength, Decreased activity tolerance, Pain, Increased muscle spasms, Decreased range of motion, Improper body mechanics, Impaired flexibility  Visit Diagnosis: Acute bilateral low back pain with bilateral sciatica  Cervicalgia  Abnormal posture  Other muscle spasm  Acute pain of left shoulder     Problem List Patient Active Problem List   Diagnosis Date Noted  . Hyperglycemia 03/17/2017  . Fibromyalgia 10/25/2012  . LLQ abdominal pain 09/08/2012  .  Pelvic pain in female 10/02/2011  . Oligomenorrhea 05/20/2011  . VITAMIN D DEFICIENCY 02/15/2009  . OBESITY 06/07/2008  . ANEMIA 04/19/2008  . 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 FATIGUE 04/18/2008  . SKIN RASH 09/21/2007  . BREAST PAIN, BILATERAL 08/22/2007  . CALCANEAL SPUR, RIGHT 08/22/2007  . HEADACHE 08/22/2007  . CALLUSES, FEET, BILATERAL 08/08/2007  . FOOT PAIN, BILATERAL 08/08/2007  . CANDIDIASIS 07/18/2007  . LUMBAGO 07/18/2007  . HELICOBACTER PYLORI GASTRITIS, HX OF 05/30/2007  . POSITIVE PPD 05/18/2007    [name], PT 03/19/2017, 10:34 AM  [address] [place], [place], [address] Phone: [phone]   Fax:  [phone]  Name: Kristin Joseph MRN: [id number] Date of Birth: Jun 29, 1970

## 2017-03-23 ENCOUNTER — Ambulatory Visit: Payer: Medicaid Other | Admitting: Physical Therapy

## 2017-03-23 ENCOUNTER — Encounter: Payer: Self-pay | Admitting: Physical Therapy

## 2017-03-23 DIAGNOSIS — M5442 Lumbago with sciatica, left side: Principal | ICD-10-CM

## 2017-03-23 DIAGNOSIS — R293 Abnormal posture: Secondary | ICD-10-CM

## 2017-03-23 DIAGNOSIS — M542 Cervicalgia: Secondary | ICD-10-CM

## 2017-03-23 DIAGNOSIS — M5441 Lumbago with sciatica, right side: Secondary | ICD-10-CM

## 2017-03-23 NOTE — Therapy (Signed)
[place], [place], [address] Phone: [phone]   Fax:  [phone]  Physical Therapy Treatment  Patient Details  Name: Kristin Joseph MRN: [id number] Date of Birth: [birth year] Referring Provider: Kevan Ny   Encounter Date: 03/23/2017  PT End of Session - 03/23/17 1048    Visit Number  5    Date for PT Re-Evaluation  04/26/17    PT Start Time  1018    PT Stop Time  1104    PT Time Calculation (min)  46 min    Activity Tolerance  Patient tolerated treatment well;Patient limited by pain    Behavior During Therapy  [facility] for tasks assessed/performed       Past Medical History:  Diagnosis Date  . Back pain   . Depression   . Fibromyalgia 10/25/2012  . Gastritis 06/28/2012   Mild  . Obesity   . Pelvic pain   . Stress headaches   . Vitamin D deficiency     Past Surgical History:  Procedure Laterality Date  . APPENDECTOMY    . FOOT SURGERY Bilateral    Plantar fasciitis  . THYROIDECTOMY, PARTIAL     pt denies    There were no vitals filed for this visit.  Subjective Assessment - 03/23/17 1021    Subjective  Pt reports  pain in her head on L side, R side of her neck and mid back.    Currently in Pain?  Yes    Pain Score  7     Pain Location  -- neck and back, head 6/10                      [facility] Adult PT Treatment/Exercise - 03/23/17 0001      Lumbar Exercises: Aerobic   Tread Mill  NuStep Level 4 x 5 minutes    UBE (Upper Arm Bike)  level 3 x 6 minutes      Lumbar Exercises: Machines for Strengthening   Cybex Knee Extension  5lb 2x10     Cybex Knee Flexion  20lb 2x10     Other Lumbar Machine Exercise  seated row and lats 15# 2x10      Moist Heat Therapy   Number Minutes Moist Heat  15 Minutes    Moist Heat Location  Cervical;Lumbar Spine               PT Short Term Goals - 03/16/17 1527      PT SHORT TERM GOAL #1   Title  She will be  independent with intial HEP    Status  Partially Met        PT Long Term Goals - 03/19/17 1034      PT LONG TERM GOAL #1   Title  resume a walking program, or be able to use the gym at her apartment    Status  On-going            Plan - 03/23/17 1050    Clinical Impression Statement  Pt tolerated more exercise interventions, bit more so wanting heat on her neck and low back. No reports of increase pain reported during today's exercises.     Rehab Potential  Fair    PT Frequency  2x / week    PT Duration  8 weeks    PT Treatment/Interventions  Patient/family education;Passive range of motion;Manual techniques;Therapeutic exercise;Therapeutic activities;Electrical Stimulation;Moist Heat;Dry needling    PT Next  Visit Plan  continue to add exercises as tolerated       Patient will benefit from skilled therapeutic intervention in order to improve the following deficits and impairments:  Postural dysfunction, Decreased strength, Decreased activity tolerance, Pain, Increased muscle spasms, Decreased range of motion, Improper body mechanics, Impaired flexibility  Visit Diagnosis: Acute bilateral low back pain with bilateral sciatica  Cervicalgia  Abnormal posture     Problem List Patient Active Problem List   Diagnosis Date Noted  . Hyperglycemia 03/17/2017  . Fibromyalgia 10/25/2012  . LLQ abdominal pain 09/08/2012  . Pelvic pain in female 10/02/2011  . Oligomenorrhea 05/20/2011  . VITAMIN D DEFICIENCY 02/15/2009  . OBESITY 06/07/2008  . ANEMIA 04/19/2008  . 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 FATIGUE 04/18/2008  . SKIN RASH 09/21/2007  . BREAST PAIN, BILATERAL 08/22/2007  . CALCANEAL SPUR, RIGHT 08/22/2007  . HEADACHE 08/22/2007  . CALLUSES, FEET, BILATERAL 08/08/2007  . FOOT PAIN, BILATERAL 08/08/2007  . CANDIDIASIS 07/18/2007  . LUMBAGO 07/18/2007  . HELICOBACTER PYLORI GASTRITIS, HX OF 05/30/2007  . POSITIVE PPD 05/18/2007    Scot Jun, PTA 03/23/2017,  10:53 AM  [place], [place], [address] Phone: [phone]   Fax:  [phone]  Name: Kristin Joseph MRN: [id number] Date of Birth: January 03, 1971

## 2017-03-23 NOTE — Telephone Encounter (Signed)
Left message on machine to call back  

## 2017-03-24 ENCOUNTER — Other Ambulatory Visit: Payer: Self-pay | Admitting: Internal Medicine

## 2017-03-24 DIAGNOSIS — Z1231 Encounter for screening mammogram for malignant neoplasm of breast: Secondary | ICD-10-CM

## 2017-03-26 ENCOUNTER — Ambulatory Visit: Payer: Medicaid Other | Admitting: Physical Therapy

## 2017-03-26 ENCOUNTER — Encounter: Payer: Self-pay | Admitting: Physical Therapy

## 2017-03-26 DIAGNOSIS — M542 Cervicalgia: Secondary | ICD-10-CM

## 2017-03-26 DIAGNOSIS — M5442 Lumbago with sciatica, left side: Secondary | ICD-10-CM

## 2017-03-26 DIAGNOSIS — R293 Abnormal posture: Secondary | ICD-10-CM

## 2017-03-26 DIAGNOSIS — M62838 Other muscle spasm: Secondary | ICD-10-CM

## 2017-03-26 DIAGNOSIS — M5441 Lumbago with sciatica, right side: Secondary | ICD-10-CM

## 2017-03-26 NOTE — Telephone Encounter (Signed)
Me or Patty was unable to understand patient. Patty lost contact. Left a message for pt to cal back. Will wait on call/message from pt.

## 2017-03-26 NOTE — Therapy (Signed)
[place], [place], [address] Phone: [phone]   Fax:  [phone]  Physical Therapy Treatment  Patient Details  Name: Kristin Joseph MRN: [id number] Date of Birth: 1970/03/18 Referring Provider: Kevan Ny   Encounter Date: 03/26/2017  PT End of Session - 03/26/17 1008    Visit Number  6    Number of Visits  12    Date for PT Re-Evaluation  04/26/17    Authorization Type  Medicaid    PT Start Time  0933    PT Stop Time  1018    PT Time Calculation (min)  45 min    Activity Tolerance  Patient tolerated treatment well;Patient limited by pain    Behavior During Therapy  [facility] for tasks assessed/performed       Past Medical History:  Diagnosis Date  . Back pain   . Depression   . Fibromyalgia 10/25/2012  . Gastritis 06/28/2012   Mild  . Obesity   . Pelvic pain   . Stress headaches   . Vitamin D deficiency     Past Surgical History:  Procedure Laterality Date  . APPENDECTOMY    . FOOT SURGERY Bilateral    Plantar fasciitis  . THYROIDECTOMY, PARTIAL     pt denies    There were no vitals filed for this visit.  Subjective Assessment - 03/26/17 0936    Subjective  Pt stated that sh still has headache on her L side    Currently in Pain?  Yes    Pain Score  7     Pain Location  -- head and neck                      [facility] Adult PT Treatment/Exercise - 03/26/17 0001      Lumbar Exercises: Aerobic   Tread Mill  NuStep Level 4 x 5 minutes    UBE (Upper Arm Bike)  level 3 x 6 minutes      Lumbar Exercises: Machines for Strengthening   Cybex Knee Extension  5lb 2x10     Cybex Knee Flexion  20lb 2x10       Lumbar Exercises: Standing   Row  Theraband;20 reps;Both    Shoulder Extension  Theraband;20 reps;Both      Lumbar Exercises: Seated   Sit to Stand  10 reps holding yellpo wball     Other Seated Lumbar Exercises  Sit to stand with OHP x5      Moist Heat  Therapy   Number Minutes Moist Heat  10 Minutes    Moist Heat Location  Cervical;Lumbar Spine               PT Short Term Goals - 03/16/17 1527      PT SHORT TERM GOAL #1   Title  She will be independent with intial HEP    Status  Partially Met        PT Long Term Goals - 03/19/17 1034      PT LONG TERM GOAL #1   Title  resume a walking program, or be able to use the gym at her apartment    Status  On-going            Plan - 03/26/17 1009    Clinical Impression Statement  pt does not like to do the exercises, only wants heat. Expressed to pt that's we needed to do the exercises to help increase  flexibility and strengthen her body. Min effort given to exercises interventions and requires constant cues to do them right. Pt does express that she has pain and welling in her legs at night ans wanted me to put that information in the computer.     Rehab Potential  Fair    PT Frequency  2x / week    PT Duration  8 weeks    PT Treatment/Interventions  Patient/family education;Passive range of motion;Manual techniques;Therapeutic exercise;Therapeutic activities;Electrical Stimulation;Moist Heat;Dry needling    PT Next Visit Plan  continue to add exercises as tolerated, pt does not like to exercises       Patient will benefit from skilled therapeutic intervention in order to improve the following deficits and impairments:  Postural dysfunction, Decreased strength, Decreased activity tolerance, Pain, Increased muscle spasms, Decreased range of motion, Improper body mechanics, Impaired flexibility  Visit Diagnosis: Cervicalgia  Acute bilateral low back pain with bilateral sciatica  Abnormal posture  Other muscle spasm     Problem List Patient Active Problem List   Diagnosis Date Noted  . Hyperglycemia 03/17/2017  . Fibromyalgia 10/25/2012  . LLQ abdominal pain 09/08/2012  . Pelvic pain in female 10/02/2011  . Oligomenorrhea 05/20/2011  . VITAMIN D DEFICIENCY  02/15/2009  . OBESITY 06/07/2008  . ANEMIA 04/19/2008  . 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 FATIGUE 04/18/2008  . SKIN RASH 09/21/2007  . BREAST PAIN, BILATERAL 08/22/2007  . CALCANEAL SPUR, RIGHT 08/22/2007  . HEADACHE 08/22/2007  . CALLUSES, FEET, BILATERAL 08/08/2007  . FOOT PAIN, BILATERAL 08/08/2007  . CANDIDIASIS 07/18/2007  . LUMBAGO 07/18/2007  . HELICOBACTER PYLORI GASTRITIS, HX OF 05/30/2007  . POSITIVE PPD 05/18/2007    Scot Jun, PTA 03/26/2017, 10:12 AM  [place], [place], [address] Phone: [phone]   Fax:  [phone]  Name: Kristin Joseph MRN: [id number] Date of Birth: 1970/10/03

## 2017-03-29 ENCOUNTER — Telehealth: Payer: Self-pay | Admitting: Gastroenterology

## 2017-03-29 NOTE — Progress Notes (Signed)
Internal Medicine Clinic Attending  Case discussed with Dr. Chundi at the time of the visit.  We reviewed the resident's history and exam and pertinent patient test results.  I agree with the assessment, diagnosis, and plan of care documented in the resident's note. 

## 2017-03-30 ENCOUNTER — Encounter: Payer: Self-pay | Admitting: Physical Therapy

## 2017-03-30 ENCOUNTER — Ambulatory Visit: Payer: Medicaid Other | Admitting: Physical Therapy

## 2017-03-30 DIAGNOSIS — M25512 Pain in left shoulder: Secondary | ICD-10-CM

## 2017-03-30 DIAGNOSIS — M5442 Lumbago with sciatica, left side: Secondary | ICD-10-CM

## 2017-03-30 DIAGNOSIS — M5441 Lumbago with sciatica, right side: Secondary | ICD-10-CM

## 2017-03-30 DIAGNOSIS — R293 Abnormal posture: Secondary | ICD-10-CM

## 2017-03-30 DIAGNOSIS — M62838 Other muscle spasm: Secondary | ICD-10-CM

## 2017-03-30 DIAGNOSIS — M542 Cervicalgia: Secondary | ICD-10-CM

## 2017-03-30 NOTE — Therapy (Signed)
[facility] [place], [place], [address] Phone: [phone]   Fax:  [phone]  Physical Therapy Treatment  Patient Details  Name: Kristin Joseph MRN: [id number] Date of Birth: [birth year] Referring Provider: Kevan Ny   Encounter Date: 03/30/2017  PT End of Session - 03/30/17 1258    Visit Number  7    PT Start Time  8832    PT Stop Time  1340    PT Time Calculation (min)  57 min    Equipment Utilized During Treatment  Other (comment)    Activity Tolerance  Patient tolerated treatment well;Patient limited by pain    Behavior During Therapy  [facility] for tasks assessed/performed       Past Medical History:  Diagnosis Date  . Back pain   . Depression   . Fibromyalgia 10/25/2012  . Gastritis 06/28/2012   Mild  . Obesity   . Pelvic pain   . Stress headaches   . Vitamin D deficiency     Past Surgical History:  Procedure Laterality Date  . APPENDECTOMY    . FOOT SURGERY Bilateral    Plantar fasciitis  . THYROIDECTOMY, PARTIAL     pt denies    There were no vitals filed for this visit.  Subjective Assessment - 03/30/17 1252    Subjective  Patient reported that after the last treatment she felt very good for 2 days    Currently in Pain?  Yes    Pain Score  5     Pain Location  Back    Aggravating Factors   standing    Pain Relieving Factors  heat                      [facility] Adult PT Treatment/Exercise - 03/30/17 0001      Ambulation/Gait   Gait Comments  around building good pace, trying to do good steps      Lumbar Exercises: Stretches   Passive Hamstring Stretch  3 reps;20 seconds    Piriformis Stretch  3 reps;20 seconds      Lumbar Exercises: Aerobic   Tread Mill  NuStep Level 5 x 6 minutes    UBE (Upper Arm Bike)  level 3 x 6 minutes      Lumbar Exercises: Machines for Strengthening   Cybex Knee Extension  5lb 2x10     Cybex Knee Flexion  20lb 2x10       Lumbar  Exercises: Seated   Sit to Stand  10 reps      Lumbar Exercises: Supine   Other Supine Lumbar Exercises  feet on ball K2C, trunk rotaiton, small bridges and isometric abdominals      Lumbar Exercises: Sidelying   Clam  20 reps;2 seconds      Moist Heat Therapy   Number Minutes Moist Heat  20 Minutes    Moist Heat Location  Cervical;Lumbar Spine               PT Short Term Goals - 03/16/17 1527      PT SHORT TERM GOAL #1   Title  She will be independent with intial HEP    Status  Partially Met        PT Long Term Goals - 03/19/17 1034      PT LONG TERM GOAL #1   Title  resume a walking program, or be able to use the gym at her apartment  Status  On-going            Plan - 03/30/17 1259    Clinical Impression Statement  Patient seems to be moving better, still some gaurding especially with her shoulders and neck.  Does limp at times with c/o pain in the low back    PT Next Visit Plan  add exercoises as tolerated    Consulted and Agree with Plan of Care  Patient       Patient will benefit from skilled therapeutic intervention in order to improve the following deficits and impairments:  Postural dysfunction, Decreased strength, Decreased activity tolerance, Pain, Increased muscle spasms, Decreased range of motion, Improper body mechanics, Impaired flexibility  Visit Diagnosis: Cervicalgia  Acute bilateral low back pain with bilateral sciatica  Abnormal posture  Other muscle spasm  Acute pain of left shoulder     Problem List Patient Active Problem List   Diagnosis Date Noted  . Hyperglycemia 03/17/2017  . Fibromyalgia 10/25/2012  . LLQ abdominal pain 09/08/2012  . Pelvic pain in female 10/02/2011  . Oligomenorrhea 05/20/2011  . VITAMIN D DEFICIENCY 02/15/2009  . OBESITY 06/07/2008  . ANEMIA 04/19/2008  . 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 FATIGUE 04/18/2008  . SKIN RASH 09/21/2007  . BREAST PAIN, BILATERAL 08/22/2007  . CALCANEAL SPUR, RIGHT  08/22/2007  . HEADACHE 08/22/2007  . CALLUSES, FEET, BILATERAL 08/08/2007  . FOOT PAIN, BILATERAL 08/08/2007  . CANDIDIASIS 07/18/2007  . LUMBAGO 07/18/2007  . HELICOBACTER PYLORI GASTRITIS, HX OF 05/30/2007  . POSITIVE PPD 05/18/2007    [name], PT 03/30/2017, 1:12 PM  [facility] [address] [place], [place], [address] Phone: [phone]   Fax:  [phone]  Name: Kristin Joseph MRN: [id number] Date of Birth: 12-19-1970

## 2017-03-31 NOTE — Telephone Encounter (Signed)
Dr Ardis Hughs the pt is unable to afford levbid and insurance will not cover.  Please advise

## 2017-04-01 ENCOUNTER — Encounter: Payer: Self-pay | Admitting: Physical Therapy

## 2017-04-01 ENCOUNTER — Ambulatory Visit: Payer: Medicaid Other | Admitting: Physical Therapy

## 2017-04-01 DIAGNOSIS — M25512 Pain in left shoulder: Secondary | ICD-10-CM

## 2017-04-01 DIAGNOSIS — M542 Cervicalgia: Secondary | ICD-10-CM

## 2017-04-01 DIAGNOSIS — R293 Abnormal posture: Secondary | ICD-10-CM

## 2017-04-01 DIAGNOSIS — M5442 Lumbago with sciatica, left side: Secondary | ICD-10-CM

## 2017-04-01 DIAGNOSIS — M5441 Lumbago with sciatica, right side: Secondary | ICD-10-CM

## 2017-04-01 DIAGNOSIS — M62838 Other muscle spasm: Secondary | ICD-10-CM

## 2017-04-01 MED ORDER — DICYCLOMINE HCL 10 MG PO CAPS: 10.0000 mg | ORAL_CAPSULE | Freq: Three times a day (TID) | ORAL | 3 refills | Status: DC

## 2017-04-01 NOTE — Therapy (Signed)
[facility], [place], [address] Phone: [phone]   Fax:  [phone]  Physical Therapy Treatment  Patient Details  Name: Kristin Joseph MRN: [id number] Date of Birth: 02-03-1970 Referring Provider: Kevan Ny   Encounter Date: 04/01/2017  PT End of Session - 04/01/17 1654    Visit Number  8    Date for PT Re-Evaluation  04/26/17    PT Start Time  4010    PT Stop Time  1630    PT Time Calculation (min)  60 min    Activity Tolerance  Patient tolerated treatment well;Patient limited by pain    Behavior During Therapy  [facility] for tasks assessed/performed       Past Medical History:  Diagnosis Date  . Back pain   . Depression   . Fibromyalgia 10/25/2012  . Gastritis 06/28/2012   Mild  . Obesity   . Pelvic pain   . Stress headaches   . Vitamin D deficiency     Past Surgical History:  Procedure Laterality Date  . APPENDECTOMY    . FOOT SURGERY Bilateral    Plantar fasciitis  . THYROIDECTOMY, PARTIAL     pt denies    There were no vitals filed for this visit.  Subjective Assessment - 04/01/17 1628    Subjective  Patient again reporting good relief of pain with current treatment plan.  She has her special needs daughter with her so most of the treatment was in a room    Currently in Pain?  Yes    Pain Score  5     Pain Location  Back                      [facility] Adult PT Treatment/Exercise - 04/01/17 0001      Ambulation/Gait   Gait Comments  around building good pace, trying to do good steps      Lumbar Exercises: Stretches   Passive Hamstring Stretch  3 reps;20 seconds    Piriformis Stretch  3 reps;20 seconds      Lumbar Exercises: Supine   Clam  20 reps;2 seconds    Bent Knee Raise  20 reps    Straight Leg Raise  20 reps    Other Supine Lumbar Exercises  trunk rotation with green tband, weighted ball lift and overhead    Other Supine Lumbar Exercises  feet on  ball K2C, trunk rotaiton, small bridges and isometric abdominals      Lumbar Exercises: Sidelying   Clam  20 reps;2 seconds      Moist Heat Therapy   Number Minutes Moist Heat  15 Minutes    Moist Heat Location  Cervical;Lumbar Spine               PT Short Term Goals - 03/16/17 1527      PT SHORT TERM GOAL #1   Title  She will be independent with intial HEP    Status  Partially Met        PT Long Term Goals - 04/01/17 1655      PT LONG TERM GOAL #1   Title  resume a walking program, or be able to use the gym at her apartment    Status  Partially Met            Plan - 04/01/17 1654    Clinical Impression Statement  With special needs daughter here we had to do  most of the treatment in the room.  She is able to do with cues most exercises, she does c/o a lot of pain with sidelying and with clams, pain is in the lateral hips and the thighs    PT Next Visit Plan  continue to add core as tolerated    Consulted and Agree with Plan of Care  Patient       Patient will benefit from skilled therapeutic intervention in order to improve the following deficits and impairments:  Postural dysfunction, Decreased strength, Decreased activity tolerance, Pain, Increased muscle spasms, Decreased range of motion, Improper body mechanics, Impaired flexibility  Visit Diagnosis: Cervicalgia  Acute bilateral low back pain with bilateral sciatica  Abnormal posture  Other muscle spasm  Acute pain of left shoulder     Problem List Patient Active Problem List   Diagnosis Date Noted  . Hyperglycemia 03/17/2017  . Fibromyalgia 10/25/2012  . LLQ abdominal pain 09/08/2012  . Pelvic pain in female 10/02/2011  . Oligomenorrhea 05/20/2011  . VITAMIN D DEFICIENCY 02/15/2009  . OBESITY 06/07/2008  . ANEMIA 04/19/2008  . 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 FATIGUE 04/18/2008  . SKIN RASH 09/21/2007  . BREAST PAIN, BILATERAL 08/22/2007  . CALCANEAL SPUR, RIGHT 08/22/2007  . HEADACHE  08/22/2007  . CALLUSES, FEET, BILATERAL 08/08/2007  . FOOT PAIN, BILATERAL 08/08/2007  . CANDIDIASIS 07/18/2007  . LUMBAGO 07/18/2007  . HELICOBACTER PYLORI GASTRITIS, HX OF 05/30/2007  . POSITIVE PPD 05/18/2007    [name], PT 04/01/2017, 4:56 PM  [facility] [address] [place], [place], [address] Phone: [phone]   Fax:  [phone]  Name: Kimmie Berggren MRN: [id number] Date of Birth: [birth year]

## 2017-04-01 NOTE — Telephone Encounter (Signed)
The pt aware that the prescription has been sent to the pharmacy and she will call if this is also to expensive.

## 2017-04-01 NOTE — Telephone Encounter (Signed)
Ok, how about bentyl 10mg  capsule, take one capsule 2-3 times daily. Disp 90 with 3 refills.

## 2017-04-06 ENCOUNTER — Encounter: Payer: Self-pay | Admitting: Physical Therapy

## 2017-04-06 ENCOUNTER — Ambulatory Visit: Payer: Medicaid Other | Admitting: Physical Therapy

## 2017-04-06 DIAGNOSIS — R293 Abnormal posture: Secondary | ICD-10-CM

## 2017-04-06 DIAGNOSIS — M542 Cervicalgia: Secondary | ICD-10-CM

## 2017-04-06 DIAGNOSIS — M5442 Lumbago with sciatica, left side: Secondary | ICD-10-CM | POA: Diagnosis not present

## 2017-04-06 DIAGNOSIS — M5441 Lumbago with sciatica, right side: Secondary | ICD-10-CM

## 2017-04-06 DIAGNOSIS — M25512 Pain in left shoulder: Secondary | ICD-10-CM

## 2017-04-06 DIAGNOSIS — M62838 Other muscle spasm: Secondary | ICD-10-CM

## 2017-04-06 NOTE — Therapy (Signed)
[facility] [place], [place], [address] Phone: [phone]   Fax:  [phone]  Physical Therapy Treatment  Patient Details  Name: Kristin Joseph MRN: [id number] Date of Birth: March 15, 1970 Referring Provider: Kevan Ny   Encounter Date: 04/06/2017  PT End of Session - 04/06/17 1524    Visit Number  9    Date for PT Re-Evaluation  04/26/17    PT Start Time  1452 heat    PT Stop Time  1545    PT Time Calculation (min)  53 min    Activity Tolerance  Patient tolerated treatment well;Patient limited by pain    Behavior During Therapy  [facility] for tasks assessed/performed       Past Medical History:  Diagnosis Date  . Back pain   . Depression   . Fibromyalgia 10/25/2012  . Gastritis 06/28/2012   Mild  . Obesity   . Pelvic pain   . Stress headaches   . Vitamin D deficiency     Past Surgical History:  Procedure Laterality Date  . APPENDECTOMY    . FOOT SURGERY Bilateral    Plantar fasciitis  . THYROIDECTOMY, PARTIAL     pt denies    There were no vitals filed for this visit.  Subjective Assessment - 04/06/17 1458    Subjective  Patient reports not too bad, still hurting, she still has an limp and forward flexed trunk with walking    Currently in Pain?  Yes    Pain Score  4     Pain Location  Back         [facility] PT Assessment - 04/06/17 0001      AROM   Left Shoulder Flexion  120 Degrees    Left Shoulder ABduction  100 Degrees                  [facility] Adult PT Treatment/Exercise - 04/06/17 0001      Lumbar Exercises: Stretches   Passive Hamstring Stretch  3 reps;20 seconds    Piriformis Stretch  3 reps;20 seconds      Lumbar Exercises: Aerobic   Tread Mill  NuStep Level 5 x 6 minutes    UBE (Upper Arm Bike)  level 3 x 6 minutes      Lumbar Exercises: Machines for Strengthening   Cybex Knee Extension  5lb 2x10     Cybex Knee Flexion  20lb 2x10     Other Lumbar Machine  Exercise  seated row and lats 15# 2x10      Lumbar Exercises: Standing   Other Standing Lumbar Exercises  gastroc stretches      Lumbar Exercises: Supine   Other Supine Lumbar Exercises  trunk rotation with green tband, weighted ball lift and overhead, ITB stretches    Other Supine Lumbar Exercises  feet on ball K2C, trunk rotaiton, small bridges and isometric abdominals      Moist Heat Therapy   Number Minutes Moist Heat  15 Minutes    Moist Heat Location  Cervical;Lumbar Spine               PT Short Term Goals - 03/16/17 1527      PT SHORT TERM GOAL #1   Title  She will be independent with intial HEP    Status  Partially Met        PT Long Term Goals - 04/06/17 1527      PT LONG TERM GOAL #1  Title  resume a walking program, or be able to use the gym at her apartment    Status  Partially Met      PT [place] #3   Title  She will be able to lift and carry normal packages without assist due to decr pain (groceries)    Status  On-going      PT LONG TERM GOAL #4   Title  Her neck motion will be WNL with 1/10 max pain    Status  On-going      PT LONG TERM GOAL #5   Title  lumbar ROM will increase 50%    Status  On-going            Plan - 04/06/17 1525    Clinical Impression Statement  ITB, piriformis and calves are very tight.  She seems to tolerate the exercises better with better quality motions and less guarding.  She reports that the heat is the best thing for her.  Shows increased shoulder ROM     PT Next Visit Plan  stretches    Consulted and Agree with Plan of Care  Patient       Patient will benefit from skilled therapeutic intervention in order to improve the following deficits and impairments:  Postural dysfunction, Decreased strength, Decreased activity tolerance, Pain, Increased muscle spasms, Decreased range of motion, Improper body mechanics, Impaired flexibility  Visit Diagnosis: Cervicalgia  Acute bilateral low back pain with  bilateral sciatica  Abnormal posture  Other muscle spasm  Acute pain of left shoulder     Problem List Patient Active Problem List   Diagnosis Date Noted  . Hyperglycemia 03/17/2017  . Fibromyalgia 10/25/2012  . LLQ abdominal pain 09/08/2012  . Pelvic pain in female 10/02/2011  . Oligomenorrhea 05/20/2011  . VITAMIN D DEFICIENCY 02/15/2009  . OBESITY 06/07/2008  . ANEMIA 04/19/2008  . 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 FATIGUE 04/18/2008  . SKIN RASH 09/21/2007  . BREAST PAIN, BILATERAL 08/22/2007  . CALCANEAL SPUR, RIGHT 08/22/2007  . HEADACHE 08/22/2007  . CALLUSES, FEET, BILATERAL 08/08/2007  . FOOT PAIN, BILATERAL 08/08/2007  . CANDIDIASIS 07/18/2007  . LUMBAGO 07/18/2007  . HELICOBACTER PYLORI GASTRITIS, HX OF 05/30/2007  . POSITIVE PPD 05/18/2007    [name], PT 04/06/2017, 3:28 PM  [place] [address] [place], [place], [address] Phone: [phone]   Fax:  [phone]  Name: Georga Stys MRN: [id number] Date of Birth: Jun 19, 1970

## 2017-04-08 ENCOUNTER — Encounter: Payer: Self-pay | Admitting: Physical Therapy

## 2017-04-08 ENCOUNTER — Ambulatory Visit: Payer: Medicaid Other | Admitting: Physical Therapy

## 2017-04-08 DIAGNOSIS — M5442 Lumbago with sciatica, left side: Secondary | ICD-10-CM | POA: Diagnosis not present

## 2017-04-08 DIAGNOSIS — M62838 Other muscle spasm: Secondary | ICD-10-CM

## 2017-04-08 DIAGNOSIS — M5441 Lumbago with sciatica, right side: Secondary | ICD-10-CM

## 2017-04-08 DIAGNOSIS — M25512 Pain in left shoulder: Secondary | ICD-10-CM

## 2017-04-08 DIAGNOSIS — R293 Abnormal posture: Secondary | ICD-10-CM

## 2017-04-08 DIAGNOSIS — M542 Cervicalgia: Secondary | ICD-10-CM

## 2017-04-08 NOTE — Therapy (Signed)
[place], [place], [address] Phone: [phone]   Fax:  [phone]  Physical Therapy Treatment  Patient Details  Name: Kristin Joseph MRN: [id number] Date of Birth: 1970-11-22 Referring Provider: Kevan Ny   Encounter Date: 04/08/2017  PT End of Session - 04/08/17 1532    Visit Number  10    PT Start Time  1440 heat    PT Stop Time  5885    PT Time Calculation (min)  50 min    Activity Tolerance  Patient tolerated treatment well;Patient limited by pain    Behavior During Therapy  [facility] for tasks assessed/performed       Past Medical History:  Diagnosis Date  . Back pain   . Depression   . Fibromyalgia 10/25/2012  . Gastritis 06/28/2012   Mild  . Obesity   . Pelvic pain   . Stress headaches   . Vitamin D deficiency     Past Surgical History:  Procedure Laterality Date  . APPENDECTOMY    . FOOT SURGERY Bilateral    Plantar fasciitis  . THYROIDECTOMY, PARTIAL     pt denies    There were no vitals filed for this visit.  Subjective Assessment - 04/08/17 1530    Subjective  Patient really likes the heat and feels like it is what helps her the most.  She reports that she does not like exercise but feels that it helps overall    Currently in Pain?  Yes    Pain Score  4     Pain Location  Back                      [facility] Adult PT Treatment/Exercise - 04/08/17 0001      Lumbar Exercises: Stretches   Passive Hamstring Stretch  3 reps;20 seconds    Single Knee to Chest Stretch  3 reps;20 seconds    ITB Stretch  3 reps;20 seconds    Piriformis Stretch  3 reps;20 seconds      Lumbar Exercises: Aerobic   Tread Mill  NuStep Level 5 x 6 minutes    UBE (Upper Arm Bike)  level 3 x 6 minutes      Lumbar Exercises: Machines for Strengthening   Cybex Knee Extension  5lb 2x10     Cybex Knee Flexion  20lb 2x10     Other Lumbar Machine Exercise  seated row and lats 15# 2x10       Lumbar Exercises: Standing   Other Standing Lumbar Exercises  gastroc stretches      Lumbar Exercises: Supine   Clam  20 reps;2 seconds    Other Supine Lumbar Exercises  feet on ball K2C, trunk rotaiton, small bridges and isometric abdominals      Moist Heat Therapy   Number Minutes Moist Heat  15 Minutes    Moist Heat Location  Cervical;Lumbar Spine               PT Short Term Goals - 03/16/17 1527      PT SHORT TERM GOAL #1   Title  She will be independent with intial HEP    Status  Partially Met        PT Long Term Goals - 04/06/17 1527      PT LONG TERM GOAL #1   Title  resume a walking program, or be able to use the gym at her apartment  Status  Partially Met      PT LONG TERM GOAL #3   Title  She will be able to lift and carry normal packages without assist due to decr pain (groceries)    Status  On-going      PT LONG TERM GOAL #4   Title  Her neck motion will be WNL with 1/10 max pain    Status  On-going      PT LONG TERM GOAL #5   Title  lumbar ROM will increase 50%    Status  On-going            Plan - 04/08/17 1533    Clinical Impression Statement  Patient feels like the heat helps the most, reports that she cannot find to do at home, she reports that she has tried different hot packs that she has bought but nothing like what we have.  Reports that she has frequent urination and incontinence at times, gave her info about [employer] and told her to talk to MD and that there are PT's that can help this    PT Next Visit Plan  try to get her to exercise    Consulted and Agree with Plan of Care  Patient       Patient will benefit from skilled therapeutic intervention in order to improve the following deficits and impairments:  Postural dysfunction, Decreased strength, Decreased activity tolerance, Pain, Increased muscle spasms, Decreased range of motion, Improper body mechanics, Impaired flexibility  Visit Diagnosis: Cervicalgia  Acute  bilateral low back pain with bilateral sciatica  Abnormal posture  Other muscle spasm  Acute pain of left shoulder     Problem List Patient Active Problem List   Diagnosis Date Noted  . Hyperglycemia 03/17/2017  . Fibromyalgia 10/25/2012  . LLQ abdominal pain 09/08/2012  . Pelvic pain in female 10/02/2011  . Oligomenorrhea 05/20/2011  . VITAMIN D DEFICIENCY 02/15/2009  . OBESITY 06/07/2008  . ANEMIA 04/19/2008  . 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 FATIGUE 04/18/2008  . SKIN RASH 09/21/2007  . BREAST PAIN, BILATERAL 08/22/2007  . CALCANEAL SPUR, RIGHT 08/22/2007  . HEADACHE 08/22/2007  . CALLUSES, FEET, BILATERAL 08/08/2007  . FOOT PAIN, BILATERAL 08/08/2007  . CANDIDIASIS 07/18/2007  . LUMBAGO 07/18/2007  . HELICOBACTER PYLORI GASTRITIS, HX OF 05/30/2007  . POSITIVE PPD 05/18/2007    [name], PT 04/08/2017, 3:50 PM  [place] [address] [place], [place], [address] Phone: [phone]   Fax:  [phone]  Name: Kristin Joseph MRN: [id number] Date of Birth: [birth year]

## 2017-04-13 ENCOUNTER — Encounter: Payer: Self-pay | Admitting: Physical Therapy

## 2017-04-13 ENCOUNTER — Ambulatory Visit: Payer: Medicaid Other | Admitting: Physical Therapy

## 2017-04-13 DIAGNOSIS — M5442 Lumbago with sciatica, left side: Secondary | ICD-10-CM

## 2017-04-13 DIAGNOSIS — M62838 Other muscle spasm: Secondary | ICD-10-CM

## 2017-04-13 DIAGNOSIS — M542 Cervicalgia: Secondary | ICD-10-CM

## 2017-04-13 DIAGNOSIS — M5441 Lumbago with sciatica, right side: Secondary | ICD-10-CM

## 2017-04-13 DIAGNOSIS — R293 Abnormal posture: Secondary | ICD-10-CM

## 2017-04-13 DIAGNOSIS — M25512 Pain in left shoulder: Secondary | ICD-10-CM

## 2017-04-13 NOTE — Therapy (Signed)
[place], [place], [address] Phone: [phone]   Fax:  [phone]  Physical Therapy Treatment  Patient Details  Name: Kristin Joseph MRN: [id number] Date of Birth: 1970/02/07 Referring Provider: Kevan Ny   Encounter Date: 04/13/2017  PT End of Session - 04/13/17 1009    Visit Number  11    Date for PT Re-Evaluation  04/26/17    PT Start Time  0941    PT Stop Time  1024    PT Time Calculation (min)  43 min       Past Medical History:  Diagnosis Date  . Back pain   . Depression   . Fibromyalgia 10/25/2012  . Gastritis 06/28/2012   Mild  . Obesity   . Pelvic pain   . Stress headaches   . Vitamin D deficiency     Past Surgical History:  Procedure Laterality Date  . APPENDECTOMY    . FOOT SURGERY Bilateral    Plantar fasciitis  . THYROIDECTOMY, PARTIAL     pt denies    There were no vitals filed for this visit.  Subjective Assessment - 04/13/17 0941    Subjective  "Pain always, come here helps"    Currently in Pain?  Yes    Pain Score  6     Pain Location  -- neck, back                No data recorded       [facility] Adult PT Treatment/Exercise - 04/13/17 0001      Lumbar Exercises: Aerobic   Tread Mill  NuStep Level 5 x 6 minutes    UBE (Upper Arm Bike)  level 3 x 6 minutes      Lumbar Exercises: Machines for Strengthening   Cybex Knee Extension  5lb 2x10     Cybex Knee Flexion  20lb 2x10     Other Lumbar Machine Exercise  seated row and lats 15# 2x10      Lumbar Exercises: Supine   Other Supine Lumbar Exercises  feet on ball K2C, trunk rotaiton, small bridges and isometric abdominals      Moist Heat Therapy   Number Minutes Moist Heat  15 Minutes    Moist Heat Location  Cervical;Lumbar Spine               PT Short Term Goals - 03/16/17 1527      PT SHORT TERM GOAL #1   Title  She will be independent with intial HEP    Status  Partially  Met        PT Long Term Goals - 04/06/17 1527      PT LONG TERM GOAL #1   Title  resume a walking program, or be able to use the gym at her apartment    Status  Partially Met      PT LONG TERM GOAL #3   Title  She will be able to lift and carry normal packages without assist due to decr pain (groceries)    Status  On-going      PT LONG TERM GOAL #4   Title  Her neck motion will be WNL with 1/10 max pain    Status  On-going      PT LONG TERM GOAL #5   Title  lumbar ROM will increase 50%    Status  On-going            Plan -  04/13/17 1011    Clinical Impression Statement  Patient checked in 5 minutes late then spent 6 minutes in the rest room. overall making her 11 minutes late for today's session. Pt requires encouragement to do exercises, she expressed that she really like the heat only. Min effort with seated LE exercises. Tactile and verbal cueing for posture during seated rows.    Rehab Potential  Fair    PT Frequency  2x / week    PT Duration  8 weeks    PT Treatment/Interventions  Patient/family education;Passive range of motion;Manual techniques;Therapeutic exercise;Therapeutic activities;Electrical Stimulation;Moist Heat;Dry needling    PT Next Visit Plan  try to get her to exercise       Patient will benefit from skilled therapeutic intervention in order to improve the following deficits and impairments:  Postural dysfunction, Decreased strength, Decreased activity tolerance, Pain, Increased muscle spasms, Decreased range of motion, Improper body mechanics, Impaired flexibility  Visit Diagnosis: Cervicalgia  Acute bilateral low back pain with bilateral sciatica  Abnormal posture  Other muscle spasm  Acute pain of left shoulder     Problem List Patient Active Problem List   Diagnosis Date Noted  . Hyperglycemia 03/17/2017  . Fibromyalgia 10/25/2012  . LLQ abdominal pain 09/08/2012  . Pelvic pain in female 10/02/2011  . Oligomenorrhea 05/20/2011   . VITAMIN D DEFICIENCY 02/15/2009  . OBESITY 06/07/2008  . ANEMIA 04/19/2008  . 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 FATIGUE 04/18/2008  . SKIN RASH 09/21/2007  . BREAST PAIN, BILATERAL 08/22/2007  . CALCANEAL SPUR, RIGHT 08/22/2007  . HEADACHE 08/22/2007  . CALLUSES, FEET, BILATERAL 08/08/2007  . FOOT PAIN, BILATERAL 08/08/2007  . CANDIDIASIS 07/18/2007  . LUMBAGO 07/18/2007  . HELICOBACTER PYLORI GASTRITIS, HX OF 05/30/2007  . POSITIVE PPD 05/18/2007    Scot Jun, PTA 04/13/2017, 10:14 AM  [place], [place], [address] Phone: [phone]   Fax:  [phone]  Name: Kristin Joseph MRN: [id number] Date of Birth: 1970/01/31

## 2017-04-15 ENCOUNTER — Ambulatory Visit: Payer: Medicaid Other | Admitting: Physical Therapy

## 2017-04-16 ENCOUNTER — Ambulatory Visit: Payer: Medicaid Other | Admitting: Physical Therapy

## 2017-04-20 ENCOUNTER — Ambulatory Visit: Payer: Medicaid Other | Attending: Internal Medicine | Admitting: Physical Therapy

## 2017-04-20 ENCOUNTER — Encounter: Payer: Self-pay | Admitting: Physical Therapy

## 2017-04-20 DIAGNOSIS — M5441 Lumbago with sciatica, right side: Secondary | ICD-10-CM | POA: Insufficient documentation

## 2017-04-20 DIAGNOSIS — M25512 Pain in left shoulder: Secondary | ICD-10-CM | POA: Diagnosis present

## 2017-04-20 DIAGNOSIS — M542 Cervicalgia: Secondary | ICD-10-CM | POA: Insufficient documentation

## 2017-04-20 DIAGNOSIS — M5442 Lumbago with sciatica, left side: Secondary | ICD-10-CM | POA: Insufficient documentation

## 2017-04-20 DIAGNOSIS — M62838 Other muscle spasm: Secondary | ICD-10-CM | POA: Insufficient documentation

## 2017-04-20 DIAGNOSIS — R293 Abnormal posture: Secondary | ICD-10-CM | POA: Insufficient documentation

## 2017-04-20 NOTE — Therapy (Signed)
[place], [place], [address] Phone: [phone]   Fax:  [phone]  Physical Therapy Treatment  Patient Details  Name: Kristin Joseph MRN: [id number] Date of Birth: 05-29-1970 Referring Provider: Kevan Ny   Encounter Date: 04/20/2017  PT End of Session - 04/20/17 1155    Visit Number  12    Date for PT Re-Evaluation  04/26/17    PT Start Time  1110 10 minutes late    PT Stop Time  1155    PT Time Calculation (min)  45 min    Activity Tolerance  Patient tolerated treatment well;Patient limited by pain    Behavior During Therapy  [facility] for tasks assessed/performed       Past Medical History:  Diagnosis Date  . Back pain   . Depression   . Fibromyalgia 10/25/2012  . Gastritis 06/28/2012   Mild  . Obesity   . Pelvic pain   . Stress headaches   . Vitamin D deficiency     Past Surgical History:  Procedure Laterality Date  . APPENDECTOMY    . FOOT SURGERY Bilateral    Plantar fasciitis  . THYROIDECTOMY, PARTIAL     pt denies    There were no vitals filed for this visit.  Subjective Assessment - 04/20/17 1113    Subjective  Sometimes feel a little better, but usually hurt    Currently in Pain?  Yes    Pain Score  5     Pain Location  Back    Aggravating Factors   activity    Pain Relieving Factors  heat helps the most                       [facility] Adult PT Treatment/Exercise - 04/20/17 0001      Lumbar Exercises: Aerobic   Stationary Bike  bike x 6 minutes    UBE (Upper Arm Bike)  level 3 x 6 minutes      Lumbar Exercises: Seated   Other Seated Lumbar Exercises  on sit fit pelvic mobility and then stability exercises, then weighted ball overhead lift and trunk rotation    Other Seated Lumbar Exercises  green tband scapular stabilization, bent over row 3#      Lumbar Exercises: Supine   Other Supine Lumbar Exercises  feet on ball K2C, trunk rotaiton, small  bridges and isometric abdominals      Knee/Hip Exercises: Stretches   Piriformis Stretch  3 reps;30 seconds    Gastroc Stretch  3 reps;20 seconds      Moist Heat Therapy   Number Minutes Moist Heat  15 Minutes    Moist Heat Location  Cervical;Lumbar Spine               PT Short Term Goals - 03/16/17 1527      PT SHORT TERM GOAL #1   Title  She will be independent with intial HEP    Status  Partially Met        PT Long Term Goals - 04/20/17 1158      PT LONG TERM GOAL #1   Title  resume a walking program, or be able to use the gym at her apartment    Status  Partially Met      PT LONG TERM GOAL #5   Title  lumbar ROM will increase 50%    Status  Partially Met  Plan - 04/20/17 1156    Clinical Impression Statement  Patient again late 10 minutes, she reports only heat is helping, she has c/o tightness in the calves, pain in the buttocks and the lateral legs.  Needs a lot of verbal, visual and tactile cues to do exercises correctly    PT Next Visit Plan  continue to work on her strength and function    Consulted and Agree with Plan of Care  Patient       Patient will benefit from skilled therapeutic intervention in order to improve the following deficits and impairments:  Postural dysfunction, Decreased strength, Decreased activity tolerance, Pain, Increased muscle spasms, Decreased range of motion, Improper body mechanics, Impaired flexibility  Visit Diagnosis: Cervicalgia  Acute bilateral low back pain with bilateral sciatica  Abnormal posture  Other muscle spasm  Acute pain of left shoulder     Problem List Patient Active Problem List   Diagnosis Date Noted  . Hyperglycemia 03/17/2017  . Fibromyalgia 10/25/2012  . LLQ abdominal pain 09/08/2012  . Pelvic pain in female 10/02/2011  . Oligomenorrhea 05/20/2011  . VITAMIN D DEFICIENCY 02/15/2009  . OBESITY 06/07/2008  . ANEMIA 04/19/2008  . 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 FATIGUE  04/18/2008  . SKIN RASH 09/21/2007  . BREAST PAIN, BILATERAL 08/22/2007  . CALCANEAL SPUR, RIGHT 08/22/2007  . HEADACHE 08/22/2007  . CALLUSES, FEET, BILATERAL 08/08/2007  . FOOT PAIN, BILATERAL 08/08/2007  . CANDIDIASIS 07/18/2007  . LUMBAGO 07/18/2007  . HELICOBACTER PYLORI GASTRITIS, HX OF 05/30/2007  . POSITIVE PPD 05/18/2007    [name], PT 04/20/2017, 11:59 AM  [place] [address] [place], [place], [address] Phone: [phone]   Fax:  [phone]  Name: [name] MRN: [id number] Date of Birth: [birth year]

## 2017-04-22 ENCOUNTER — Ambulatory Visit: Payer: Medicaid Other | Admitting: Physical Therapy

## 2017-04-23 ENCOUNTER — Ambulatory Visit: Payer: Medicaid Other | Admitting: Physical Therapy

## 2017-04-23 ENCOUNTER — Encounter: Payer: Self-pay | Admitting: Physical Therapy

## 2017-04-23 DIAGNOSIS — M5441 Lumbago with sciatica, right side: Secondary | ICD-10-CM

## 2017-04-23 DIAGNOSIS — M25512 Pain in left shoulder: Secondary | ICD-10-CM

## 2017-04-23 DIAGNOSIS — M62838 Other muscle spasm: Secondary | ICD-10-CM

## 2017-04-23 DIAGNOSIS — M542 Cervicalgia: Secondary | ICD-10-CM | POA: Diagnosis not present

## 2017-04-23 DIAGNOSIS — M5442 Lumbago with sciatica, left side: Secondary | ICD-10-CM

## 2017-04-23 DIAGNOSIS — R293 Abnormal posture: Secondary | ICD-10-CM

## 2017-04-23 NOTE — Therapy (Signed)
[place], [place], [address] Phone: [phone]   Fax:  [phone]  Physical Therapy Treatment  Patient Details  Name: Kristin Joseph MRN: [id number] Date of Birth: 13-Jan-1971 Referring Provider: Kevan Ny   Encounter Date: 04/23/2017  PT End of Session - 04/23/17 1133    Visit Number  13    Date for PT Re-Evaluation  05/26/17    PT Start Time  1030 15 minutes late    PT Stop Time  1130    PT Time Calculation (min)  60 min    Activity Tolerance  Patient tolerated treatment well;Patient limited by pain    Behavior During Therapy  [facility] for tasks assessed/performed       Past Medical History:  Diagnosis Date  . Back pain   . Depression   . Fibromyalgia 10/25/2012  . Gastritis 06/28/2012   Mild  . Obesity   . Pelvic pain   . Stress headaches   . Vitamin D deficiency     Past Surgical History:  Procedure Laterality Date  . APPENDECTOMY    . FOOT SURGERY Bilateral    Plantar fasciitis  . THYROIDECTOMY, PARTIAL     pt denies    There were no vitals filed for this visit.                    [facility] Adult PT Treatment/Exercise - 04/23/17 0001      Lumbar Exercises: Aerobic   Stationary Bike  bike x 5 minutes    UBE (Upper Arm Bike)  level 3 x 6 minutes      Lumbar Exercises: Machines for Strengthening   Cybex Knee Extension  10lb 2x10     Cybex Knee Flexion  20lb 2x10     Other Lumbar Machine Exercise  seated row and lats 15# 2x10      Lumbar Exercises: Standing   Row  Theraband;20 reps;Both    Theraband Level (Row)  Level 4 (Blue)    Shoulder Extension  Theraband;20 reps;Both    Theraband Level (Shoulder Extension)  Level 4 (Blue)    Other Standing Lumbar Exercises  gastroc stretches      Lumbar Exercises: Seated   Other Seated Lumbar Exercises  green tband scapular stabilization, bent over row 3#      Moist Heat Therapy   Number Minutes Moist Heat  15  Minutes    Moist Heat Location  Cervical;Lumbar Spine               PT Short Term Goals - 03/16/17 1527      PT SHORT TERM GOAL #1   Title  She will be independent with intial HEP    Status  Partially Met        PT Long Term Goals - 04/23/17 1137      PT LONG TERM GOAL #1   Title  resume a walking program, or be able to use the gym at her apartment    Status  Partially Met      PT LONG TERM GOAL #2   Title  She will report neck and back pain as intermittant    Status  Partially Met      PT LONG TERM GOAL #3   Title  She will be able to lift and carry normal packages without assist due to decr pain (groceries)    Status  Partially Met      PT LONG  TERM GOAL #4   Title  Her neck motion will be WNL with 1/10 max pain    Status  On-going      PT LONG TERM GOAL #5   Title  lumbar ROM will increase 50%    Status  Partially Met      PT LONG TERM GOAL #6   Title  She will return to normal home tasks such as vacuuming with 1-2 max pain    Status  Partially Met            Plan - 04/23/17 1134    Clinical Impression Statement  Patient 15 minutes late.  She reports only heat helps, she is moving better overall, her ROM is WNL's for the lumbar spine and the cervical spine, she does c/o pain with these motions, c/o tightness and tenderness in the lateral hips and the ITB area.  Calves and hips are tight.  She does not like the exercises and continues to need multiple cues to do them correctly, she is very weak in the core mms.    PT Frequency  1x / week    PT Duration  4 weeks    PT Next Visit Plan  we will work on strength andlooke to d/c in the next period    [employer] and Agree with Plan of Care  Patient       Patient will benefit from skilled therapeutic intervention in order to improve the following deficits and impairments:  Postural dysfunction, Decreased strength, Decreased activity tolerance, Pain, Increased muscle spasms, Decreased range of motion, Improper  body mechanics, Impaired flexibility  Visit Diagnosis: Cervicalgia  Acute bilateral low back pain with bilateral sciatica  Abnormal posture  Other muscle spasm  Acute pain of left shoulder     Problem List Patient Active Problem List   Diagnosis Date Noted  . Hyperglycemia 03/17/2017  . Fibromyalgia 10/25/2012  . LLQ abdominal pain 09/08/2012  . Pelvic pain in female 10/02/2011  . Oligomenorrhea 05/20/2011  . VITAMIN D DEFICIENCY 02/15/2009  . OBESITY 06/07/2008  . ANEMIA 04/19/2008  . 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 FATIGUE 04/18/2008  . SKIN RASH 09/21/2007  . BREAST PAIN, BILATERAL 08/22/2007  . CALCANEAL SPUR, RIGHT 08/22/2007  . HEADACHE 08/22/2007  . CALLUSES, FEET, BILATERAL 08/08/2007  . FOOT PAIN, BILATERAL 08/08/2007  . CANDIDIASIS 07/18/2007  . LUMBAGO 07/18/2007  . HELICOBACTER PYLORI GASTRITIS, HX OF 05/30/2007  . POSITIVE PPD 05/18/2007    [name], PT 04/23/2017, 11:38 AM  [place] [address] [place], [place], [address] Phone: [phone]   Fax:  [phone]  Name: Kristin Joseph MRN: [id number] Date of Birth: [birth year]

## 2017-04-27 ENCOUNTER — Ambulatory Visit: Payer: Medicaid Other | Admitting: Physical Therapy

## 2017-04-29 ENCOUNTER — Ambulatory Visit: Payer: Medicaid Other | Admitting: Physical Therapy

## 2017-04-29 ENCOUNTER — Encounter: Payer: Self-pay | Admitting: Physical Therapy

## 2017-04-29 DIAGNOSIS — R293 Abnormal posture: Secondary | ICD-10-CM

## 2017-04-29 DIAGNOSIS — M542 Cervicalgia: Secondary | ICD-10-CM

## 2017-04-29 DIAGNOSIS — M62838 Other muscle spasm: Secondary | ICD-10-CM

## 2017-04-29 DIAGNOSIS — M25512 Pain in left shoulder: Secondary | ICD-10-CM

## 2017-04-29 DIAGNOSIS — M5442 Lumbago with sciatica, left side: Secondary | ICD-10-CM

## 2017-04-29 DIAGNOSIS — M5441 Lumbago with sciatica, right side: Secondary | ICD-10-CM

## 2017-04-29 NOTE — Therapy (Signed)
[place], [place], [address] Phone: [phone]   Fax:  [phone]  Physical Therapy Treatment  Patient Details  Name: Kristin Joseph MRN: [id number] Date of Birth: [birth year] Referring Provider: Kevan Ny   Encounter Date: 04/29/2017  PT End of Session - 04/29/17 1438    Visit Number  14    Date for PT Re-Evaluation  05/26/17    PT Start Time  1400    PT Stop Time  1455    PT Time Calculation (min)  55 min    Activity Tolerance  Patient limited by pain    Behavior During Therapy  [facility] for tasks assessed/performed       Past Medical History:  Diagnosis Date  . Back pain   . Depression   . Fibromyalgia 10/25/2012  . Gastritis 06/28/2012   Mild  . Obesity   . Pelvic pain   . Stress headaches   . Vitamin D deficiency     Past Surgical History:  Procedure Laterality Date  . APPENDECTOMY    . FOOT SURGERY Bilateral    Plantar fasciitis  . THYROIDECTOMY, PARTIAL     pt denies    There were no vitals filed for this visit.  Subjective Assessment - 04/29/17 1412    Subjective  reports still hurting    Currently in Pain?  Yes    Pain Score  5     Pain Location  Back    Aggravating Factors   movements    Pain Relieving Factors  heat                       [facility] Adult PT Treatment/Exercise - 04/29/17 0001      Lumbar Exercises: Aerobic   Tread Mill  NuStep Level 5 x 6 minutes    UBE (Upper Arm Bike)  level 3 x 6 minutes      Lumbar Exercises: Machines for Strengthening   Cybex Knee Extension  10lb 2x10     Cybex Knee Flexion  20lb 2x10     Other Lumbar Machine Exercise  seated row and lats 15# 2x10      Lumbar Exercises: Standing   Other Standing Lumbar Exercises  2.5# hip extension, abduction and flexion      Lumbar Exercises: Seated   Other Seated Lumbar Exercises  black tband back extension      Moist Heat Therapy   Number Minutes Moist Heat  15  Minutes    Moist Heat Location  Cervical;Lumbar Spine               PT Short Term Goals - 03/16/17 1527      PT SHORT TERM GOAL #1   Title  She will be independent with intial HEP    Status  Partially Met        PT Long Term Goals - 04/29/17 1445      PT LONG TERM GOAL #1   Title  resume a walking program, or be able to use the gym at her apartment    Status  Partially Met      PT LONG TERM GOAL #2   Status  Partially Met      PT LONG TERM GOAL #3   Title  She will be able to lift and carry normal packages without assist due to decr pain (groceries)    Status  Partially Met  Plan - 04/29/17 1439    Clinical Impression Statement  Patient reports that her back may be a little better.  She reports bilateral hip pain and neck pain.  She needs a lot of cues to do things correctly, tends to do very large motions and use her body this requires verbal and tactile cues to correct her.  She reports that she has talked to the MD about getting her mom and her to continue more.  I told her that she has to be making progress to continue and as of right now I am not seeing carryover of pain relief or increase in functional gains when I question her.    PT Next Visit Plan  we will work on strength andlooke to d/c in the next period    Consulted and Agree with Plan of Care  Patient       Patient will benefit from skilled therapeutic intervention in order to improve the following deficits and impairments:  Postural dysfunction, Decreased strength, Decreased activity tolerance, Pain, Increased muscle spasms, Decreased range of motion, Improper body mechanics, Impaired flexibility  Visit Diagnosis: Cervicalgia  Acute bilateral low back pain with bilateral sciatica  Abnormal posture  Other muscle spasm  Acute pain of left shoulder     Problem List Patient Active Problem List   Diagnosis Date Noted  . Hyperglycemia 03/17/2017  . Fibromyalgia 10/25/2012  . LLQ  abdominal pain 09/08/2012  . Pelvic pain in female 10/02/2011  . Oligomenorrhea 05/20/2011  . VITAMIN D DEFICIENCY 02/15/2009  . OBESITY 06/07/2008  . ANEMIA 04/19/2008  . 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 FATIGUE 04/18/2008  . SKIN RASH 09/21/2007  . BREAST PAIN, BILATERAL 08/22/2007  . CALCANEAL SPUR, RIGHT 08/22/2007  . HEADACHE 08/22/2007  . CALLUSES, FEET, BILATERAL 08/08/2007  . FOOT PAIN, BILATERAL 08/08/2007  . CANDIDIASIS 07/18/2007  . LUMBAGO 07/18/2007  . HELICOBACTER PYLORI GASTRITIS, HX OF 05/30/2007  . POSITIVE PPD 05/18/2007    [name], PT 04/29/2017, 2:45 PM  [place], [place], [address] Phone: [phone]   Fax:  [phone]  Name: Kristin Joseph MRN: [id number] Date of Birth: Oct 27, 1970

## 2017-05-04 ENCOUNTER — Encounter: Payer: Self-pay | Admitting: Physical Therapy

## 2017-05-04 ENCOUNTER — Ambulatory Visit: Payer: Medicaid Other | Admitting: Physical Therapy

## 2017-05-04 DIAGNOSIS — R293 Abnormal posture: Secondary | ICD-10-CM

## 2017-05-04 DIAGNOSIS — M62838 Other muscle spasm: Secondary | ICD-10-CM

## 2017-05-04 DIAGNOSIS — M5442 Lumbago with sciatica, left side: Secondary | ICD-10-CM

## 2017-05-04 DIAGNOSIS — M5441 Lumbago with sciatica, right side: Secondary | ICD-10-CM

## 2017-05-04 DIAGNOSIS — M542 Cervicalgia: Secondary | ICD-10-CM | POA: Diagnosis not present

## 2017-05-04 NOTE — Therapy (Signed)
[place], [place], [address] Phone: [phone]   Fax:  [phone]  Physical Therapy Treatment  Patient Details  Name: Kristin Joseph MRN: [id number] Date of Birth: 05-15-1970 Referring Provider: Kevan Ny   Encounter Date: 05/04/2017  PT End of Session - 05/04/17 1304    Visit Number  15    Date for PT Re-Evaluation  05/26/17    PT Start Time  1122 Patient 22 minutes late, chaecked in 5 minutes late and then was in the bathroom for 15 minutes    PT Stop Time  1200    PT Time Calculation (min)  38 min    Activity Tolerance  Patient limited by pain    Behavior During Therapy  [facility] for tasks assessed/performed       Past Medical History:  Diagnosis Date  . Back pain   . Depression   . Fibromyalgia 10/25/2012  . Gastritis 06/28/2012   Mild  . Obesity   . Pelvic pain   . Stress headaches   . Vitamin D deficiency     Past Surgical History:  Procedure Laterality Date  . APPENDECTOMY    . FOOT SURGERY Bilateral    Plantar fasciitis  . THYROIDECTOMY, PARTIAL     pt denies    There were no vitals filed for this visit.  Subjective Assessment - 05/04/17 1127    Subjective  Still pain, she mentions urinary incontinence, she also reports pelvic pain, I had given her information about [facility], I also told her today that there are PT's that specialize in this area and may help.    Currently in Pain?  Yes    Pain Score  5     Pain Location  Back    Aggravating Factors   "anything"    Pain Relieving Factors  "heat helps for a day"                       [facility] Adult PT Treatment/Exercise - 05/04/17 0001      Lumbar Exercises: Aerobic   UBE (Upper Arm Bike)  level 3 x 6 minutes      Lumbar Exercises: Machines for Strengthening   Cybex Lumbar Extension  black tband    Cybex Knee Extension  10lb 2x10     Cybex Knee Flexion  20lb 2x10     Leg Press  20# 2x10    Other  Lumbar Machine Exercise  seated row and lats 15# 2x10      Lumbar Exercises: Standing   Other Standing Lumbar Exercises  2.5# hip extension, abduction and flexion      Lumbar Exercises: Supine   Other Supine Lumbar Exercises  feet on ball K2C, trunk rotaiton, small bridges and isometric abdominals               PT Short Term Goals - 03/16/17 1527      PT SHORT TERM GOAL #1   Title  She will be independent with intial HEP    Status  Partially Met        PT Long Term Goals - 04/29/17 1445      PT LONG TERM GOAL #1   Title  resume a walking program, or be able to use the gym at her apartment    Status  Partially Met      PT LONG TERM GOAL #2   Status  Partially Met  PT LONG TERM GOAL #3   Title  She will be able to lift and carry normal packages without assist due to decr pain (groceries)    Status  Partially Met            Plan - 05/04/17 1305    Clinical Impression Statement  Patient comes in close to being on time and then goes to the bathroom for 15 minutes, reports that she is having incontinence issues.  She reports that she did talk with MD about this.  She reports that she feels stronger but that the only thing that helps her pain is the heat    PT Next Visit Plan  we will work on strength and look to d/c in the next period    Consulted and Agree with Plan of Care  Patient       Patient will benefit from skilled therapeutic intervention in order to improve the following deficits and impairments:  Postural dysfunction, Decreased strength, Decreased activity tolerance, Pain, Increased muscle spasms, Decreased range of motion, Improper body mechanics, Impaired flexibility  Visit Diagnosis: Cervicalgia  Acute bilateral low back pain with bilateral sciatica  Abnormal posture  Other muscle spasm     Problem List Patient Active Problem List   Diagnosis Date Noted  . Hyperglycemia 03/17/2017  . Fibromyalgia 10/25/2012  . LLQ abdominal pain  09/08/2012  . Pelvic pain in female 10/02/2011  . Oligomenorrhea 05/20/2011  . VITAMIN D DEFICIENCY 02/15/2009  . OBESITY 06/07/2008  . ANEMIA 04/19/2008  . 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 FATIGUE 04/18/2008  . SKIN RASH 09/21/2007  . BREAST PAIN, BILATERAL 08/22/2007  . CALCANEAL SPUR, RIGHT 08/22/2007  . HEADACHE 08/22/2007  . CALLUSES, FEET, BILATERAL 08/08/2007  . FOOT PAIN, BILATERAL 08/08/2007  . CANDIDIASIS 07/18/2007  . LUMBAGO 07/18/2007  . HELICOBACTER PYLORI GASTRITIS, HX OF 05/30/2007  . POSITIVE PPD 05/18/2007    Kristin Boast., PT 05/04/2017, 1:08 PM  [place] [address] [place], [place], [address] Phone: [phone]   Fax:  [phone]  Name: Kristin Joseph MRN: [id number] Date of Birth: [birth year]

## 2017-05-06 ENCOUNTER — Ambulatory Visit: Payer: Medicaid Other | Admitting: Physical Therapy

## 2017-05-11 ENCOUNTER — Ambulatory Visit: Payer: Medicaid Other | Admitting: Physical Therapy

## 2017-05-11 ENCOUNTER — Encounter: Payer: Self-pay | Admitting: Physical Therapy

## 2017-05-11 DIAGNOSIS — M5441 Lumbago with sciatica, right side: Secondary | ICD-10-CM

## 2017-05-11 DIAGNOSIS — M62838 Other muscle spasm: Secondary | ICD-10-CM

## 2017-05-11 DIAGNOSIS — M542 Cervicalgia: Secondary | ICD-10-CM

## 2017-05-11 DIAGNOSIS — M25512 Pain in left shoulder: Secondary | ICD-10-CM

## 2017-05-11 DIAGNOSIS — R293 Abnormal posture: Secondary | ICD-10-CM

## 2017-05-11 DIAGNOSIS — M5442 Lumbago with sciatica, left side: Secondary | ICD-10-CM

## 2017-05-11 NOTE — Therapy (Signed)
[place], [place], [address] Phone: [phone]   Fax:  [phone]  Physical Therapy Treatment  Patient Details  Name: Kristin Joseph MRN: [id number] Date of Birth: 08/28/1970 Referring Provider: Kevan Ny   Encounter Date: 05/11/2017  PT End of Session - 05/11/17 1152    Visit Number  16    Date for PT Re-Evaluation  05/26/17    PT Start Time  1110    PT Stop Time  1200    PT Time Calculation (min)  50 min    Activity Tolerance  Patient limited by pain    Behavior During Therapy  [facility] for tasks assessed/performed       Past Medical History:  Diagnosis Date  . Back pain   . Depression   . Fibromyalgia 10/25/2012  . Gastritis 06/28/2012   Mild  . Obesity   . Pelvic pain   . Stress headaches   . Vitamin D deficiency     Past Surgical History:  Procedure Laterality Date  . APPENDECTOMY    . FOOT SURGERY Bilateral    Plantar fasciitis  . THYROIDECTOMY, PARTIAL     pt denies    There were no vitals filed for this visit.  Subjective Assessment - 05/11/17 1117    Subjective  Patient less late today.  She reports that she has a new script from MD for PT, I explained to her that there has to be a reason, progress made, and goals as well as skilled service provided, I spoke with her about this period will be the last and will look to d/c, she reports that she has sciatica in both legs with walking or standing    Currently in Pain?  Yes    Pain Score  4     Pain Location  Back                       [facility] Adult PT Treatment/Exercise - 05/11/17 0001      Ambulation/Gait   Gait Comments  walked around the building 2 laps without rest, just slowed down some      Neck Exercises: Supine   Neck Retraction  10 reps      Lumbar Exercises: Stretches   Passive Hamstring Stretch  3 reps;20 seconds    Piriformis Stretch  3 reps;20 seconds      Lumbar Exercises: Aerobic    UBE (Upper Arm Bike)  level 3 x 6 minutes      Lumbar Exercises: Machines for Strengthening   Cybex Lumbar Extension  black tband 20 reps    Leg Press  20# 2x10    Other Lumbar Machine Exercise  seated row and lats 15# 2x10      Lumbar Exercises: Standing   Other Standing Lumbar Exercises  2.5# hip extension, abduction and flexion      Lumbar Exercises: Supine   Bridge  20 reps    Other Supine Lumbar Exercises  feet on ball K2C, trunk rotaiton, small bridges and isometric abdominals      Moist Heat Therapy   Number Minutes Moist Heat  15 Minutes    Moist Heat Location  Cervical;Lumbar Spine               PT Short Term Goals - 03/16/17 1527      PT SHORT TERM GOAL #1   Title  She will be independent with intial HEP  Status  Partially Met        PT Long Term Goals - 04/29/17 1445      PT LONG TERM GOAL #1   Title  resume a walking program, or be able to use the gym at her apartment    Status  Partially [place] #2   Status  Partially Met      PT LONG TERM GOAL #3   Title  She will be able to lift and carry normal packages without assist due to decr pain (groceries)    Status  Partially Met            Plan - 05/11/17 1245    Clinical Impression Statement  Patient continues to report that she has pain in the neck, back, hips and legs with any activity.  She reports that with the moist heat we put on her she will get 3 days of releif of pain.  She has a disabled daughter that does cause her to do a lot of care.    PT Next Visit Plan  we will work on strength and look to d/c in the next period    Consulted and Agree with Plan of Care  Patient       Patient will benefit from skilled therapeutic intervention in order to improve the following deficits and impairments:  Postural dysfunction, Decreased strength, Decreased activity tolerance, Pain, Increased muscle spasms, Decreased range of motion, Improper body mechanics, Impaired  flexibility  Visit Diagnosis: Cervicalgia  Acute bilateral low back pain with bilateral sciatica  Abnormal posture  Other muscle spasm  Acute pain of left shoulder     Problem List Patient Active Problem List   Diagnosis Date Noted  . Hyperglycemia 03/17/2017  . Fibromyalgia 10/25/2012  . LLQ abdominal pain 09/08/2012  . Pelvic pain in female 10/02/2011  . Oligomenorrhea 05/20/2011  . VITAMIN D DEFICIENCY 02/15/2009  . OBESITY 06/07/2008  . ANEMIA 04/19/2008  . 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 FATIGUE 04/18/2008  . SKIN RASH 09/21/2007  . BREAST PAIN, BILATERAL 08/22/2007  . CALCANEAL SPUR, RIGHT 08/22/2007  . HEADACHE 08/22/2007  . CALLUSES, FEET, BILATERAL 08/08/2007  . FOOT PAIN, BILATERAL 08/08/2007  . CANDIDIASIS 07/18/2007  . LUMBAGO 07/18/2007  . HELICOBACTER PYLORI GASTRITIS, HX OF 05/30/2007  . POSITIVE PPD 05/18/2007    [name], PT 05/11/2017, 12:47 PM  [place] [address] [place], [place], [address] Phone: [phone]   Fax:  [phone]  Name: Ronee Ranganathan MRN: [id number] Date of Birth: 08-Oct-1970

## 2017-05-13 ENCOUNTER — Encounter: Payer: Self-pay | Admitting: Physical Therapy

## 2017-05-13 ENCOUNTER — Ambulatory Visit: Payer: Medicaid Other | Admitting: Physical Therapy

## 2017-05-13 DIAGNOSIS — R293 Abnormal posture: Secondary | ICD-10-CM

## 2017-05-13 DIAGNOSIS — M5441 Lumbago with sciatica, right side: Secondary | ICD-10-CM

## 2017-05-13 DIAGNOSIS — M542 Cervicalgia: Secondary | ICD-10-CM

## 2017-05-13 DIAGNOSIS — M5442 Lumbago with sciatica, left side: Secondary | ICD-10-CM

## 2017-05-13 DIAGNOSIS — M25512 Pain in left shoulder: Secondary | ICD-10-CM

## 2017-05-13 DIAGNOSIS — M62838 Other muscle spasm: Secondary | ICD-10-CM

## 2017-05-13 NOTE — Therapy (Signed)
[address] [place], [place], [address] Phone: [phone]   Fax:  [phone]  Physical Therapy Treatment  Patient Details  Name: Kristin Joseph MRN: [id number] Date of Birth: January 01, 1971 Referring Provider: Kevan Ny   Encounter Date: 05/13/2017  PT End of Session - 05/13/17 1149    Visit Number  75    PT Start Time  1105    PT Stop Time  1204    PT Time Calculation (min)  59 min    Activity Tolerance  Patient tolerated treatment well    Behavior During Therapy  [facility] for tasks assessed/performed       Past Medical History:  Diagnosis Date  . Back pain   . Depression   . Fibromyalgia 10/25/2012  . Gastritis 06/28/2012   Mild  . Obesity   . Pelvic pain   . Stress headaches   . Vitamin D deficiency     Past Surgical History:  Procedure Laterality Date  . APPENDECTOMY    . FOOT SURGERY Bilateral    Plantar fasciitis  . THYROIDECTOMY, PARTIAL     pt denies    There were no vitals filed for this visit.  Subjective Assessment - 05/13/17 1144    Subjective  Patient was able to do more walking today.  She reports pain in the calves and legs especially hips.  She reports that she would like to continue until the end of May, but without interpreter it is hard to let her know that he need for skilled service is lacking especially iwth her saying the heat is what helps her most.    Currently in Pain?  Yes    Pain Score  4     Pain Location  Back                       [facility] Adult PT Treatment/Exercise - 05/13/17 0001      Ambulation/Gait   Gait Comments  3 laps around the building, she was very short of breath and had some c/o calf pain and tightness, we did not have to stop and rest      Lumbar Exercises: Stretches   Passive Hamstring Stretch  3 reps;20 seconds    Quad Stretch  3 reps;20 seconds    ITB Stretch  3 reps;20 seconds    Piriformis Stretch  3 reps;20 seconds      Lumbar Exercises: Aerobic   UBE (Upper Arm Bike)  level 3 x 6 minutes      Lumbar Exercises: Machines for Strengthening   Leg Press  20# 3x10      Lumbar Exercises: Standing   Other Standing Lumbar Exercises  2.5# hip extension, abduction and flexion      Moist Heat Therapy   Number Minutes Moist Heat  15 Minutes    Moist Heat Location  Cervical;Lumbar Spine               PT Short Term Goals - 03/16/17 1527      PT SHORT TERM GOAL #1   Title  She will be independent with intial HEP    Status  Partially Met        PT Long Term Goals - 05/13/17 1151      PT LONG TERM GOAL #1   Title  resume a walking program, or be able to use the gym at her apartment    Status  Partially Met  PT LONG TERM GOAL #5   Title  lumbar ROM will increase 50%    Status  Partially Met            Plan - 05/13/17 1150    Clinical Impression Statement  Patient able to tolerate the walking today but was very short of breath and also c/o calve and leg tightness at the end.  She again reports that the heat is what gives her the relief.  I have given her info about heat at home.    PT Next Visit Plan  we will work on strength and look to d/c in the next period    Consulted and Agree with Plan of Care  Patient       Patient will benefit from skilled therapeutic intervention in order to improve the following deficits and impairments:  Postural dysfunction, Decreased strength, Decreased activity tolerance, Pain, Increased muscle spasms, Decreased range of motion, Improper body mechanics, Impaired flexibility  Visit Diagnosis: Cervicalgia  Acute bilateral low back pain with bilateral sciatica  Abnormal posture  Other muscle spasm  Acute pain of left shoulder     Problem List Patient Active Problem List   Diagnosis Date Noted  . Hyperglycemia 03/17/2017  . Fibromyalgia 10/25/2012  . LLQ abdominal pain 09/08/2012  . Pelvic pain in female 10/02/2011  . Oligomenorrhea  05/20/2011  . VITAMIN D DEFICIENCY 02/15/2009  . OBESITY 06/07/2008  . ANEMIA 04/19/2008  . 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 FATIGUE 04/18/2008  . SKIN RASH 09/21/2007  . BREAST PAIN, BILATERAL 08/22/2007  . CALCANEAL SPUR, RIGHT 08/22/2007  . HEADACHE 08/22/2007  . CALLUSES, FEET, BILATERAL 08/08/2007  . FOOT PAIN, BILATERAL 08/08/2007  . CANDIDIASIS 07/18/2007  . LUMBAGO 07/18/2007  . HELICOBACTER PYLORI GASTRITIS, HX OF 05/30/2007  . POSITIVE PPD 05/18/2007    [name], PT 05/13/2017, 11:52 AM  [address] [place], [place], [address] Phone: [phone]   Fax:  [phone]  Name: Kristin Joseph MRN: [id number] Date of Birth: 12/08/1970

## 2017-05-17 ENCOUNTER — Ambulatory Visit: Payer: Medicaid Other

## 2017-05-18 ENCOUNTER — Encounter: Payer: Self-pay | Admitting: Physical Therapy

## 2017-05-18 ENCOUNTER — Ambulatory Visit: Payer: Medicaid Other | Admitting: Physical Therapy

## 2017-05-18 DIAGNOSIS — M542 Cervicalgia: Secondary | ICD-10-CM | POA: Diagnosis not present

## 2017-05-18 DIAGNOSIS — M62838 Other muscle spasm: Secondary | ICD-10-CM

## 2017-05-18 DIAGNOSIS — R293 Abnormal posture: Secondary | ICD-10-CM

## 2017-05-18 DIAGNOSIS — M5442 Lumbago with sciatica, left side: Secondary | ICD-10-CM

## 2017-05-18 DIAGNOSIS — M5441 Lumbago with sciatica, right side: Secondary | ICD-10-CM
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
## 2017-05-20 ENCOUNTER — Ambulatory Visit: Payer: Medicaid Other | Attending: Internal Medicine | Admitting: Physical Therapy

## 2017-05-20 DIAGNOSIS — M62838 Other muscle spasm: Secondary | ICD-10-CM | POA: Diagnosis present

## 2017-05-20 DIAGNOSIS — M25512 Pain in left shoulder: Secondary | ICD-10-CM | POA: Insufficient documentation

## 2017-05-20 DIAGNOSIS — M5441 Lumbago with sciatica, right side: Secondary | ICD-10-CM | POA: Diagnosis present

## 2017-05-20 DIAGNOSIS — M5442 Lumbago with sciatica, left side: Secondary | ICD-10-CM | POA: Diagnosis not present

## 2017-05-20 DIAGNOSIS — M542 Cervicalgia: Secondary | ICD-10-CM | POA: Insufficient documentation

## 2017-05-20 DIAGNOSIS — R293 Abnormal posture: Secondary | ICD-10-CM | POA: Diagnosis present

## 2017-05-20 NOTE — Therapy (Signed)
[address] [place], [place], [address] Phone: [phone]   Fax:  [phone]  Physical Therapy Treatment  Patient Details  Name: Kristin Joseph MRN: [id number] Date of Birth: 1970/07/27 Referring Provider: Kevan Ny   Encounter Date: 05/20/2017  PT End of Session - 05/20/17 1442    Visit Number  19    Date for PT Re-Evaluation  05/26/17    Authorization Type  Medicaid    PT Start Time  1442    PT Stop Time  1540 less 10 min for bathroom breaks    PT Time Calculation (min)  58 min       Past Medical History:  Diagnosis Date  . Back pain   . Depression   . Fibromyalgia 10/25/2012  . Gastritis 06/28/2012   Mild  . Obesity   . Pelvic pain   . Stress headaches   . Vitamin D deficiency     Past Surgical History:  Procedure Laterality Date  . APPENDECTOMY    . FOOT SURGERY Bilateral    Plantar fasciitis  . THYROIDECTOMY, PARTIAL     pt denies    There were no vitals filed for this visit.  Subjective Assessment - 05/20/17 1443    Subjective  Patient continues to c/o of urination problems.     Currently in Pain?  Yes    Pain Score  8     Pain Location  Back    Pain Orientation  Lower;Left    Pain Descriptors / Indicators  Tightness;Spasm                       [facility] Adult PT Treatment/Exercise - 05/20/17 0001      Lumbar Exercises: Aerobic   Stationary Bike  x 5 min      Lumbar Exercises: Machines for Strengthening   Leg Press  20# 1x10      Lumbar Exercises: Standing   Other Standing Lumbar Exercises  2 # hip extension, abduction and flexion x 30 ez      Moist Heat Therapy   Number Minutes Moist Heat  15 Minutes    Moist Heat Location  Lumbar Spine;Cervical               PT Short Term Goals - 03/16/17 1527      PT SHORT TERM GOAL #1   Title  She will be independent with intial HEP    Status  Partially Met        PT Long Term Goals - 05/18/17  1215      PT LONG TERM GOAL #1   Title  resume a walking program, or be able to use the gym at her apartment    Status  Partially Met            Plan - 05/20/17 1528    Clinical Impression Statement  Patient reports that PT helps, but the pain returns. She tolerated TE fairly well, but demonstrates core weakness with standing exercises and requires continual verbal cueing for correct form. She continues to reports incontinence problems. PT provided information to pt on women's health PTs at [facility].     PT Treatment/Interventions  Patient/family education;Passive range of motion;Manual techniques;Therapeutic exercise;Therapeutic activities;Electrical Stimulation;Moist Heat;Dry needling    PT Next Visit Plan  we will work on strength and look to d/c in the next week       Patient will benefit from skilled  therapeutic intervention in order to improve the following deficits and impairments:  Postural dysfunction, Decreased strength, Decreased activity tolerance, Pain, Increased muscle spasms, Decreased range of motion, Improper body mechanics, Impaired flexibility  Visit Diagnosis: Acute bilateral low back pain with bilateral sciatica  Cervicalgia     Problem List Patient Active Problem List   Diagnosis Date Noted  . Hyperglycemia 03/17/2017  . Fibromyalgia 10/25/2012  . LLQ abdominal pain 09/08/2012  . Pelvic pain in female 10/02/2011  . Oligomenorrhea 05/20/2011  . VITAMIN D DEFICIENCY 02/15/2009  . OBESITY 06/07/2008  . ANEMIA 04/19/2008  . 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 FATIGUE 04/18/2008  . SKIN RASH 09/21/2007  . BREAST PAIN, BILATERAL 08/22/2007  . CALCANEAL SPUR, RIGHT 08/22/2007  . HEADACHE 08/22/2007  . CALLUSES, FEET, BILATERAL 08/08/2007  . FOOT PAIN, BILATERAL 08/08/2007  . CANDIDIASIS 07/18/2007  . LUMBAGO 07/18/2007  . HELICOBACTER PYLORI GASTRITIS, HX OF 05/30/2007  . POSITIVE PPD 05/18/2007    Kristin Joseph PT 05/20/2017, 3:34 PM  [address] [place], [place], [address] Phone: [phone]   Fax:  [phone]  Name: Kristin Joseph MRN: [id number] Date of Birth: [birth year]

## 2017-05-21 ENCOUNTER — Ambulatory Visit: Payer: Medicaid Other | Admitting: Internal Medicine

## 2017-05-21 ENCOUNTER — Other Ambulatory Visit: Payer: Self-pay

## 2017-05-21 DIAGNOSIS — Z79899 Other long term (current) drug therapy: Secondary | ICD-10-CM

## 2017-05-21 DIAGNOSIS — N3941 Urge incontinence: Secondary | ICD-10-CM

## 2017-05-21 DIAGNOSIS — M545 Low back pain: Secondary | ICD-10-CM | POA: Diagnosis not present

## 2017-05-21 DIAGNOSIS — M797 Fibromyalgia: Secondary | ICD-10-CM

## 2017-05-21 DIAGNOSIS — Z87828 Personal history of other (healed) physical injury and trauma: Secondary | ICD-10-CM

## 2017-05-21 DIAGNOSIS — M5416 Radiculopathy, lumbar region: Secondary | ICD-10-CM | POA: Diagnosis not present

## 2017-05-21 MED ORDER — GABAPENTIN 100 MG PO CAPS: 200.0000 mg | ORAL_CAPSULE | Freq: Two times a day (BID) | ORAL | 2 refills | Status: DC | PRN

## 2017-05-21 MED ORDER — DULOXETINE HCL 60 MG PO CPEP: 60.0000 mg | ORAL_CAPSULE | Freq: Every day | ORAL | 0 refills | Status: DC

## 2017-05-21 MED ORDER — OXYBUTYNIN CHLORIDE 5 MG PO TABS: 5.0000 mg | ORAL_TABLET | Freq: Three times a day (TID) | ORAL | 2 refills | Status: DC
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
## 2017-05-21 NOTE — Patient Instructions (Addendum)
Kristin Joseph, I am sorry you are having this difficulty with urinary incontinence and back pain.  We will get some imaging of your lumbar spine to help evaluate your back pain, leg weakness and urinary incontinence.  For now we will give you a medicine called oxybutinin to help with your urinary symptoms.  I will increase the dose of your gabapentin as well.

## 2017-05-22 DIAGNOSIS — N3941 Urge incontinence: Secondary | ICD-10-CM | POA: Insufficient documentation
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
## 2017-05-25 ENCOUNTER — Ambulatory Visit: Payer: Medicaid Other | Admitting: Physical Therapy

## 2017-05-25 ENCOUNTER — Encounter: Payer: Self-pay | Admitting: Physical Therapy

## 2017-05-25 DIAGNOSIS — R293 Abnormal posture: Secondary | ICD-10-CM

## 2017-05-25 DIAGNOSIS — M5441 Lumbago with sciatica, right side: Secondary | ICD-10-CM

## 2017-05-25 DIAGNOSIS — M62838 Other muscle spasm: Secondary | ICD-10-CM

## 2017-05-25 DIAGNOSIS — M542 Cervicalgia: Secondary | ICD-10-CM

## 2017-05-25 DIAGNOSIS — M5442 Lumbago with sciatica, left side: Principal | ICD-10-CM

## 2017-05-25 DIAGNOSIS — M25512 Pain in left shoulder: Secondary | ICD-10-CM
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
## 2017-05-27 DIAGNOSIS — Z0271 Encounter for disability determination: Secondary | ICD-10-CM

## 2017-06-01 ENCOUNTER — Ambulatory Visit (HOSPITAL_COMMUNITY): Payer: Medicaid Other

## 2017-06-04 ENCOUNTER — Ambulatory Visit: Payer: Medicaid Other | Admitting: Physical Therapy

## 2017-06-09 ENCOUNTER — Ambulatory Visit: Payer: Medicaid Other | Attending: Internal Medicine | Admitting: Physical Therapy

## 2017-06-09 ENCOUNTER — Other Ambulatory Visit: Payer: Self-pay

## 2017-06-09 DIAGNOSIS — M62838 Other muscle spasm: Secondary | ICD-10-CM

## 2017-06-09 DIAGNOSIS — R293 Abnormal posture: Secondary | ICD-10-CM

## 2017-06-09 DIAGNOSIS — M6281 Muscle weakness (generalized): Secondary | ICD-10-CM | POA: Diagnosis present
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
## 2017-06-15 ENCOUNTER — Encounter (HOSPITAL_COMMUNITY): Payer: Self-pay | Admitting: Emergency Medicine

## 2017-06-15 ENCOUNTER — Ambulatory Visit (HOSPITAL_COMMUNITY)
Admission: EM | Admit: 2017-06-15 | Discharge: 2017-06-15 | Disposition: A | Discharge status: Home / Self Care | Payer: Medicaid Other | Attending: Family Medicine | Admitting: Family Medicine

## 2017-06-15 DIAGNOSIS — G44221 Chronic tension-type headache, intractable: Secondary | ICD-10-CM
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
## 2017-06-15 NOTE — ED Triage Notes (Signed)
PT reports headaches for 8 months. These have worsened. PT has a rash on scalp as well for 2 weeks.

## 2017-06-15 NOTE — Discharge Instructions (Signed)
Increase the gabapentin to 300 mg 2 - 3 times a day Take the tramadol when pain severe I will send a note to your family doctor about referrals We can not do your referrals from the urgent care center

## 2017-06-18 ENCOUNTER — Ambulatory Visit: Payer: Medicaid Other

## 2017-06-23 ENCOUNTER — Ambulatory Visit: Payer: Medicaid Other | Admitting: Physical Therapy

## 2017-06-24 ENCOUNTER — Ambulatory Visit: Payer: Medicaid Other | Attending: Internal Medicine | Admitting: Physical Therapy

## 2017-06-24 ENCOUNTER — Encounter

## 2017-06-24 DIAGNOSIS — R293 Abnormal posture: Secondary | ICD-10-CM | POA: Insufficient documentation

## 2017-06-24 DIAGNOSIS — M6281 Muscle weakness (generalized): Secondary | ICD-10-CM | POA: Insufficient documentation

## 2017-06-24 DIAGNOSIS — M62838 Other muscle spasm: Secondary | ICD-10-CM | POA: Insufficient documentation

## 2017-06-25 ENCOUNTER — Encounter: Payer: Self-pay | Admitting: Internal Medicine

## 2017-06-25 ENCOUNTER — Other Ambulatory Visit: Payer: Self-pay

## 2017-06-25 ENCOUNTER — Ambulatory Visit (INDEPENDENT_AMBULATORY_CARE_PROVIDER_SITE_OTHER): Payer: Medicaid Other | Admitting: Internal Medicine

## 2017-06-25 VITALS — BP 100/60 | HR 81 | Temp 98.2°F | Ht 66.0 in | Wt 179.8 lb

## 2017-06-25 DIAGNOSIS — R7982 Elevated C-reactive protein (CRP): Secondary | ICD-10-CM

## 2017-06-25 DIAGNOSIS — Z79899 Other long term (current) drug therapy: Secondary | ICD-10-CM | POA: Diagnosis not present

## 2017-06-25 DIAGNOSIS — M797 Fibromyalgia: Secondary | ICD-10-CM | POA: Diagnosis not present

## 2017-06-25 DIAGNOSIS — R51 Headache: Secondary | ICD-10-CM | POA: Diagnosis present

## 2017-06-25 DIAGNOSIS — F329 Major depressive disorder, single episode, unspecified: Secondary | ICD-10-CM | POA: Diagnosis not present

## 2017-06-25 MED ORDER — GABAPENTIN 300 MG PO CAPS: 300.0000 mg | ORAL_CAPSULE | Freq: Three times a day (TID) | ORAL | 0 refills | Status: DC

## 2017-06-25 MED ORDER — DULOXETINE HCL 60 MG PO CPEP: 60.0000 mg | ORAL_CAPSULE | Freq: Every day | ORAL | 0 refills | Status: DC

## 2017-06-25 NOTE — Progress Notes (Signed)
Internal Medicine Clinic Attending  Case discussed with Dr. Huang at the time of the visit.  We reviewed the resident's history and exam and pertinent patient test results.  I agree with the assessment, diagnosis, and plan of care documented in the resident's note. 

## 2017-06-25 NOTE — Patient Instructions (Addendum)
FOLLOW-UP INSTRUCTIONS When: 07/30/2017 - with PCP For: follow up of fibromyalgia What to bring: medications   Ms. Kristin Joseph,  It was nice to meet you today.  For the neck pain and headaches, I am sending a referral for therapy that help you control the pain symptoms. We are also making some changes to your pain medicines, as some of these can actually worsen your headaches and I do not want you to be too sleepy. - Please STOP taking indomethacin and flexeril. - Continue to take duloxetine 60mg  daily. - At your next visits, we will continue to work on trying to decrease your other pain medicines. - Please try to stay active, as exercise is helpful for making the pain better.  Please schedule an appointment to see your primary doctor on July 12.  If you have any questions or concerns, call our clinic at [phone] or after hours call [phone] and ask for the internal medicine resident on call.

## 2017-06-25 NOTE — Assessment & Plan Note (Addendum)
Assessment  Orthostatics were checked in clinic, which were negative, confirming that the shoulder pain with standing is not related to orthostatic hypotension.  I suspect her symptoms are related to fibromyalgia. She requests a referral to Pain Clinic, however I am not sure that this would provide any benefit. Opioids and NSAIDs are not indicated in fibromyalgia and thus we will work to discontinue these medications. She also has two muscle relaxers on her medication list, will discontinue one today and hopefully discontinue the other at her next visit. She is on duloxetine and gabapentin, however reports she was only taking gabapentin 160m three times a day - will increase this today. She also would benefit from regular PCP visits, as well as CBT and regular exercise.  She did previously have slightly elevated inflammatory markers without an etiology. CK, RF, CCP, ANA, TSH, SS-A, SS-B, vitamin D, and A1c were normal. Will recheck these today.  Plan - Continue duloxetine 663mdaily - Change to gabapentin 30055mID - Referral to [facility] - Discontinue flexeril and indomethacin - F/u with PCP in ~1 month - Recommend regular appointments with PCP and trying to discontinue naproxen, robaxin, and tramadol at some point during next visits  ADDENDUM: CRP elevated to 12.8, with normal ESR. Unclear etiology for discordant acute phase reactant values. She has had recent autoimmune work-up without an etiology. No clear evidence of infectious source to explain elevated inflammatory marker. Patient will continue regular follow up with PCP.

## 2017-06-25 NOTE — Progress Notes (Signed)
   CC: headache  HPI:  [name] is a 47 y.o. female with PMH of fibromyalgia, stress headaches, and depression who presents with headache.  She went to Urgent Care on 5/28 due to headaches. This was thought to be tension headaches and she requested a referral at that time. She was advised that Urgent Care does not send referrals and thus instructed to be evaluated by her PCP. Her gabapentin was also increased to 300mg  2-3 times daily and given tramadol with severe pain.  Since then, she reports she has continued to have posterior cervical neck pain and headaches. She actually tells me that she has had this pain for at least 6 months. She states she was previously supposed to be referred to a pain clinic, but never went because she was given some medication, which helped with the pain. She endorses pain that worsens any time she gets up to do anything and the pain is relieved when she lies down. She takes indomethacin, naproxen, tramadol, robaxin, gabapentin, and duloxetine, all of which provide some benefit, but make her tired.  Past Medical History:  Diagnosis Date  . Back pain   . Depression   . Fibromyalgia 10/25/2012  . Gastritis 06/28/2012   Mild  . Obesity   . Pelvic pain   . Stress headaches   . Vitamin D deficiency    Review of Systems:   CV: Negative for chest pain  PULM: Negative for SOB EXT: Negative for LE swelling MSK: Positive for posterior neck pain and headache  Physical Exam:  Vitals:   06/25/17 1428  BP: 100/60  Pulse: 81  Temp: 98.2 F (36.8 C)  TempSrc: Oral  SpO2: 99%  Weight: 179 lb 12.8 oz (81.6 kg)  Height: 5\' 6"  (1.676 m)   GEN: Lying in bed in NAD EYES: PERRL. EOMI intact MSK: Tenderness to palpation over multiple sites along back, posterior neck, trapezius, and posterior head SKIN: No rashes evident CV: NR & RR, no m/r/g PULM: CTAB, no wheezes or rales EXT: No LE edema PSYCH: Appears anxious  Assessment & Plan:   See Encounters  Tab for problem based charting.  Patient discussed with Dr. Lynnae January

## 2017-06-26 LAB — SEDIMENTATION RATE: SED RATE: 19 mm/h (ref 0–32)

## 2017-06-26 LAB — C-REACTIVE PROTEIN: CRP: 12.8 mg/L — AB (ref 0.0–4.9)

## 2017-06-28 ENCOUNTER — Encounter: Payer: Self-pay | Admitting: Physical Therapy

## 2017-06-28 ENCOUNTER — Ambulatory Visit: Payer: Medicaid Other | Admitting: Physical Therapy

## 2017-06-28 DIAGNOSIS — M62838 Other muscle spasm: Secondary | ICD-10-CM

## 2017-06-28 DIAGNOSIS — R293 Abnormal posture: Secondary | ICD-10-CM

## 2017-06-28 DIAGNOSIS — M6281 Muscle weakness (generalized): Secondary | ICD-10-CM | POA: Diagnosis present

## 2017-06-28 NOTE — Therapy (Signed)
[facility] [address] [place], [place], [address] Phone: [phone]   Fax:  [phone]  Physical Therapy Treatment  Patient Details  Name: Kristin Joseph MRN: [id number] Date of Birth: [birth year] Referring Provider: Katherine Roan   Encounter Date: 06/28/2017  PT End of Session - 06/28/17 1457    Visit Number  2    Date for PT Re-Evaluation  09/01/17    Authorization Type  Medicaid    PT Start Time  1457 12 min late    PT Stop Time  1529    PT Time Calculation (min)  32 min    Activity Tolerance  Patient tolerated treatment well    Behavior During Therapy  [facility] for tasks assessed/performed       Past Medical History:  Diagnosis Date  . Back pain   . Depression   . Fibromyalgia 10/25/2012  . Gastritis 06/28/2012   Mild  . Obesity   . Pelvic pain   . Stress headaches   . Vitamin D deficiency     Past Surgical History:  Procedure Laterality Date  . APPENDECTOMY    . FOOT SURGERY Bilateral    Plantar fasciitis  . THYROIDECTOMY, PARTIAL     pt denies    There were no vitals filed for this visit.  Subjective Assessment - 06/28/17 1506    Subjective  I have to go to the bathroom  several times each time I have to go to the bathroom.  I just got up and down 4x each time I touch the door.  No pain reported currently (at rest).    Currently in Pain?  No/denies                       [facility] Adult PT Treatment/Exercise - 06/28/17 0001      Self-Care   Other Self-Care Comments   educated on abdominal massage, toileting techniques      Neuro Re-ed    Neuro Re-ed Details   diaphragmatic breathing in sitting and supine      Manual Therapy   Manual therapy comments  abdominal massage for demonstration of techniques             PT Education - 06/28/17 1719    Education Details  toilet techniques and urge to void    Person(s) Educated  Patient    Methods   Explanation;Demonstration;Verbal cues;Handout    Comprehension  Verbalized understanding;Returned demonstration          PT Long Term Goals - 06/28/17 1720      PT LONG TERM GOAL #1   Title  pt will report 50% less urine leakage during typical day    Baseline  urine leaks at least 8x/day    Status  On-going      PT LONG TERM GOAL #2   Title  pt will have nocturia 2x/night at most    Baseline  4x    Status  On-going      PT LONG TERM GOAL #3   Title  pt will have improved muscle coordination in order to have bowel movement without straining    Baseline  unable    Status  On-going      PT LONG TERM GOAL #4   Title  Pt will report being able to empty bladder in one sitting without having to leave and come right back due to improved ability to relax pelvic floor correctly.  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  Pt reports to PT for first treatment since evaluation.  Pt has not made progress due to initial treatment since evaluation and complex clinical presentation due to multiple body parts and systems involved.  Pt was educated in diaphragmatic breathing, toileting techniques and urge to void.  Due to limited amount of time of today's session, patient will most likely need to review in order to ensure full understanding of these techniques.  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    PT Treatment/Interventions  Patient/family education;Passive range of motion;Manual techniques;Therapeutic exercise;Therapeutic activities;Electrical Stimulation;Moist Heat;Dry needling;ADLs/Self Care Home  Management;Biofeedback;Neuromuscular re-education;Taping    PT Next Visit Plan  STM, toileting techniques, biofeedback    Consulted and Agree with Plan of Care  Patient       Patient will benefit from skilled therapeutic intervention in order to improve the following deficits and impairments:  Postural dysfunction, Decreased strength, Decreased activity tolerance, Pain, Increased muscle spasms, Decreased range of motion, Improper body mechanics, Impaired flexibility, Decreased coordination  Visit Diagnosis: Muscle weakness (generalized)  Other muscle spasm  Abnormal posture     Problem List Patient Active Problem List   Diagnosis Date Noted  . Urge incontinence of urine 05/22/2017  . Hyperglycemia 03/17/2017  . Fibromyalgia 10/25/2012  . LLQ abdominal pain 09/08/2012  . Pelvic pain in female 10/02/2011  . Oligomenorrhea 05/20/2011  . VITAMIN D DEFICIENCY 02/15/2009  . OBESITY 06/07/2008  . ANEMIA 04/19/2008  . 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 FATIGUE 04/18/2008  . SKIN RASH 09/21/2007  . BREAST PAIN, BILATERAL 08/22/2007  . CALCANEAL SPUR, RIGHT 08/22/2007  . HEADACHE 08/22/2007  . CALLUSES, FEET, BILATERAL 08/08/2007  . FOOT PAIN, BILATERAL 08/08/2007  . CANDIDIASIS 07/18/2007  . LUMBAGO 07/18/2007  . HELICOBACTER PYLORI GASTRITIS, HX OF 05/30/2007  . POSITIVE PPD 05/18/2007    Zannie Cove, PT 06/28/2017, 5:44 PM  [facility] [address], [place], [place], [address] Phone: [phone]   Fax:  [phone]  Name: Rokia Bosket MRN: [id number] Date of Birth: 12/09/1970

## 2017-06-28 NOTE — Patient Instructions (Addendum)
This meditation is from [employer], physical therapist and yoga instructor.  Patients, yoga students and pelvic health practitioners that I have shared this with have seemed to really enjoy it, find it easy to practice and teach, and report its usefulness! It involves 6 stages, using the acronym "AIRBAG" to help you remember! If you are sitting on the toilet, it is a good idea to place your feet up on some blocks so that your knees are slightly higher than your hips. This position can help enhance your PFMs to relax and allow for proper elimination, particularly for bowel movements. If you are standing during urination, these toilet meditation stages of "AIRBAG" can still be performed: A = Awareness: become present and connected to your body as best as you can. A brief body scan from head to toe simply observing sensations that you may be experiencing, without judgement, both internally (interoceptive awareness) and externally (such as the sensations at the soles of the feet weight bearing on the supporting surface, or sensations at the thighs as they weight bear on the toilet seat, or how you are holding your arms, or any tension in the jaw or shoulders). You may include awareness of thoughts and emotions, without elaborating on a story or analyzing. I = Imagination: use your visualization skills to imagine your pelvic floor and the general area of attachments of the PFMs to the inside of the front, sides and back of the pelvis, the tailbone and sacrum. Visualize where the bladder and bowel are positioned and imagine them emptying and that the PFMs spanning across the pelvic floor are healthy and functioning optimally. R = Release & Relax: let go of any tension in the PFMs as best as you can. Releasing and relaxing these muscles can sometimes be difficult for a variety of reasons. Sometimes 'trying too hard' to relax and let go creates even more tension. Be patient and compassionate towards yourself if you  have trouble with this. Letting go often takes courage, trust, concentration and practise. B = Breathe: allow your natural breath pattern to emerge. Sometimes when we try to breathe, we create more tension that results in unnatural patterns that do not serve a relaxed state. As you quietly inhale, the belly will naturally protrude outwards or forward and the pelvic floor will descend. As you exhale, the belly and PFMs will return to their resting positions. During toileting, see if you can simply allow the quiet rhythm of the abdomino-pelvic diaphragmatic breath to happen on its own without trying to change it. A = Allow: this 'allowing' stage is a little more than just releasing and relaxing or allowing the breath to happen on its own. See if you can really give yourself permission to trust that your body knows what to do and when to do it. Perhaps you feel the need to push gently (do not strain) or you feel like you want to take a deep breath, sigh out loud, lean forward, or place your feet in a different position. The more refined your awareness skills are, the more you can trust what feels right, and not always what you think you should do. G = Gratitude: I think it is a healthy practice to not only be completely present and mindful when toileting, but to also honour this sophisticated and truly complex function that our body does for [place] on a daily basis without [place] even asking it to. So each time you complete your toileting event, I invite you to send a  little gratitude to your body and all its incredibly phenomenal parts to end your toilet meditation ! There is a FREE bonus feature of the full guided Toilet Meditation (with music and breathtaking cinematography) included along with the "Creating Pelvic Floor Health with PhysioYoga" practice sessions which consist of a combination of physical therapy based exercises and yoga methods targeted to optimize pelvic floor health. I also have a free brief segment  of the meditation on my YouTube channel.  Toileting Techniques for Bowel Movements (Defecation) Using your belly (abdomen) and pelvic floor muscles to have a bowel movement is usually instinctive.  Sometimes people can have problems with these muscles and have to relearn proper defecation (emptying) techniques.  If you have weakness in your muscles, organs that are falling out, decreased sensation in your pelvis, or ignore your urge to go, you may find yourself straining to have a bowel movement.  You are straining if you are: . holding your breath or taking in a huge gulp of air and holding it  . keeping your lips and jaw tensed and closed tightly . turning red in the face because of excessive pushing or forcing . developing or worsening your  hemorrhoids . getting faint while pushing . not emptying completely and have to defecate many times a day  If you are straining, you are actually making it harder for yourself to have a bowel movement.  Many people find they are pulling up with the pelvic floor muscles and closing off instead of opening the anus. Due to lack pelvic floor relaxation and coordination the abdominal muscles, one has to work harder to push the feces out.  Many people have never been taught how to defecate efficiently and effectively.  Notice what happens to your body when you are having a bowel movement.  While you are sitting on the toilet pay attention to the following areas: . Jaw and mouth position . Angle of your hips   . Whether your feet touch the ground or not . Arm placement  . Spine position . Waist . Belly tension . Anus (opening of the anal canal)  An Evacuation/Defecation Plan   Here are the 4 basic points:  1. Lean forward enough for your elbows to rest on your knees 2. Support your feet on the floor or use a low stool if your feet don't touch the floor  3. Push out your belly as if you have swallowed a beach ball-you should feel a widening of your  waist 4. Open and relax your pelvic floor muscles, rather than tightening around the anus      The following conditions my require modifications to your toileting posture:  . If you have had surgery in the past that limits your back, hip, pelvic, knee or ankle flexibility . Constipation   Your healthcare practitioner may make the following additional suggestions and adjustments:  1) Sit on the toilet  a) Make sure your feet are supported. b) Notice your hip angle and spine position-most people find it effective to lean forward or raise their knees, which can help the muscles around the anus to relax  c) When you lean forward, place your forearms on your thighs for support  2) Relax suggestions a) Breath deeply in through your nose and out slowly through your mouth as if you are smelling the flowers and blowing out the candles. b) To become aware of how to relax your muscles, contracting and releasing muscles can be helpful.  Pull your  pelvic floor muscles in tightly by using the image of holding back gas, or closing around the anus (visualize making a circle smaller) and lifting the anus up and in.  Then release the muscles and your anus should drop down and feel open. Repeat 5 times ending with the feeling of relaxation. c) Keep your pelvic floor muscles relaxed; let your belly bulge out. d) The digestive tract starts at the mouth and ends at the anal opening, so be sure to relax both ends of the tube.  Place your tongue on the roof of your mouth with your teeth separated.  This helps relax your mouth and will help to relax the anus at the same time.  3) Empty (defecation) a) Keep your pelvic floor and sphincter relaxed, then bulge your anal muscles.  Make the anal opening wide.  b) Stick your belly out as if you have swallowed a beach ball. c) Make your belly wall hard using your belly muscles while continuing to breathe. Doing this makes it easier to open your anus. d) Breath out and  give a grunt (or try using other sounds such as ahhhh, shhhhh, ohhhh or grrrrrrr).  4) Finish a) As you finish your bowel movement, pull the pelvic floor muscles up and in.  This will leave your anus in the proper place rather than remaining pushed out and down. If you leave your anus pushed out and down, it will start to feel as though that is normal and give you incorrect signals about needing to have a bowel movement.   [facility] [place], [place] [address]  Relaxation Exercises with the Urge to Void   When you experience an urge to void:  FIRST  Stop and stand very still    Sit down if you can    Don't move    You need to stay very still to maintain control  SECOND Squeeze your pelvic floor muscles 5 times, like a quick flick, to keep from leaking  THIRD Relax  Take a deep breath and then let it out  Try to make the urge go away by using relaxation and visualization techniques  FINALLY When you feel the urge go away somewhat, walk normally to the bathroom.   If the urge gets suddenly stronger on the way, you may stop again and relax to regain control.  [facility] [address] [place], [place] [address] Phone # [phone] Fax [phone]

## 2017-07-01 ENCOUNTER — Encounter: Payer: Medicaid Other | Admitting: Physical Therapy

## 2017-07-02 ENCOUNTER — Encounter: Payer: Medicaid Other | Admitting: Physical Therapy

## 2017-07-13 ENCOUNTER — Telehealth (HOSPITAL_COMMUNITY): Payer: Self-pay | Admitting: Internal Medicine

## 2017-07-13 ENCOUNTER — Ambulatory Visit: Payer: Medicaid Other | Admitting: Physical Therapy

## 2017-07-14 ENCOUNTER — Encounter: Payer: Self-pay | Admitting: Physical Therapy

## 2017-07-14 ENCOUNTER — Encounter: Payer: Self-pay | Admitting: *Deleted

## 2017-07-14 ENCOUNTER — Ambulatory Visit: Payer: Medicaid Other

## 2017-07-14 ENCOUNTER — Ambulatory Visit (HOSPITAL_COMMUNITY): Payer: Medicaid Other

## 2017-07-14 ENCOUNTER — Ambulatory Visit: Payer: Medicaid Other | Admitting: Physical Therapy

## 2017-07-14 DIAGNOSIS — R293 Abnormal posture: Secondary | ICD-10-CM

## 2017-07-14 DIAGNOSIS — M6281 Muscle weakness (generalized): Secondary | ICD-10-CM

## 2017-07-14 DIAGNOSIS — M62838 Other muscle spasm: Secondary | ICD-10-CM

## 2017-07-14 NOTE — Therapy (Signed)
[facility] [address], [place], [place], [address] Phone: [phone]   Fax:  [phone]  Physical Therapy Treatment  Patient Details  Name: Kristin Joseph MRN: [id number] Date of Birth: 11/28/1970 Referring Provider: Katherine Roan   Encounter Date: 07/14/2017  PT End of Session - 07/14/17 1700    Visit Number  3    Date for PT Re-Evaluation  09/01/17    Authorization Type  Medicaid 12 visits until 09/26/17    Authorization - Visit Number  1    Authorization - Number of Visits  12    PT Start Time  3151    PT Stop Time  1711    PT Time Calculation (min)  48 min    Activity Tolerance  Patient limited by pain       Past Medical History:  Diagnosis Date  . Back pain   . Depression   . Fibromyalgia 10/25/2012  . Gastritis 06/28/2012   Mild  . Obesity   . Pelvic pain   . Stress headaches   . Vitamin D deficiency     Past Surgical History:  Procedure Laterality Date  . APPENDECTOMY    . FOOT SURGERY Bilateral    Plantar fasciitis  . THYROIDECTOMY, PARTIAL     pt denies    There were no vitals filed for this visit.  Subjective Assessment - 07/14/17 1653    Subjective  Pt arrived late and had to go to the bathroom immediately.  She was walking with antalgic gait on left side.  Pt states her sciatica is always hurting                       [facility] Adult PT Treatment/Exercise - 07/14/17 0001      Lumbar Exercises: Supine   Ab Set  20 reps with pelvic contraction; TrA contraction with breathing    Pelvic Tilt  20 reps with and without kegel    Bent Knee Raise  10 reps TrA contraction    Other Supine Lumbar Exercises  ball squeeze with kegel - 4 sec hold x 10      Knee/Hip Exercises: Stretches   Hip Flexor Stretch  Limitations    Hip Flexor Stretch Limitations  educated and demo             PT Education - 07/14/17 1709    Education Details   Access Code: [email or web address]     Person(s) Educated  Patient    Methods  Explanation;Demonstration;Handout    Comprehension  Verbalized understanding;Returned demonstration          PT Long Term Goals - 06/28/17 1720      PT LONG TERM GOAL #1   Title  pt will report 50% less urine leakage during typical day    Baseline  urine leaks at least 8x/day    Status  On-going      PT LONG TERM GOAL #2   Title  pt will have nocturia 2x/night at most    Baseline  4x    Status  On-going      PT LONG TERM GOAL #3   Title  pt will have improved muscle coordination in order to have bowel movement without straining    Baseline  unable    Status  On-going      PT LONG TERM GOAL #4   Title  Pt will report being able to empty bladder in one sitting without having  to leave and come right back due to improved ability to relax pelvic floor correctly.    Baseline  gets up and down 4x or more    Status  On-going      PT LONG TERM GOAL #5   Title  pt will be ind with HEP for maintenance of strength    Baseline  does not know    Status  On-going            Plan - 07/14/17 1713    Clinical Impression Statement  Pt was very frantic throughout the session today.  Pt needed a lot of cues for the same exercise in order to rember the directions.  For example, when doing pelvic contraction wihtout pelvic tilt, she was able to do with tactile cues, but continues to intermittently do pelvic tilts.  Pt was able to contract and bulge with tactile cues.  Pt demonstrates need for skilled PT in order to address soft tissue, ROM, coordination impairments.  Pt needs extra time due to difficulty processing information.      PT Treatment/Interventions  Patient/family education;Passive range of motion;Manual techniques;Therapeutic exercise;Therapeutic activities;Electrical Stimulation;Moist Heat;Dry needling;ADLs/Self Care Home Management;Biofeedback;Neuromuscular re-education;Taping    PT Next Visit Plan  pelvic floor meditation, lumbar ROM, hip  flexor stretch, biofeedback    PT Home Exercise Plan  Access Code: [email or web address]    Recommended Other Services  cert signed    Consulted and Agree with Plan of Care  Patient       Patient will benefit from skilled therapeutic intervention in order to improve the following deficits and impairments:  Postural dysfunction, Decreased strength, Decreased activity tolerance, Pain, Increased muscle spasms, Decreased range of motion, Improper body mechanics, Impaired flexibility, Decreased coordination  Visit Diagnosis: Muscle weakness (generalized)  Other muscle spasm  Abnormal posture     Problem List Patient Active Problem List   Diagnosis Date Noted  . Urge incontinence of urine 05/22/2017  . Hyperglycemia 03/17/2017  . Fibromyalgia 10/25/2012  . LLQ abdominal pain 09/08/2012  . Pelvic pain in female 10/02/2011  . Oligomenorrhea 05/20/2011  . VITAMIN D DEFICIENCY 02/15/2009  . OBESITY 06/07/2008  . ANEMIA 04/19/2008  . 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 FATIGUE 04/18/2008  . SKIN RASH 09/21/2007  . BREAST PAIN, BILATERAL 08/22/2007  . CALCANEAL SPUR, RIGHT 08/22/2007  . HEADACHE 08/22/2007  . CALLUSES, FEET, BILATERAL 08/08/2007  . FOOT PAIN, BILATERAL 08/08/2007  . CANDIDIASIS 07/18/2007  . LUMBAGO 07/18/2007  . HELICOBACTER PYLORI GASTRITIS, HX OF 05/30/2007  . POSITIVE PPD 05/18/2007    Zannie Cove, PT 07/14/2017, 5:29 PM  [facility] [address] [place], [place], [address] Phone: [phone]   Fax:  [phone]  Name: Amiliana Foutz MRN: [id number] Date of Birth: 1971/01/13

## 2017-07-14 NOTE — Patient Instructions (Signed)
Access Code: [email or web address]  URL: [email or web address]  Date: 07/14/2017  Prepared by: Lovett Calender   Exercises  Hooklying Small March - 10 reps - 1 sets - 3 sec hold - 2x daily - 7x weekly  Supine Hip Adduction Isometric with Ball - 10 reps - 1 sets - 3 sec hold - 2x daily - 7x weekly  Standing Hip Flexor Stretch - 2 reps - 1 sets - 30 sec hold - 3x daily - 7x weekly   [facility] [address], [place], [place] [address] Phone # [phone] Fax [phone]

## 2017-07-15 ENCOUNTER — Ambulatory Visit: Payer: Medicaid Other

## 2017-07-17 ENCOUNTER — Other Ambulatory Visit: Payer: Self-pay | Admitting: Internal Medicine

## 2017-07-17 DIAGNOSIS — M797 Fibromyalgia: Secondary | ICD-10-CM

## 2017-07-19 NOTE — Telephone Encounter (Signed)
Next appt scheduled  7/8 with PCP.

## 2017-07-23 NOTE — Progress Notes (Deleted)
   CC: Fibromyalgia follow up  HPI:  Kristin Joseph is a 47 y.o. female with fibromyalgia, major depressive disorder, and vitamin d deficiency who presents for fibromyalgia follow up. Please see problem based charting for evaluation, assessment, and plan.   Past Medical History:  Diagnosis Date  . Back pain   . Depression   . Fibromyalgia 10/25/2012  . Gastritis 06/28/2012   Mild  . Obesity   . Pelvic pain   . Stress headaches   . Vitamin D deficiency    Review of Systems:  ***  Physical Exam:  There were no vitals filed for this visit. ***  Assessment & Plan:   See Encounters Tab for problem based charting.  Fibromyalgia  The patient has negative ANA, rheumatoid factor, and ccp  Assessment and plan -Discontinue muscle relaxer -Gabapentin 371m tid  -Behavioral health for Cognitive behavioral therapy  -Duloxetine 630mqd -Discontinue robaxin, naproxen, tramadol  ***elevated esr and crp   Patient {GC/GE:3044014::"discussed with","seen with"} Dr. {NAMES:3044014::"Butcher","Granfortuna","E. Hoffman","Klima","Mullen","Narendra","Raines","Vincent"}

## 2017-07-26 ENCOUNTER — Encounter: Payer: Medicaid Other | Admitting: Internal Medicine

## 2017-07-30 ENCOUNTER — Encounter: Payer: Medicaid Other | Admitting: Internal Medicine

## 2017-08-05 ENCOUNTER — Ambulatory Visit: Payer: Medicaid Other | Attending: Internal Medicine | Admitting: Physical Therapy

## 2017-08-05 ENCOUNTER — Telehealth: Payer: Self-pay | Admitting: Physical Therapy

## 2017-08-05 DIAGNOSIS — M5441 Lumbago with sciatica, right side: Secondary | ICD-10-CM | POA: Insufficient documentation

## 2017-08-05 DIAGNOSIS — M6281 Muscle weakness (generalized): Secondary | ICD-10-CM | POA: Insufficient documentation

## 2017-08-05 DIAGNOSIS — M62838 Other muscle spasm: Secondary | ICD-10-CM | POA: Insufficient documentation

## 2017-08-05 DIAGNOSIS — R293 Abnormal posture: Secondary | ICD-10-CM | POA: Insufficient documentation

## 2017-08-05 DIAGNOSIS — M5442 Lumbago with sciatica, left side: Secondary | ICD-10-CM | POA: Insufficient documentation

## 2017-08-05 NOTE — Telephone Encounter (Signed)
Patient did not show for appointment.  Patient was called and PT left message to please call us back.  Zannie Cove, PT 08/05/17 3:14 PM

## 2017-08-10 ENCOUNTER — Encounter: Payer: Self-pay | Admitting: Physical Therapy

## 2017-08-10 ENCOUNTER — Ambulatory Visit: Payer: Medicaid Other | Admitting: Physical Therapy

## 2017-08-10 DIAGNOSIS — R293 Abnormal posture: Secondary | ICD-10-CM | POA: Diagnosis present

## 2017-08-10 DIAGNOSIS — M5442 Lumbago with sciatica, left side: Secondary | ICD-10-CM

## 2017-08-10 DIAGNOSIS — M5441 Lumbago with sciatica, right side: Secondary | ICD-10-CM

## 2017-08-10 DIAGNOSIS — M6281 Muscle weakness (generalized): Secondary | ICD-10-CM

## 2017-08-10 DIAGNOSIS — M62838 Other muscle spasm: Secondary | ICD-10-CM

## 2017-08-10 NOTE — Therapy (Signed)
[facility] [address], [place], [place], [address] Phone: [phone]   Fax:  [phone]  Physical Therapy Treatment  Patient Details  Name: Kristin Joseph MRN: [id number] Date of Birth: 1970-11-03 Referring Provider: Katherine Roan   Encounter Date: 08/10/2017  PT End of Session - 08/10/17 1710    Visit Number  4    Date for PT Re-Evaluation  09/01/17    Authorization Type  Medicaid 12 visits until 09/26/17    Authorization - Visit Number  2    Authorization - Number of Visits  12    PT Start Time  1505    PT Stop Time  3244    PT Time Calculation (min)  25 min       Past Medical History:  Diagnosis Date  . Back pain   . Depression   . Fibromyalgia 10/25/2012  . Gastritis 06/28/2012   Mild  . Obesity   . Pelvic pain   . Stress headaches   . Vitamin D deficiency     Past Surgical History:  Procedure Laterality Date  . APPENDECTOMY    . FOOT SURGERY Bilateral    Plantar fasciitis  . THYROIDECTOMY, PARTIAL     pt denies    There were no vitals filed for this visit.  Subjective Assessment - 08/10/17 1635    Subjective  Pt arrived late but able to wait until the end of session to go to the bathroom    Currently in Pain?  Yes    Pain Score  -- unable to provide number                       [facility] Adult PT Treatment/Exercise - 08/10/17 0001      Self-Care   Other Self-Care Comments   educated and performed body scanning with breathing and added to HEP      Neuro Re-ed    Neuro Re-ed Details   diaphragmatic breathing with abdominals engaged      Lumbar Exercises: Supine   Ab Set  20 reps TrA and UE flexion movments    Other Supine Lumbar Exercises  cervical nodding with TrA activation             PT Education - 08/10/17 1710    Education Details  breathing and body scanning x 5 min/ day    Person(s) Educated  Patient    Methods  Explanation;Demonstration    Comprehension  Verbalized understanding;Returned demonstration          PT Long Term Goals - 06/28/17 1720      PT LONG TERM GOAL #1   Title  pt will report 50% less urine leakage during typical day    Baseline  urine leaks at least 8x/day    Status  On-going      PT LONG TERM GOAL #2   Title  pt will have nocturia 2x/night at most    Baseline  4x    Status  On-going      PT LONG TERM GOAL #3   Title  pt will have improved muscle coordination in order to have bowel movement without straining    Baseline  unable    Status  On-going      PT LONG TERM GOAL #4   Title  Pt will report being able to empty bladder in one sitting without having to leave and come right back due to improved ability to relax  pelvic floor correctly.    Baseline  gets up and down 4x or more    Status  On-going      PT LONG TERM GOAL #5   Title  pt will be ind with HEP for maintenance of strength    Baseline  does not know    Status  On-going            Plan - 08/10/17 1641    Clinical Impression Statement  Pt was able to hold bladder until after session today and responded well to breathing with body scanning for relaxation technique to begin session.  Pt has difficutly focusing throughout treatment and needs a lot of cues to redirect her to the exercise.  She is highly focused on pain and is easily distressed, but able to calm down for brief periods of time when she is redirected.  Pt will benefit from skilled PT to work on pain and muscle control for improved functional actvities.    PT Treatment/Interventions  Patient/family education;Passive range of motion;Manual techniques;Therapeutic exercise;Therapeutic activities;Electrical Stimulation;Moist Heat;Dry needling;ADLs/Self Care Home Management;Biofeedback;Neuromuscular re-education;Taping    PT Next Visit Plan  pelvic floor meditation and body scanning to warm up, lumbar ROM, hip flexor stretch, biofeedback    Consulted and Agree with Plan of  Care  Patient       Patient will benefit from skilled therapeutic intervention in order to improve the following deficits and impairments:  Postural dysfunction, Decreased strength, Decreased activity tolerance, Pain, Increased muscle spasms, Decreased range of motion, Improper body mechanics, Impaired flexibility, Decreased coordination  Visit Diagnosis: Muscle weakness (generalized)  Other muscle spasm  Abnormal posture  Acute bilateral low back pain with bilateral sciatica     Problem List Patient Active Problem List   Diagnosis Date Noted  . Urge incontinence of urine 05/22/2017  . Hyperglycemia 03/17/2017  . Fibromyalgia 10/25/2012  . LLQ abdominal pain 09/08/2012  . Pelvic pain in female 10/02/2011  . Oligomenorrhea 05/20/2011  . VITAMIN D DEFICIENCY 02/15/2009  . OBESITY 06/07/2008  . ANEMIA 04/19/2008  . 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 FATIGUE 04/18/2008  . SKIN RASH 09/21/2007  . BREAST PAIN, BILATERAL 08/22/2007  . CALCANEAL SPUR, RIGHT 08/22/2007  . HEADACHE 08/22/2007  . CALLUSES, FEET, BILATERAL 08/08/2007  . FOOT PAIN, BILATERAL 08/08/2007  . CANDIDIASIS 07/18/2007  . LUMBAGO 07/18/2007  . HELICOBACTER PYLORI GASTRITIS, HX OF 05/30/2007  . POSITIVE PPD 05/18/2007    Zannie Cove, PT 08/10/2017, 5:11 PM  [facility] [address], [place], [place], [address] Phone: [phone]   Fax:  [phone]  Name: Kristin Joseph MRN: [id number] Date of Birth: 05-20-1970

## 2017-08-11 NOTE — Addendum Note (Signed)
Addended by: Hulan Fray on: 08/11/2017 04:45 PM   Modules accepted: Orders

## 2017-08-16 ENCOUNTER — Ambulatory Visit: Payer: Medicaid Other | Admitting: Physical Therapy

## 2017-08-16 ENCOUNTER — Encounter: Payer: Self-pay | Admitting: Physical Therapy

## 2017-08-16 DIAGNOSIS — R293 Abnormal posture: Secondary | ICD-10-CM

## 2017-08-16 DIAGNOSIS — M6281 Muscle weakness (generalized): Secondary | ICD-10-CM

## 2017-08-16 DIAGNOSIS — M62838 Other muscle spasm: Secondary | ICD-10-CM

## 2017-08-16 NOTE — Therapy (Addendum)
[facility] [address] [place], [place], [address] Phone: [phone]   Fax:  [phone]  Physical Therapy Treatment  Patient Details  Name: Kristin Joseph MRN: [id number] Date of Birth: 01/14/1971 Referring Provider: Katherine Roan   Encounter Date: 08/16/2017  PT End of Session - 08/16/17 1705    Visit Number  5    Date for PT Re-Evaluation  09/01/17    Authorization Type  Medicaid 12 visits until 09/26/17    Authorization - Visit Number  3    Authorization - Number of Visits  12    PT Start Time  1630 late    PT Stop Time  1703    PT Time Calculation (min)  33 min    Activity Tolerance  Patient limited by pain    Behavior During Therapy  [facility] for tasks assessed/performed       Past Medical History:  Diagnosis Date  . Back pain   . Depression   . Fibromyalgia 10/25/2012  . Gastritis 06/28/2012   Mild  . Obesity   . Pelvic pain   . Stress headaches   . Vitamin D deficiency     Past Surgical History:  Procedure Laterality Date  . APPENDECTOMY    . FOOT SURGERY Bilateral    Plantar fasciitis  . THYROIDECTOMY, PARTIAL     pt denies    There were no vitals filed for this visit.  Subjective Assessment - 08/16/17 1715    Subjective  Pt arrived late.  Gait was improved since last visit and she did not report pain.    Currently in Pain?  -- did not report pain today                       [facility] Adult PT Treatment/Exercise - 08/16/17 0001      Neuro Re-ed    Neuro Re-ed Details   diaphragmatic breathing 5 min; transverse abdominis      Manual Therapy   Manual Therapy  Myofascial release    Myofascial Release  abdominal fascial release and muscle facilitation                  PT Long Term Goals - 06/28/17 1720      PT LONG TERM GOAL #1   Title  pt will report 50% less urine leakage during typical day    Baseline  urine leaks at least 8x/day    Status  On-going       PT LONG TERM GOAL #2   Title  pt will have nocturia 2x/night at most    Baseline  4x    Status  On-going      PT LONG TERM GOAL #3   Title  pt will have improved muscle coordination in order to have bowel movement without straining    Baseline  unable    Status  On-going      PT LONG TERM GOAL #4   Title  Pt will report being able to empty bladder in one sitting without having to leave and come right back due to improved ability to relax pelvic floor correctly.    Baseline  gets up and down 4x or more    Status  On-going      PT LONG TERM GOAL #5   Title  pt will be ind with HEP for maintenance of strength    Baseline  does not know    Status  On-going            Plan - 08/16/17 1706    Clinical Impression Statement  Patient had less pain today during treatment and gait looked much better when ambulating back to treatment room.  Pt was able to activate transverse abdominus muscle in supine, sitting, and standing.  Patient continues to arrive late and was educated on importance of getting all of her treatment time.  Pt will benefit from skilled PT to work on improved core strength and muscle coordination so she can improve in manageing functional tasks.    PT Treatment/Interventions  Patient/family education;Passive range of motion;Manual techniques;Therapeutic exercise;Therapeutic activities;Electrical Stimulation;Moist Heat;Dry needling;ADLs/Self Care Home Management;Biofeedback;Neuromuscular re-education;Taping    PT Next Visit Plan  biofeedback if patient arrives on time, lumbar ROM and core strength    PT Home Exercise Plan  Access Code: [email or web address]    Consulted and Agree with Plan of Care  Patient       Patient will benefit from skilled therapeutic intervention in order to improve the following deficits and impairments:  Postural dysfunction, Decreased strength, Decreased activity tolerance, Pain, Increased muscle spasms, Decreased range of motion, Improper body mechanics,  Impaired flexibility, Decreased coordination  Visit Diagnosis: Muscle weakness (generalized)  Other muscle spasm  Abnormal posture     Problem List Patient Active Problem List   Diagnosis Date Noted  . Urge incontinence of urine 05/22/2017  . Hyperglycemia 03/17/2017  . Fibromyalgia 10/25/2012  . LLQ abdominal pain 09/08/2012  . Pelvic pain in female 10/02/2011  . Oligomenorrhea 05/20/2011  . VITAMIN D DEFICIENCY 02/15/2009  . OBESITY 06/07/2008  . ANEMIA 04/19/2008  . 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 FATIGUE 04/18/2008  . SKIN RASH 09/21/2007  . BREAST PAIN, BILATERAL 08/22/2007  . CALCANEAL SPUR, RIGHT 08/22/2007  . HEADACHE 08/22/2007  . CALLUSES, FEET, BILATERAL 08/08/2007  . FOOT PAIN, BILATERAL 08/08/2007  . CANDIDIASIS 07/18/2007  . LUMBAGO 07/18/2007  . HELICOBACTER PYLORI GASTRITIS, HX OF 05/30/2007  . POSITIVE PPD 05/18/2007    Zannie Cove, PT 08/16/2017, 5:31 PM  [facility] [address] [place], [place], [address] Phone: [phone]   Fax:  [phone]  Name: Kristin Joseph MRN: [id number] Date of Birth: 10/17/1970  PHYSICAL THERAPY DISCHARGE SUMMARY  Visits from Start of Care: 5  Current functional level related to goals / functional outcomes: See above   Remaining deficits: See above   Education / Equipment: HEP  Plan: Patient agrees to discharge.  Patient goals were not met. Patient is being discharged due to not returning since the last visit.  ?????    [employer], PT 09/02/17 2:30 PM

## 2017-08-20 ENCOUNTER — Encounter: Payer: Medicaid Other | Admitting: Internal Medicine

## 2017-08-23 ENCOUNTER — Encounter: Payer: Medicaid Other | Admitting: Physical Therapy

## 2017-08-27 ENCOUNTER — Encounter: Payer: Medicaid Other | Admitting: Physical Therapy

## 2017-08-30 ENCOUNTER — Encounter: Payer: Medicaid Other | Admitting: Physical Therapy

## 2017-09-02 ENCOUNTER — Ambulatory Visit: Payer: Medicaid Other | Attending: Internal Medicine | Admitting: Physical Therapy

## 2017-09-07 ENCOUNTER — Encounter: Payer: Medicaid Other | Admitting: Physical Therapy

## 2017-09-10 ENCOUNTER — Encounter: Payer: Self-pay | Admitting: Internal Medicine

## 2017-09-10 ENCOUNTER — Encounter: Payer: Medicaid Other | Admitting: Internal Medicine

## 2017-09-10 NOTE — Progress Notes (Deleted)
   CC: ***  HPI:  [name] is a 47 y.o. female with fibromyalgia, H. pylori gastritis who presents for, vitamin D deficiency follow-up regarding her fibromyalgia. Please see problem based charting for evaluation, assessment, and plan.  Past Medical History:  Diagnosis Date  . Back pain   . Depression   . Fibromyalgia 10/25/2012  . Gastritis 06/28/2012   Mild  . Obesity   . Pelvic pain   . Stress headaches   . Vitamin D deficiency    Review of Systems:  ***  Physical Exam:  There were no vitals filed for this visit. ***  Assessment & Plan:   See Encounters Tab for problem based charting.  Fibromyalgia The patient continues to have pain The patient is currently on duloxetine 60 mg daily and gabapentin 300 mg 3 times daily.  Behavioral health referral for CBT***.  Health maintenance Pap Smear Tdap Flu shot  Patient {GC/GE:3044014::"discussed with","seen with"} Dr. {NAMES:3044014::"Butcher","Granfortuna","E. Hoffman","Klima","Mullen","Narendra","Raines","Vincent"}

## 2017-09-14 ENCOUNTER — Encounter: Payer: Medicaid Other | Admitting: Physical Therapy

## 2017-09-21 ENCOUNTER — Ambulatory Visit: Payer: Medicaid Other | Admitting: Physical Therapy

## 2017-10-08 ENCOUNTER — Ambulatory Visit: Payer: Medicaid Other | Admitting: Internal Medicine

## 2017-10-08 ENCOUNTER — Encounter: Payer: Self-pay | Admitting: Internal Medicine

## 2017-10-08 VITALS — BP 107/61 | HR 82 | Temp 98.3°F | Wt 190.3 lb

## 2017-10-08 DIAGNOSIS — G8929 Other chronic pain: Secondary | ICD-10-CM | POA: Diagnosis not present

## 2017-10-08 DIAGNOSIS — N915 Oligomenorrhea, unspecified: Secondary | ICD-10-CM

## 2017-10-08 DIAGNOSIS — Z0000 Encounter for general adult medical examination without abnormal findings: Secondary | ICD-10-CM | POA: Insufficient documentation

## 2017-10-08 DIAGNOSIS — N3941 Urge incontinence: Secondary | ICD-10-CM | POA: Diagnosis not present

## 2017-10-08 DIAGNOSIS — R109 Unspecified abdominal pain: Secondary | ICD-10-CM

## 2017-10-08 DIAGNOSIS — F419 Anxiety disorder, unspecified: Secondary | ICD-10-CM | POA: Diagnosis not present

## 2017-10-08 DIAGNOSIS — M5441 Lumbago with sciatica, right side: Secondary | ICD-10-CM

## 2017-10-08 DIAGNOSIS — M5442 Lumbago with sciatica, left side: Secondary | ICD-10-CM

## 2017-10-08 DIAGNOSIS — M545 Low back pain: Secondary | ICD-10-CM

## 2017-10-08 DIAGNOSIS — R42 Dizziness and giddiness: Secondary | ICD-10-CM

## 2017-10-08 DIAGNOSIS — M797 Fibromyalgia: Secondary | ICD-10-CM

## 2017-10-08 DIAGNOSIS — Z79899 Other long term (current) drug therapy: Secondary | ICD-10-CM

## 2017-10-08 NOTE — Assessment & Plan Note (Addendum)
Influenza vaccine given Microalbumin/creatinine ratio ordered

## 2017-10-08 NOTE — Assessment & Plan Note (Signed)
The patient continues to have lumbar back pain that radiates down bilateral lower legs. Bilateral lower extremity strength is good. At previous visit on 05/21/17 the patient was ordered an MRI to rule out for structural problems. The MRI does not seem to have been completed yet. Will check with clinic staff.

## 2017-10-08 NOTE — Progress Notes (Signed)
   CC: Fibromyalgia follow up  HPI:  [name] is a 47 y.o. with fibromyalgia, urge incontinence who presents for follow up regarding fibromyalgia. Please see problem based charting for evaluation, assessment, and plan.  Past Medical History:  Diagnosis Date  . 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 Back pain   . CALLUSES, FEET, BILATERAL 08/08/2007   Qualifier: Diagnosis of  By: Hassell Done FNP, Tori Milks    . Depression   . FATIGUE 04/18/2008   Qualifier: Diagnosis of  By: Hassell Done FNP, Tori Milks    . Fibromyalgia 10/25/2012  . Gastritis 06/28/2012   Mild  . HEADACHE 08/22/2007   Qualifier: Diagnosis of  By: Hassell Done FNP, Tori Milks    . Hyperglycemia 03/17/2017  . Lumbago 07/18/2007   Qualifier: Diagnosis of  By: Hassell Done FNP, Tori Milks    . Obesity   . Pelvic pain   . SKIN RASH 09/21/2007   Qualifier: Diagnosis of  By: Hassell Done FNP, Tori Milks    . Stress headaches   . Vitamin D deficiency    Review of Systems:    Review of Systems  Constitutional: Positive for malaise/fatigue. Negative for chills and fever.  Gastrointestinal: Positive for abdominal pain. Negative for nausea and vomiting.  Genitourinary: Positive for urgency.  Musculoskeletal: Positive for back pain, joint pain, myalgias and neck pain.  Neurological: Positive for dizziness.  Psychiatric/Behavioral: The patient is nervous/anxious.    Physical Exam:  Vitals:   10/08/17 1600  BP: 107/61  Pulse: 82  Temp: 98.3 F (36.8 C)  TempSrc: Oral  SpO2: 100%  Weight: 190 lb 4.8 oz (86.3 kg)   Physical Exam  Constitutional: She is oriented to person, place, and time. She appears well-developed and well-nourished. No distress.  Eyes: Conjunctivae are normal.  Cardiovascular: Normal rate, regular rhythm and normal heart sounds.  Respiratory: Breath sounds normal. No respiratory distress. She has no wheezes.  Shallow breaths  GI: Soft. Bowel sounds are normal. She exhibits no  distension. There is tenderness (generalized to soft palpation).  Musculoskeletal:  Allodynia to touch on bilateral shoulders, ankles, knees also at cervical neck  Neurological: She is alert and oriented to person, place, and time.  Skin: She is not diaphoretic. No erythema.  Psychiatric: Her speech is normal and behavior is normal. Thought content normal. Her mood appears anxious.   Assessment & Plan:   See Encounters Tab for problem based charting.  Patient discussed with Dr. Dareen Piano

## 2017-10-08 NOTE — Assessment & Plan Note (Addendum)
The patient states that she has had pain relief with cymbalta 32m qd and gabapentin 30107mtid, but she has been very drowsy. She continues to have pain in neck, bilateral shoulders, bilateral ankles, and abdomen. Patient has had TSH, SSA SSB, vit d, ck, ana, rheumatoid factor, ccp were all negative. CRP and ESR was mildly elevated at 9.7 and 37 respectively.  Assessment and plan  She was given a FIQ questionnaire that is documented and placed under media tab. She rates herself as in severe pain, tired, stiff, anxious, and depressed. The patient states that she has found relief from cymbalta and gabapentin.   -Due to the patient's drowsiness, recommended the patient decrease gabapentin from 300tid to 300 in the am and 300 prior to sleep.  -Recommended she continue cymbalta 6034md -Recommended non-pharmacological treatment including: yoga, accupuncture, tai chi, and massage therapy -Referred to psychiatry for CBT therapy

## 2017-10-08 NOTE — Patient Instructions (Signed)
It was a pleasure to see you today Kristin Joseph.  Please continue cymbalta and gabapentin. Please also continue yoga and get massage. I have referred you to psychiatry for cognitive behavioral therapy to help with your fibromyalgia.   If you have any questions or concerns, please call our clinic at [phone] between 9am-5pm and after hours call [phone] and ask for the internal medicine resident on call. If you feel you are having a medical emergency please call 911.   Thank you, we look forward to help you remain healthy!  Lars Mage, MD Internal Medicine PGY2

## 2017-10-08 NOTE — Assessment & Plan Note (Signed)
The patient states that she continues to urinate frequently and has to wear diaper pads due to the incontinence. She is currently taking oxybutynin 5mg  tid. She has gone to physical therapy for pelvic floor exercises in the past which she has found beneficial, but she needs to be re-referred.   -Referral for physical therapy on [address] sent

## 2017-10-08 NOTE — Assessment & Plan Note (Signed)
Patient states that for the past year she has been having decreased amount of periods occurring approximately every 3-4 months. The periods last 3-5 days in duration.   Assessment and plan  The patient is perimenopausal. She states that she has been feeling fatigued recently. She has prior history of anemia that is documented in the chart. Will get CBC during this encounter to evaluate.

## 2017-10-09 LAB — CBC
Hematocrit: 34.3 % (ref 34.0–46.6)
Hemoglobin: 11.2 g/dL (ref 11.1–15.9)
MCH: 29.1 pg (ref 26.6–33.0)
MCHC: 32.7 g/dL (ref 31.5–35.7)
MCV: 89 fL (ref 79–97)
PLATELETS: 260 10*3/uL (ref 150–450)
RBC: 3.85 x10E6/uL (ref 3.77–5.28)
RDW: 12.9 % (ref 12.3–15.4)
WBC: 4.5 10*3/uL (ref 3.4–10.8)

## 2017-10-09 LAB — MICROALBUMIN / CREATININE URINE RATIO
CREATININE, UR: 77.3 mg/dL
MICROALBUM., U, RANDOM: 4.5 ug/mL
Microalb/Creat Ratio: 5.8 mg/g creat (ref 0.0–30.0)

## 2017-10-11 NOTE — Progress Notes (Signed)
Internal Medicine Clinic Attending  Case discussed with Dr. Chundi at the time of the visit.  We reviewed the resident's history and exam and pertinent patient test results.  I agree with the assessment, diagnosis, and plan of care documented in the resident's note. 

## 2017-10-21 ENCOUNTER — Encounter: Payer: Self-pay | Admitting: Physical Therapy

## 2017-10-21 ENCOUNTER — Ambulatory Visit: Payer: Medicaid Other | Attending: Internal Medicine | Admitting: Physical Therapy

## 2017-10-21 DIAGNOSIS — M5442 Lumbago with sciatica, left side: Secondary | ICD-10-CM | POA: Insufficient documentation

## 2017-10-21 DIAGNOSIS — M6281 Muscle weakness (generalized): Secondary | ICD-10-CM | POA: Insufficient documentation

## 2017-10-21 DIAGNOSIS — M5441 Lumbago with sciatica, right side: Secondary | ICD-10-CM | POA: Insufficient documentation

## 2017-10-21 DIAGNOSIS — R293 Abnormal posture: Secondary | ICD-10-CM | POA: Diagnosis present

## 2017-10-21 DIAGNOSIS — M62838 Other muscle spasm: Secondary | ICD-10-CM | POA: Insufficient documentation
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
## 2017-10-26 ENCOUNTER — Encounter: Payer: Self-pay | Admitting: Internal Medicine

## 2017-11-05 ENCOUNTER — Encounter

## 2017-11-10 ENCOUNTER — Ambulatory Visit: Payer: Medicaid Other | Admitting: Physical Therapy

## 2017-11-10 ENCOUNTER — Encounter: Payer: Self-pay | Admitting: Physical Therapy

## 2017-11-10 DIAGNOSIS — M5441 Lumbago with sciatica, right side: Secondary | ICD-10-CM

## 2017-11-10 DIAGNOSIS — M6281 Muscle weakness (generalized): Secondary | ICD-10-CM | POA: Diagnosis not present

## 2017-11-10 DIAGNOSIS — M62838 Other muscle spasm: Secondary | ICD-10-CM

## 2017-11-10 DIAGNOSIS — R293 Abnormal posture: Secondary | ICD-10-CM

## 2017-11-10 DIAGNOSIS — M5442 Lumbago with sciatica, left side: Secondary | ICD-10-CM

## 2017-11-10 NOTE — Therapy (Signed)
[facility] [address], [place], [place], [address] Phone: [phone]   Fax:  [phone]  Physical Therapy Treatment  Patient Details  Name: Kristin Joseph MRN: [id number] Date of Birth: Jul 22, 1970 Referring Provider (PT): Lars Mage, MD; Aldine Contes    Encounter Date: 11/10/2017  PT End of Session - 11/10/17 1409    Visit Number  2    Number of Visits  13    Date for PT Re-Evaluation  12/16/17    Authorization - Visit Number  1    Authorization - Number of Visits  12    PT Start Time  8811    PT Stop Time  1444    PT Time Calculation (min)  41 min    Activity Tolerance  Patient tolerated treatment well    Behavior During Therapy  [facility] for tasks assessed/performed       Past Medical History:  Diagnosis Date  . 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 Back pain   . CALLUSES, FEET, BILATERAL 08/08/2007   Qualifier: Diagnosis of  By: Hassell Done FNP, Tori Milks    . Depression   . FATIGUE 04/18/2008   Qualifier: Diagnosis of  By: Hassell Done FNP, Tori Milks    . Fibromyalgia 10/25/2012  . Gastritis 06/28/2012   Mild  . HEADACHE 08/22/2007   Qualifier: Diagnosis of  By: Hassell Done FNP, Tori Milks    . Hyperglycemia 03/17/2017  . Lumbago 07/18/2007   Qualifier: Diagnosis of  By: Hassell Done FNP, Tori Milks    . Obesity   . Pelvic pain   . SKIN RASH 09/21/2007   Qualifier: Diagnosis of  By: Hassell Done FNP, Tori Milks    . Stress headaches   . Vitamin D deficiency     Past Surgical History:  Procedure Laterality Date  . APPENDECTOMY    . FOOT SURGERY Bilateral    Plantar fasciitis  . THYROIDECTOMY, PARTIAL     pt denies    There were no vitals filed for this visit.  Subjective Assessment - 11/10/17 1408    Subjective  Pt states pain from back down both legs    Currently in Pain?  Yes    Pain Score  9     Pain Location  Back    Pain Orientation  Lower;Right;Left    Pain  Descriptors / Indicators  Constant    Pain Type  Chronic pain    Pain Onset  More than a month ago                       [facility] Adult PT Treatment/Exercise - 11/10/17 0001      Manual Therapy   Manual Therapy  Myofascial release    Myofascial Release  one hand anterior/one hand posterior for diaphragm releases; sacral and occipital tractions, frontal and parietal releases               PT Short Term Goals - 10/21/17 1556      PT SHORT TERM GOAL #1   Title  She will be independent with intial HEP    Status  New    Target Date  11/18/17      PT SHORT TERM GOAL #2   Title  pt will be report able to be finished going to the bathroom after only 2 attempts    Baseline  3 attempts    Time  8    Period  Weeks  Status  New    Target Date  11/18/17      PT SHORT TERM GOAL #3   Title  ...    Baseline  ...        PT Long Term Goals - 10/21/17 1602      PT LONG TERM GOAL #1   Title  pt will be able to report leakage 1x/day at the most    Baseline  2-3x/day    Time  8    Period  Weeks    Status  New    Target Date  12/16/17      PT LONG TERM GOAL #2   Title  pt will have 50% less volume of leakage due to improved strength    Baseline  full contents of bladder empties    Time  8    Period  Weeks    Status  New    Target Date  12/16/17      PT LONG TERM GOAL #3   Title  pt will demonstrate 5 x sit to stand in < or = to 30 sec demonstrating improved strength and stability for functional transfers    Baseline  39 sec    Time  8    Period  Weeks    Status  New    Target Date  12/16/17      PT LONG TERM GOAL #4   Title  Pt will report being able to empty bladder in one sitting without having to leave and come right back due to improved ability to relax pelvic floor correctly.    Baseline  gets up and down 3x or more    Time  8    Period  Weeks    Status  New    Target Date  12/16/17      PT LONG TERM GOAL #5   Title  pt will be ind with HEP  for maintenance of strength    Baseline  does not know    Time  8    Period  Weeks    Status  New    Target Date  12/16/17            Plan - 11/10/17 1619    Clinical Impression Statement  Pt has not met any goals yet due to today being first treatment.  Pt demonstrates ability to relax during fascial release today.  Pt was able to make it through entire treatment without using the bathroom but then had to go immediately following treatment.  Pt will benefit from skilled PT to continue according to POC.    PT Treatment/Interventions  Patient/family education;Passive range of motion;Manual techniques;Therapeutic exercise;Therapeutic activities;Electrical Stimulation;Moist Heat;Dry needling;ADLs/Self Care Home Management;Biofeedback;Neuromuscular re-education;Taping;Cryotherapy    PT Next Visit Plan  internal STM for reduced spasms and increased circular contraction, abdominal fascial release, TrA activation, core and hip strength    PT Home Exercise Plan  Access Code: [email or web address]    Consulted and Agree with Plan of Care  Patient       Patient will benefit from skilled therapeutic intervention in order to improve the following deficits and impairments:  Postural dysfunction, Decreased strength, Decreased activity tolerance, Pain, Increased muscle spasms, Decreased range of motion, Improper body mechanics, Impaired flexibility, Decreased coordination  Visit Diagnosis: Muscle weakness (generalized)  Other muscle spasm  Abnormal posture  Acute bilateral low back pain with bilateral sciatica     Problem List Patient Active Problem List   Diagnosis Date Noted  .  Healthcare maintenance 10/08/2017  . Urge incontinence of urine 05/22/2017  . Fibromyalgia 10/25/2012  . LLQ abdominal pain 09/08/2012  . Oligomenorrhea 05/20/2011  . Vitamin D deficiency 02/15/2009  . Calcaneal spur of foot, right 08/22/2007  . Lower back pain 07/18/2007    Zannie Cove, PT 11/10/2017, 4:23  PM  [facility] [address] [place], [place], [address] Phone: [phone]   Fax:  [phone]  Name: Kristin [place] MRN: [id number] Date of Birth: 09-07-1970

## 2017-11-15 ENCOUNTER — Ambulatory Visit: Payer: Medicaid Other | Admitting: Physical Therapy

## 2017-11-15 DIAGNOSIS — M6281 Muscle weakness (generalized): Secondary | ICD-10-CM | POA: Diagnosis not present

## 2017-11-15 DIAGNOSIS — M62838 Other muscle spasm: Secondary | ICD-10-CM

## 2017-11-15 DIAGNOSIS — R293 Abnormal posture: Secondary | ICD-10-CM

## 2017-11-15 NOTE — Therapy (Signed)
[facility] [address], [place], [place], [address] Phone: [phone]   Fax:  [phone]  Physical Therapy Treatment  Patient Details  Name: Kristin [place] MRN: [id number] Date of Birth: 03/14/1970 Referring Provider (PT): Lars Mage, MD; Aldine Contes    Encounter Date: 11/15/2017  PT End of Session - 11/15/17 1405    Visit Number  3    Number of Visits  13    Date for PT Re-Evaluation  12/16/17    Authorization Type  Medicaid 12 visits until 12/26/17    Authorization - Visit Number  2    Authorization - Number of Visits  12    PT Start Time  3810    PT Stop Time  1443    PT Time Calculation (min)  38 min    Activity Tolerance  Patient tolerated treatment well    Behavior During Therapy  [facility] for tasks assessed/performed       Past Medical History:  Diagnosis Date  . 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 Back pain   . CALLUSES, FEET, BILATERAL 08/08/2007   Qualifier: Diagnosis of  By: Hassell Done FNP, Tori Milks    . Depression   . FATIGUE 04/18/2008   Qualifier: Diagnosis of  By: Hassell Done FNP, Tori Milks    . Fibromyalgia 10/25/2012  . Gastritis 06/28/2012   Mild  . HEADACHE 08/22/2007   Qualifier: Diagnosis of  By: Hassell Done FNP, Tori Milks    . Hyperglycemia 03/17/2017  . Lumbago 07/18/2007   Qualifier: Diagnosis of  By: Hassell Done FNP, Tori Milks    . Obesity   . Pelvic pain   . SKIN RASH 09/21/2007   Qualifier: Diagnosis of  By: Hassell Done FNP, Tori Milks    . Stress headaches   . Vitamin D deficiency     Past Surgical History:  Procedure Laterality Date  . APPENDECTOMY    . FOOT SURGERY Bilateral    Plantar fasciitis  . THYROIDECTOMY, PARTIAL     pt denies    There were no vitals filed for this visit.  Subjective Assessment - 11/15/17 1416    Subjective  Pt states pain when going to urine.  She reports she feels bloated when drinking water and that she hasn't been  drinking much.  Pt defers internal or biofeedback today.    Limitations  House hold activities    Patient Stated Goals  be able to go to the bathroom 1x; no leakage    Currently in Pain?  Yes    Pain Score  7     Pain Location  Pelvis    Pain Descriptors / Indicators  Aching    Pain Type  Chronic pain    Pain Onset  More than a month ago    Pain Frequency  Intermittent    Aggravating Factors   all of the time    Multiple Pain Sites  No                       [facility] Adult PT Treatment/Exercise - 11/15/17 0001      Self-Care   Other Self-Care Comments   educated in importance of drinking water and urge to void      Neuro Re-ed    Neuro Re-ed Details   sitting on ball, circles both ways, pelvic tilt and circles in supine      Modalities   Modalities  Moist Heat  Moist Heat Therapy   Number Minutes Moist Heat  10 Minutes   in supine while doing pelvic tilts   Moist Heat Location  Lumbar Spine             PT Education - 11/15/17 1442    Education Details   Access Code: [email or web address]     Person(s) Educated  Patient    Methods  Explanation;Demonstration;Handout    Comprehension  Verbalized understanding;Returned demonstration       PT Short Term Goals - 11/15/17 1449      PT SHORT TERM GOAL #1   Title  She will be independent with intial HEP    Status  On-going      PT SHORT TERM GOAL #2   Title  pt will be report able to be finished going to the bathroom after only 2 attempts    Baseline  3 attempts    Status  On-going        PT Long Term Goals - 10/21/17 1602      PT LONG TERM GOAL #1   Title  pt will be able to report leakage 1x/day at the most    Baseline  2-3x/day    Time  8    Period  Weeks    Status  New    Target Date  12/16/17      PT LONG TERM GOAL #2   Title  pt will have 50% less volume of leakage due to improved strength    Baseline  full contents of bladder empties    Time  8    Period  Weeks    Status  New    Target Date   12/16/17      PT LONG TERM GOAL #3   Title  pt will demonstrate 5 x sit to stand in < or = to 30 sec demonstrating improved strength and stability for functional transfers    Baseline  39 sec    Time  8    Period  Weeks    Status  New    Target Date  12/16/17      PT LONG TERM GOAL #4   Title  Pt will report being able to empty bladder in one sitting without having to leave and come right back due to improved ability to relax pelvic floor correctly.    Baseline  gets up and down 3x or more    Time  8    Period  Weeks    Status  New    Target Date  12/16/17      PT LONG TERM GOAL #5   Title  pt will be ind with HEP for maintenance of strength    Baseline  does not know    Time  8    Period  Weeks    Status  New    Target Date  12/16/17            Plan - 11/15/17 1442    Clinical Impression Statement  Pt tolerated exercises well today with no visual signs of increased pain.  Pt catastophizes during session and needs some redirecting.  Pt was educated in importance of drinking more water due to her reporting she doesn't drink much because of her issue.  Pt did well with circles after several attempts and tactile cues.  Pt will benefit from skilled PT to work on improved awareness and funcitonal pelvic floor muscles.    PT Treatment/Interventions  Patient/family education;Passive range  of motion;Manual techniques;Therapeutic exercise;Therapeutic activities;Electrical Stimulation;Moist Heat;Dry needling;ADLs/Self Care Home Management;Biofeedback;Neuromuscular re-education;Taping;Cryotherapy    PT Next Visit Plan  internal STM for reduced spasms and increased circular contraction, review pelvic clocks, abdominal fascial release, TrA activation, core and hip strength    PT Home Exercise Plan  Access Code: [email or web address]    Recommended Other Services  cert signed    Consulted and Agree with Plan of Care  Patient       Patient will benefit from skilled therapeutic intervention in order  to improve the following deficits and impairments:  Postural dysfunction, Decreased strength, Decreased activity tolerance, Pain, Increased muscle spasms, Decreased range of motion, Improper body mechanics, Impaired flexibility, Decreased coordination  Visit Diagnosis: Muscle weakness (generalized)  Other muscle spasm  Abnormal posture     Problem List Patient Active Problem List   Diagnosis Date Noted  . Healthcare maintenance 10/08/2017  . Urge incontinence of urine 05/22/2017  . Fibromyalgia 10/25/2012  . LLQ abdominal pain 09/08/2012  . Oligomenorrhea 05/20/2011  . Vitamin D deficiency 02/15/2009  . Calcaneal spur of foot, right 08/22/2007  . Lower back pain 07/18/2007    Zannie Cove , PT 11/15/2017, 5:48 PM  [facility] [address], [place], [place], [address] Phone: [phone]   Fax:  [phone]  Name: Kristin [place] MRN: [id number] Date of Birth: [birth year]

## 2017-11-15 NOTE — Patient Instructions (Signed)
Access Code: [email or web address]  URL: [email or web address]  Date: 11/15/2017  Prepared by: Lovett Calender   Exercises  Hooklying Small March - 10 reps - 1 sets - 3 sec hold - 2x daily - 7x weekly  Supine Hip Adduction Isometric with Ball - 10 reps - 1 sets - 3 sec hold - 2x daily - 7x weekly  Standing Hip Flexor Stretch - 2 reps - 1 sets - 30 sec hold - 3x daily - 7x weekly  Supine Pelvic Tilt - 10 reps - 3 sets - 1x daily - 7x weekly  Supine Figure 4 Piriformis Stretch - 3 reps - 1 sets - 30 sec hold - 1x daily - 7x weekly  Supine Hamstring Stretch - 3 reps - 1 sets - 30 sec hold - 1x daily - 7x weekly

## 2017-11-22 ENCOUNTER — Ambulatory Visit: Payer: Medicaid Other | Attending: Internal Medicine | Admitting: Physical Therapy

## 2017-11-22 DIAGNOSIS — M6281 Muscle weakness (generalized): Secondary | ICD-10-CM | POA: Diagnosis present

## 2017-11-22 DIAGNOSIS — R293 Abnormal posture: Secondary | ICD-10-CM | POA: Diagnosis present

## 2017-11-22 DIAGNOSIS — M62838 Other muscle spasm: Secondary | ICD-10-CM | POA: Insufficient documentation

## 2017-11-22 NOTE — Therapy (Signed)
[facility] [address], [place], [place], [address] Phone: [phone]   Fax:  [phone]  Physical Therapy Treatment  Patient Details  Name: Kristin Joseph MRN: [id number] Date of Birth: [birth year] Referring Provider (PT): Lars Mage, MD; Aldine Contes    Encounter Date: 11/22/2017  PT End of Session - 11/22/17 1502    Visit Number  4    Number of Visits  13    Date for PT Re-Evaluation  12/16/17    Authorization Type  Medicaid 12 visits until 12/26/17    Authorization - Visit Number  3    Authorization - Number of Visits  12    PT Start Time  8841    PT Stop Time  1543    PT Time Calculation (min)  48 min    Activity Tolerance  Patient tolerated treatment well    Behavior During Therapy  [facility] for tasks assessed/performed       Past Medical History:  Diagnosis Date  . 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 Back pain   . CALLUSES, FEET, BILATERAL 08/08/2007   Qualifier: Diagnosis of  By: Hassell Done FNP, Tori Milks    . Depression   . FATIGUE 04/18/2008   Qualifier: Diagnosis of  By: Hassell Done FNP, Tori Milks    . Fibromyalgia 10/25/2012  . Gastritis 06/28/2012   Mild  . HEADACHE 08/22/2007   Qualifier: Diagnosis of  By: Hassell Done FNP, Tori Milks    . Hyperglycemia 03/17/2017  . Lumbago 07/18/2007   Qualifier: Diagnosis of  By: Hassell Done FNP, Tori Milks    . Obesity   . Pelvic pain   . SKIN RASH 09/21/2007   Qualifier: Diagnosis of  By: Hassell Done FNP, Tori Milks    . Stress headaches   . Vitamin D deficiency     Past Surgical History:  Procedure Laterality Date  . APPENDECTOMY    . FOOT SURGERY Bilateral    Plantar fasciitis  . THYROIDECTOMY, PARTIAL     pt denies    There were no vitals filed for this visit.  Subjective Assessment - 11/22/17 1504    Subjective  The urine is coming 5 x when I am going to the toilet.  I am drinking 4-5 glasses of water.     Limitations   House hold activities    Patient Stated Goals  be able to go to the bathroom 1x; no leakage    Currently in Pain?  Yes    Pain Location  Pelvis    Pain Orientation  Lower    Pain Descriptors / Indicators  Aching    Pain Type  Chronic pain    Pain Onset  More than a month ago    Pain Frequency  Intermittent    Multiple Pain Sites  No                       [facility] Adult PT Treatment/Exercise - 11/22/17 0001      Neuro Re-ed    Neuro Re-ed Details   contract and relax - able to get about 29mV contraction for 1 sec holds, at least 5 sec rest breaks, squeeze with ball and hold 3 sec with 10 sec rests      Exercises   Exercises  Lumbar      Lumbar Exercises: Stretches   Hip Flexor Stretch  Right;Left;3 reps;20 seconds  PT Education - 11/22/17 1555    Education Details   Access Code: [email or web address]     Person(s) Educated  Patient    Methods  Explanation;Demonstration;Handout;Verbal cues    Comprehension  Verbalized understanding;Returned demonstration       PT Short Term Goals - 11/15/17 1449      PT SHORT TERM GOAL #1   Title  She will be independent with intial HEP    Status  On-going      PT SHORT TERM GOAL #2   Title  pt will be report able to be finished going to the bathroom after only 2 attempts    Baseline  3 attempts    Status  On-going        PT Long Term Goals - 10/21/17 1602      PT LONG TERM GOAL #1   Title  pt will be able to report leakage 1x/day at the most    Baseline  2-3x/day    Time  8    Period  Weeks    Status  New    Target Date  12/16/17      PT LONG TERM GOAL #2   Title  pt will have 50% less volume of leakage due to improved strength    Baseline  full contents of bladder empties    Time  8    Period  Weeks    Status  New    Target Date  12/16/17      PT LONG TERM GOAL #3   Title  pt will demonstrate 5 x sit to stand in < or = to 30 sec demonstrating improved strength and stability for functional transfers     Baseline  39 sec    Time  8    Period  Weeks    Status  New    Target Date  12/16/17      PT LONG TERM GOAL #4   Title  Pt will report being able to empty bladder in one sitting without having to leave and come right back due to improved ability to relax pelvic floor correctly.    Baseline  gets up and down 3x or more    Time  8    Period  Weeks    Status  New    Target Date  12/16/17      PT LONG TERM GOAL #5   Title  pt will be ind with HEP for maintenance of strength    Baseline  does not know    Time  8    Period  Weeks    Status  New    Target Date  12/16/17            Plan - 11/22/17 1544    Clinical Impression Statement  Pt had good resting tone 4-75mV with knees bent and 2 with LE resting.  Pt has weak contraction but was able to improve using biofeedback.  Pt needs a lot of verbal explanation of how to add to HEP.  Pt could not remember year of birth when she was asked today and she reports sometimes she cannot think of things like that.  Pt continues to need skilled PT to progress strength and endurance for improved abilty to control urinary leakage.    PT Treatment/Interventions  Patient/family education;Passive range of motion;Manual techniques;Therapeutic exercise;Therapeutic activities;Electrical Stimulation;Moist Heat;Dry needling;ADLs/Self Care Home Management;Biofeedback;Neuromuscular re-education;Taping;Cryotherapy    PT Next Visit Plan  biofeedback, progress core and pelvic floor strength and  endurance    PT Home Exercise Plan  Access Code: [email or web address]    Consulted and Agree with Plan of Care  Patient       Patient will benefit from skilled therapeutic intervention in order to improve the following deficits and impairments:  Postural dysfunction, Decreased strength, Decreased activity tolerance, Pain, Increased muscle spasms, Decreased range of motion, Improper body mechanics, Impaired flexibility, Decreased coordination  Visit Diagnosis: Muscle weakness  (generalized)  Other muscle spasm  Abnormal posture     Problem List Patient Active Problem List   Diagnosis Date Noted  . Healthcare maintenance 10/08/2017  . Urge incontinence of urine 05/22/2017  . Fibromyalgia 10/25/2012  . LLQ abdominal pain 09/08/2012  . Oligomenorrhea 05/20/2011  . Vitamin D deficiency 02/15/2009  . Calcaneal spur of foot, right 08/22/2007  . Lower back pain 07/18/2007    Zannie Cove, PT 11/22/2017, 3:59 PM  [facility] [address], [place], [place], [address] Phone: [phone]   Fax:  [phone]  Name: Ramonica Grigg MRN: [id number] Date of Birth: [birth year]

## 2017-11-22 NOTE — Patient Instructions (Signed)
Access Code: [email or web address]  URL: [email or web address]  Date: 11/22/2017  Prepared by: Lovett Calender   Exercises  Hooklying Small March - 10 reps - 1 sets - 3 sec hold - 2x daily - 7x weekly  Standing Hip Flexor Stretch - 2 reps - 1 sets - 30 sec hold - 3x daily - 7x weekly  Supine Pelvic Tilt - 10 reps - 3 sets - 1x daily - 7x weekly  Supine Figure 4 Piriformis Stretch - 3 reps - 1 sets - 30 sec hold - 1x daily - 7x weekly  Supine Hamstring Stretch - 3 reps - 1 sets - 30 sec hold - 1x daily - 7x weekly  Ball squeeze with Kegel - 10 reps - 1 sets - 3 sec hold - 3x daily - 7x weekly

## 2017-11-25 ENCOUNTER — Ambulatory Visit: Payer: Medicaid Other | Admitting: Physical Therapy

## 2017-12-06 ENCOUNTER — Ambulatory Visit: Payer: Medicaid Other | Admitting: Physical Therapy

## 2017-12-06 ENCOUNTER — Encounter: Payer: Self-pay | Admitting: Physical Therapy

## 2017-12-06 DIAGNOSIS — R293 Abnormal posture: Secondary | ICD-10-CM

## 2017-12-06 DIAGNOSIS — M6281 Muscle weakness (generalized): Secondary | ICD-10-CM

## 2017-12-06 DIAGNOSIS — M62838 Other muscle spasm: Secondary | ICD-10-CM

## 2017-12-06 NOTE — Therapy (Signed)
[facility] [address], [place], [place], [address] Phone: [phone]   Fax:  [phone]  Physical Therapy Treatment  Patient Details  Name: Kristin Joseph MRN: [id number] Date of Birth: Aug 13, 1970 Referring Provider (PT): Lars Mage, MD; Aldine Contes    Encounter Date: 12/06/2017  PT End of Session - 12/06/17 1504    Visit Number  5    Number of Visits  13    Date for PT Re-Evaluation  12/16/17    Authorization Type  Medicaid 12 visits until 12/26/17    Authorization - Visit Number  4    Authorization - Number of Visits  12    PT Start Time  1500   pt arrived late   PT Stop Time  1525    PT Time Calculation (min)  25 min    Activity Tolerance  Patient tolerated treatment well    Behavior During Therapy  [facility] for tasks assessed/performed       Past Medical History:  Diagnosis Date  . 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 Back pain   . CALLUSES, FEET, BILATERAL 08/08/2007   Qualifier: Diagnosis of  By: Hassell Done FNP, Tori Milks    . Depression   . FATIGUE 04/18/2008   Qualifier: Diagnosis of  By: Hassell Done FNP, Tori Milks    . Fibromyalgia 10/25/2012  . Gastritis 06/28/2012   Mild  . HEADACHE 08/22/2007   Qualifier: Diagnosis of  By: Hassell Done FNP, Tori Milks    . Hyperglycemia 03/17/2017  . Lumbago 07/18/2007   Qualifier: Diagnosis of  By: Hassell Done FNP, Tori Milks    . Obesity   . Pelvic pain   . SKIN RASH 09/21/2007   Qualifier: Diagnosis of  By: Hassell Done FNP, Tori Milks    . Stress headaches   . Vitamin D deficiency     Past Surgical History:  Procedure Laterality Date  . APPENDECTOMY    . FOOT SURGERY Bilateral    Plantar fasciitis  . THYROIDECTOMY, PARTIAL     pt denies    There were no vitals filed for this visit.  Subjective Assessment - 12/06/17 1539    Subjective  The underwear sample to reduce gas sound helped.  I am still having a lot of urine and  drinking more water caused more leakage. Pt did not report pain today    Patient Stated Goals  be able to go to the bathroom 1x; no leakage    Currently in Pain?  --   did not report pain.                      [facility] Adult PT Treatment/Exercise - 12/06/17 0001      Neuro Re-ed    Neuro Re-ed Details   tactiel cues for pelvic contract and relax; cues for breathing to relax      Manual Therapy   Manual Therapy  Internal Pelvic Floor    Internal Pelvic Floor  pt informed and identity confirmed for internal tactile cues during contract and relax and bulge as mentioned above               PT Short Term Goals - 11/15/17 1449      PT SHORT TERM GOAL #1   Title  She will be independent with intial HEP    Status  On-going      PT SHORT TERM GOAL #2   Title  pt will  be report able to be finished going to the bathroom after only 2 attempts    Baseline  3 attempts    Status  On-going        PT Long Term Goals - 12/06/17 1511      PT LONG TERM GOAL #1   Title  pt will be able to report leakage 1x/day at the most    Status  On-going      PT LONG TERM GOAL #2   Title  pt will have 50% less volume of leakage due to improved strength    Baseline  needs cues to engage circular contraction, unable to feel urethra sphincter muscle contracting    Status  On-going      PT LONG TERM GOAL #3   Title  pt will demonstrate 5 x sit to stand in < or = to 30 sec demonstrating improved strength and stability for functional transfers    Status  On-going      PT LONG TERM GOAL #4   Title  Pt will report being able to empty bladder in one sitting without having to leave and come right back due to improved ability to relax pelvic floor correctly.    Status  On-going      PT LONG TERM GOAL #5   Title  pt will be ind with HEP for maintenance of strength            Plan - 12/06/17 1531    Clinical Impression Statement  Pt was able to relax muscle with tactile cues.  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  Pt will benefit from skilled PT to work on strengthening pelvic floor muscle for improved bladder control    PT Treatment/Interventions  Patient/family education;Passive range of motion;Manual techniques;Therapeutic exercise;Therapeutic activities;Electrical Stimulation;Moist Heat;Dry needling;ADLs/Self Care Home Management;Biofeedback;Neuromuscular re-education;Taping;Cryotherapy    PT Next Visit Plan  tactile cues to progress strength of pelvic floor    PT Home Exercise Plan  Access Code: [email or web address]    Consulted and Agree with Plan of Care  Patient       Patient will benefit from skilled therapeutic intervention in order to improve the following deficits and impairments:  Postural dysfunction, Decreased strength, Decreased activity tolerance, Pain, Increased muscle spasms, Decreased range of motion, Improper body mechanics, Impaired flexibility, Decreased coordination  Visit Diagnosis: Muscle weakness (generalized)  Other muscle spasm  Abnormal posture     Problem List Patient Active Problem List   Diagnosis Date Noted  . Healthcare maintenance 10/08/2017  . Urge incontinence of urine 05/22/2017  . Fibromyalgia 10/25/2012  . LLQ abdominal pain 09/08/2012  . Oligomenorrhea 05/20/2011  . Vitamin D deficiency 02/15/2009  . Calcaneal spur of foot, right 08/22/2007  . Lower back pain 07/18/2007    Zannie Cove, PT 12/06/2017, 3:40 PM  [facility] [address] [place], [place], [address] Phone: [phone]   Fax:  [phone]  Name: Kristin Joseph MRN: [id number] Date of Birth: 1970/05/19

## 2017-12-06 NOTE — Patient Instructions (Signed)
STRETCHING THE PELVIC FLOOR MUSCLES NO DILATOR  Supplies . Vaginal lubricant . Mirror (optional) . Gloves (optional) Positioning . Start in a semi-reclined position with your head propped up. Bend your knees and place your thumb or finger at the vaginal opening. Procedure . Apply a moderate amount of lubricant on the outer skin of your vagina, the labia minora.  Apply additional lubricant to your finger. Marland Kitchen Spread the skin away from the vaginal opening. Place the end of your finger at the opening. . Do a maximum contraction of the pelvic floor muscles. Tighten the vagina and the anus maximally and relax. . When you know they are relaxed, gently and slowly insert your finger into your vagina, directing your finger slightly downward, for 2-3 inches of insertion. . Relax and stretch the 6 o'clock position . Hold each stretch for _2 min__ and repeat __1_ time with rest breaks of _1__ seconds between each stretch. . Repeat the stretching in the 4 o'clock and 8 o'clock positions. . Total time should be _6__ minutes, _1__ x per day.  Note the amount of theme your were able to achieve and your tolerance to your finger in your vagina. . Once you have accomplished the techniques you may try them in standing with one foot resting on the tub, or in other positions.  This is a good stretch to do in the shower if you don't need to use lubricant.   Contract and hold 2 sec x 10  [facility] [address], [place], [place] [address] Phone # [phone] Fax [phone]

## 2017-12-13 ENCOUNTER — Ambulatory Visit: Payer: Medicaid Other | Admitting: Physical Therapy

## 2017-12-13 DIAGNOSIS — M6281 Muscle weakness (generalized): Secondary | ICD-10-CM | POA: Diagnosis not present

## 2017-12-13 DIAGNOSIS — R293 Abnormal posture: Secondary | ICD-10-CM

## 2017-12-13 DIAGNOSIS — M62838 Other muscle spasm: Secondary | ICD-10-CM

## 2017-12-13 NOTE — Patient Instructions (Signed)
Access Code: [email or web address]  URL: [email or web address]  Date: 12/13/2017  Prepared by: Lovett Calender   Exercises  Hooklying Small March - 10 reps - 1 sets - 3 sec hold - 2x daily - 7x weekly  Standing Hip Flexor Stretch - 2 reps - 1 sets - 30 sec hold - 3x daily - 7x weekly  Supine Pelvic Tilt - 10 reps - 3 sets - 1x daily - 7x weekly  Supine Figure 4 Piriformis Stretch - 3 reps - 1 sets - 30 sec hold - 1x daily - 7x weekly  Supine Hamstring Stretch - 3 reps - 1 sets - 30 sec hold - 1x daily - 7x weekly  Ball squeeze with Kegel - 10 reps - 1 sets - 3 sec hold - 3x daily - 7x weekly  Seated Pelvic Floor Contraction with Isometric Hip Adduction - 10 reps - 2 sets - 2 sec hold - 1x daily - 7x weekly

## 2017-12-13 NOTE — Therapy (Signed)
[facility] [address], [place], [place], [address] Phone: [phone]   Fax:  [phone]  Physical Therapy Treatment  Patient Details  Name: Kristin Joseph MRN: [id number] Date of Birth: 06-Oct-1970 Referring Provider (PT): Lars Mage, MD; Aldine Contes    Encounter Date: 12/13/2017  PT End of Session - 12/13/17 1547    Visit Number  6    Number of Visits  13    Date for PT Re-Evaluation  12/16/17    Authorization Type  Medicaid 12 visits until 12/26/17    Authorization - Visit Number  5    Authorization - Number of Visits  12    PT Start Time  8264    PT Stop Time  1615    PT Time Calculation (min)  32 min    Activity Tolerance  Patient tolerated treatment well       Past Medical History:  Diagnosis Date  . 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 Back pain   . CALLUSES, FEET, BILATERAL 08/08/2007   Qualifier: Diagnosis of  By: Hassell Done FNP, Tori Milks    . Depression   . FATIGUE 04/18/2008   Qualifier: Diagnosis of  By: Hassell Done FNP, Tori Milks    . Fibromyalgia 10/25/2012  . Gastritis 06/28/2012   Mild  . HEADACHE 08/22/2007   Qualifier: Diagnosis of  By: Hassell Done FNP, Tori Milks    . Hyperglycemia 03/17/2017  . Lumbago 07/18/2007   Qualifier: Diagnosis of  By: Hassell Done FNP, Tori Milks    . Obesity   . Pelvic pain   . SKIN RASH 09/21/2007   Qualifier: Diagnosis of  By: Hassell Done FNP, Tori Milks    . Stress headaches   . Vitamin D deficiency     Past Surgical History:  Procedure Laterality Date  . APPENDECTOMY    . FOOT SURGERY Bilateral    Plantar fasciitis  . THYROIDECTOMY, PARTIAL     pt denies    There were no vitals filed for this visit.  Subjective Assessment - 12/13/17 1606    Subjective  I have pain always that never changes.  It has gotten worse but it is constant that doesn't matter what I do    Patient Stated Goals  be able to go to the bathroom 1x; no  leakage    Currently in Pain?  Yes   no number given                      [facility] Adult PT Treatment/Exercise - 12/13/17 0001      Exercises   Exercises  Lumbar      Lumbar Exercises: Aerobic   Nustep  L2 x 6 min   cues to engage TrA and pelvic floor; status update     Lumbar Exercises: Seated   Long Arc Quad on Chair  Strengthening;Right;Left;20 reps;Weights   TrA and pelvic floor contraction   Sit to Stand  10 reps   with Pelvic floor contaction   Other Seated Lumbar Exercises  ball squeeze with kegel             PT Education - 12/13/17 1733    Education Details   Access Code: [email or web address]     Person(s) Educated  Patient    Methods  Explanation;Verbal cues;Demonstration;Handout    Comprehension  Verbalized understanding;Returned demonstration       PT Short Term Goals - 12/13/17 1558  PT SHORT TERM GOAL #1   Title  She will be independent with intial HEP    Status  Achieved        PT Long Term Goals - 12/13/17 1548      PT LONG TERM GOAL #1   Title  pt will be able to report leakage 1x/day at the most    Baseline  all the time      PT LONG TERM GOAL #2   Title  pt will have 50% less volume of leakage due to improved strength    Baseline  5% better      PT LONG TERM GOAL #3   Title  pt will demonstrate 5 x sit to stand in < or = to 30 sec demonstrating improved strength and stability for functional transfers    Baseline  39 sec    Status  On-going      PT LONG TERM GOAL #4   Title  Pt will report being able to empty bladder in one sitting without having to leave and come right back due to improved ability to relax pelvic floor correctly.    Baseline  3x    Status  On-going      PT LONG TERM GOAL #5   Title  pt will be ind with HEP for maintenance of strength    Status  On-going            Plan - 12/13/17 1724    Clinical Impression Statement  Pt was late today.  She was able to add to HEP and progress strengtheing exercises.   She was able to feel a good contraction in seated position.  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  Pt will benefit from skilled PT to continue strengthening core and pelvic floor.    PT Treatment/Interventions  Patient/family education;Passive range of motion;Manual techniques;Therapeutic exercise;Therapeutic activities;Electrical Stimulation;Moist Heat;Dry needling;ADLs/Self Care Home Management;Biofeedback;Neuromuscular re-education;Taping;Cryotherapy    PT Home Exercise Plan  Access Code: [email or web address]    Consulted and Agree with Plan of Care  Patient       Patient will benefit from skilled therapeutic intervention in order to improve the following deficits and impairments:  Postural dysfunction, Decreased strength, Decreased activity tolerance, Pain, Increased muscle spasms, Decreased range of motion, Improper body mechanics, Impaired flexibility, Decreased coordination  Visit Diagnosis: Muscle weakness (generalized)  Other muscle spasm  Abnormal posture     Problem List Patient Active Problem List   Diagnosis Date Noted  . Healthcare maintenance 10/08/2017  . Urge incontinence of urine 05/22/2017  . Fibromyalgia 10/25/2012  . LLQ abdominal pain 09/08/2012  . Oligomenorrhea 05/20/2011  . Vitamin D deficiency 02/15/2009  . Calcaneal spur of foot, right 08/22/2007  . Lower back pain 07/18/2007    Zannie Cove, PT 12/13/2017, 5:35 PM  [facility] [address] [place], [place], [address] Phone: [phone]   Fax:  [phone]  Name: Kristin Joseph MRN: [id number] Date of Birth: Jan 04, 1971

## 2017-12-21 ENCOUNTER — Ambulatory Visit: Payer: Medicaid Other | Attending: Internal Medicine | Admitting: Physical Therapy

## 2017-12-21 DIAGNOSIS — R293 Abnormal posture: Secondary | ICD-10-CM | POA: Insufficient documentation

## 2017-12-21 DIAGNOSIS — M6281 Muscle weakness (generalized): Secondary | ICD-10-CM | POA: Diagnosis present

## 2017-12-21 DIAGNOSIS — M62838 Other muscle spasm: Secondary | ICD-10-CM | POA: Insufficient documentation

## 2017-12-21 NOTE — Therapy (Signed)
[facility] [address], [place], [place], [address] Phone: [phone]   Fax:  [phone]  Physical Therapy Treatment  Patient Details  Name: Kristin Joseph MRN: [id number] Date of Birth: 10-06-1970 Referring Provider (PT): Lars Mage, MD; Aldine Contes    Encounter Date: 12/21/2017  PT End of Session - 12/21/17 1529    Visit Number  7    Number of Visits  13    Date for PT Re-Evaluation  02/15/18    Authorization - Visit Number  6    Authorization - Number of Visits  12    PT Start Time  0160    PT Stop Time  1610    PT Time Calculation (min)  41 min    Activity Tolerance  Patient tolerated treatment well    Behavior During Therapy  [facility] for tasks assessed/performed       Past Medical History:  Diagnosis Date  . 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 Back pain   . CALLUSES, FEET, BILATERAL 08/08/2007   Qualifier: Diagnosis of  By: Hassell Done FNP, Tori Milks    . Depression   . FATIGUE 04/18/2008   Qualifier: Diagnosis of  By: Hassell Done FNP, Tori Milks    . Fibromyalgia 10/25/2012  . Gastritis 06/28/2012   Mild  . HEADACHE 08/22/2007   Qualifier: Diagnosis of  By: Hassell Done FNP, Tori Milks    . Hyperglycemia 03/17/2017  . Lumbago 07/18/2007   Qualifier: Diagnosis of  By: Hassell Done FNP, Tori Milks    . Obesity   . Pelvic pain   . SKIN RASH 09/21/2007   Qualifier: Diagnosis of  By: Hassell Done FNP, Tori Milks    . Stress headaches   . Vitamin D deficiency     Past Surgical History:  Procedure Laterality Date  . APPENDECTOMY    . FOOT SURGERY Bilateral    Plantar fasciitis  . THYROIDECTOMY, PARTIAL     pt denies    There were no vitals filed for this visit.  Subjective Assessment - 12/21/17 1535    Subjective  I feel like it is helping me.  If I drink water about 3-4 glasses then I have leakage that comes all the way to my shoes.  I also always have to go to the bathroom    Patient Stated Goals  be able to go to the bathroom 1x; no leakage    Currently in Pain?  Yes    Pain Score  6     Pain Location  Pelvis    Pain Orientation  Lower    Pain Descriptors / Indicators  Aching    Pain Radiating Towards  to back and all the way up to back of head    Pain Onset  More than a month ago    Pain Frequency  Intermittent    Multiple Pain Sites  No         [facility] PT Assessment - 12/21/17 0001      Assessment   Medical Diagnosis  N39.41 (ICD-10-CM) - Urge incontinence of urine    Referring Provider (PT)  Lars Mage, MD; NARENDRA, [facility]       Posture/Postural Control   Postural Limitations  Anterior pelvic tilt;Increased lumbar lordosis;Rounded Shoulders      Strength   Overall Strength Comments  Rt LE 4/5; Lt LE 4-/5      Palpation   Palpation comment  very tender to glute attachments to sacrum  and coccyx; sacral distraction increased pain                Pelvic Floor Special Questions - 12/21/17 0001    Pelvic Floor Internal Exam  pt identity confirmed and internal soft tissue assessment performed with patient's informed consent given    Exam Type  Vaginal    Strength  Flicker   increased pain during squeeze from back all the way to head   Strength # of seconds  1    Tone  high        [facility] Adult PT Treatment/Exercise - 12/21/17 0001      Manual Therapy   Internal Pelvic Floor  pt informed and identity confirmed for internal tactile cues during contract and relax and bulge as mentioned above; soft tissue mobs to stretch bulbocave               PT Short Term Goals - 12/13/17 1558      PT SHORT TERM GOAL #1   Title  She will be independent with intial HEP    Status  Achieved        PT Long Term Goals - 12/21/17 1730      PT LONG TERM GOAL #1   Title  pt will be able to report leakage 1x/day at the most    Baseline  all the time    Status  On-going      PT LONG TERM GOAL #2   Title  pt will have 50% less volume of  leakage due to improved strength    Baseline  5% better    Status  On-going      PT LONG TERM GOAL #3   Title  pt will demonstrate 5 x sit to stand in < or = to 30 sec demonstrating improved strength and stability for functional transfers    Baseline  45 sec    Status  On-going      PT LONG TERM GOAL #4   Title  Pt will report being able to empty bladder in one sitting without having to leave and come right back due to improved ability to relax pelvic floor correctly.    Baseline  3x    Status  On-going      PT LONG TERM GOAL #5   Title  pt will be ind with HEP for maintenance of strength    Baseline  learning, unable to get pelvic floor contraction    Status  On-going            Plan - 12/21/17 1724    Clinical Impression Statement  Pt demosntrates slightly improved LE strength.  At this time, she has recieved 6 out of 12 treatments.  She was unable to use all of her visits due to scheduling conflicts.  She will benefit from skilled PT as she has made some progress with strength but continues to demonstrate very weak pelvic floor.  Upon further exam of her soft tissue, she has severe tenderness of her glutes and lumbar paraspinals. Pt gets pain referred up to her neck.  She has chronic pain and will need more skilled PT to help her progress towards improved function and address remaining impairments    Rehab Potential  Good    PT Frequency  1x / week    PT Duration  8 weeks    PT Treatment/Interventions  Patient/family education;Passive range of motion;Manual techniques;Therapeutic exercise;Therapeutic activities;Electrical Stimulation;Moist Heat;Dry needling;ADLs/Self Care Home Management;Biofeedback;Neuromuscular re-education;Taping;Cryotherapy  PT Next Visit Plan  tactile cues to progress strength of pelvic floor, STM to glutes and lumbar, LE and lumbar ROM and core strength    Consulted and Agree with Plan of Care  Patient       Patient will benefit from skilled  therapeutic intervention in order to improve the following deficits and impairments:  Postural dysfunction, Decreased strength, Decreased activity tolerance, Pain, Increased muscle spasms, Decreased range of motion, Improper body mechanics, Impaired flexibility, Decreased coordination  Visit Diagnosis: Muscle weakness (generalized)  Other muscle spasm  Abnormal posture     Problem List Patient Active Problem List   Diagnosis Date Noted  . Healthcare maintenance 10/08/2017  . Urge incontinence of urine 05/22/2017  . Fibromyalgia 10/25/2012  . LLQ abdominal pain 09/08/2012  . Oligomenorrhea 05/20/2011  . Vitamin D deficiency 02/15/2009  . Calcaneal spur of foot, right 08/22/2007  . Lower back pain 07/18/2007    Zannie Cove, PT 12/21/2017, 5:33 PM  [facility] [address], [place], [place], [address] Phone: [phone]   Fax:  [phone]  Name: Kristin Joseph MRN: [id number] Date of Birth: 11/05/1970

## 2017-12-31 ENCOUNTER — Ambulatory Visit: Payer: Medicaid Other

## 2018-01-05 ENCOUNTER — Ambulatory Visit: Payer: Medicaid Other | Admitting: Physical Therapy

## 2018-01-05 DIAGNOSIS — R293 Abnormal posture: Secondary | ICD-10-CM

## 2018-01-05 DIAGNOSIS — M6281 Muscle weakness (generalized): Secondary | ICD-10-CM | POA: Diagnosis not present

## 2018-01-05 DIAGNOSIS — M62838 Other muscle spasm: Secondary | ICD-10-CM

## 2018-01-05 NOTE — Therapy (Signed)
[facility] [address], [place], [place], [address] Phone: [phone]   Fax:  [phone]  Physical Therapy Treatment  Patient Details  Name: Kristin Joseph MRN: [id number] Date of Birth: 07/20/1970 Referring Provider (PT): Lars Mage, MD; Aldine Contes    Encounter Date: 01/05/2018  PT End of Session - 01/05/18 1459    Visit Number  8    Number of Visits  --    Date for PT Re-Evaluation  02/15/18    Authorization - Visit Number  1    Authorization - Number of Visits  3    PT Start Time  1500    PT Stop Time  1547    PT Time Calculation (min)  47 min    Activity Tolerance  Patient tolerated treatment well    Behavior During Therapy  [facility] for tasks assessed/performed       Past Medical History:  Diagnosis Date  . 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 Back pain   . CALLUSES, FEET, BILATERAL 08/08/2007   Qualifier: Diagnosis of  By: Hassell Done FNP, Tori Milks    . Depression   . FATIGUE 04/18/2008   Qualifier: Diagnosis of  By: Hassell Done FNP, Tori Milks    . Fibromyalgia 10/25/2012  . Gastritis 06/28/2012   Mild  . HEADACHE 08/22/2007   Qualifier: Diagnosis of  By: Hassell Done FNP, Tori Milks    . Hyperglycemia 03/17/2017  . Lumbago 07/18/2007   Qualifier: Diagnosis of  By: Hassell Done FNP, Tori Milks    . Obesity   . Pelvic pain   . SKIN RASH 09/21/2007   Qualifier: Diagnosis of  By: Hassell Done FNP, Tori Milks    . Stress headaches   . Vitamin D deficiency     Past Surgical History:  Procedure Laterality Date  . APPENDECTOMY    . FOOT SURGERY Bilateral    Plantar fasciitis  . THYROIDECTOMY, PARTIAL     pt denies    There were no vitals filed for this visit.  Subjective Assessment - 01/05/18 1511    Subjective  Pt states she is always going to the bathroom.  States she is going 4-5x each time she goes to the bathroom.  When I drink water I have to go to the bathroom immediately   Pt did not     Limitations  House hold activities    Patient Stated Goals  be able to go to the bathroom 1x; no leakage                       [facility] Adult PT Treatment/Exercise - 01/05/18 0001      Self-Care   Other Self-Care Comments   educated on stim machine for strengthening and vaginal moisturizers      Neuro Re-ed    Neuro Re-ed Details   pt informed and identity confirmed for internal tactile cues during contraction for increased strength and to ensure patient is not bulging; tactile cues for pelvic contract and relax; cues for breathing to relax      Lumbar Exercises: Supine   Other Supine Lumbar Exercises  pelvic floor squeeze      Manual Therapy   Internal Pelvic Floor  --             PT Education - 01/05/18 1539    Education Details  pelvic floor stim for strengthening and moisturizers    Person(s) Educated  Patient  Methods  Explanation;Handout;Verbal cues    Comprehension  Verbalized understanding       PT Short Term Goals - 12/13/17 1558      PT SHORT TERM GOAL #1   Title  She will be independent with intial HEP    Status  Achieved        PT Long Term Goals - 01/05/18 1551      PT LONG TERM GOAL #1   Title  pt will be able to report leakage 1x/day at the most    Baseline  all the time    Status  On-going      PT LONG TERM GOAL #2   Title  pt will have 50% less volume of leakage due to improved strength    Status  On-going      PT LONG TERM GOAL #3   Title  pt will demonstrate 5 x sit to stand in < or = to 30 sec demonstrating improved strength and stability for functional transfers    Status  On-going      PT LONG TERM GOAL #4   Title  Pt will report being able to empty bladder in one sitting without having to leave and come right back due to improved ability to relax pelvic floor correctly.    Baseline  3-5x    Status  On-going      PT LONG TERM GOAL #5   Title  pt will be ind with HEP for maintenance of strength     Status  On-going            Plan - 01/05/18 1548    Clinical Impression Statement  Pt arrived 20 minutes late today. Pt continues to have difficulty with engaging pelvic floor muscles . She was able to perform contaction briefly with tactile cues and tapping to muscles.  She needed a lot of cues initially to prevent bearing down and bulging pelvic floor.  Pt was educated in [place] for pelvic floor due to very weak muslces.  Pt has rectocele and cystocele observed in pelvic floor today.  Pt will benefit from skilledPT to address weakness and muscle coordination.    PT Treatment/Interventions  Patient/family education;Passive range of motion;Manual techniques;Therapeutic exercise;Therapeutic activities;Electrical Stimulation;Moist Heat;Dry needling;ADLs/Self Care Home Management;Biofeedback;Neuromuscular re-education;Taping;Cryotherapy    PT Next Visit Plan  f/u on stim and tactile cues when doing exercises at home; tactile cues to progress strength of pelvic floor, STM to glutes and lumbar, LE and lumbar ROM and core strength    PT Home Exercise Plan  Access Code: [email or web address]    Consulted and Agree with Plan of Care  Patient       Patient will benefit from skilled therapeutic intervention in order to improve the following deficits and impairments:  Postural dysfunction, Decreased strength, Decreased activity tolerance, Pain, Increased muscle spasms, Decreased range of motion, Improper body mechanics, Impaired flexibility, Decreased coordination  Visit Diagnosis: Muscle weakness (generalized)  Other muscle spasm  Abnormal posture     Problem List Patient Active Problem List   Diagnosis Date Noted  . Healthcare maintenance 10/08/2017  . Urge incontinence of urine 05/22/2017  . Fibromyalgia 10/25/2012  . LLQ abdominal pain 09/08/2012  . Oligomenorrhea 05/20/2011  . Vitamin D deficiency 02/15/2009  . Calcaneal spur of foot, right 08/22/2007  . Lower back pain 07/18/2007    Zannie Cove, PT 01/05/2018, 4:01 PM  [facility] [address], [place], [place], [address] Phone: [phone]  Fax:  [phone]  Name: Kristin Joseph MRN: [id number] Date of Birth: 1970-12-15

## 2018-01-05 NOTE — Patient Instructions (Signed)
     Moisturizers . They are used in the vagina to hydrate the mucous membrane that make up the vaginal canal. . Designed to keep a more normal acid balance (ph) . Once placed in the vagina, it will last between two to three days.  . Use 2-3 times per week at bedtime and last longer than 60 min. . Ingredients to avoid is glycerin and fragrance, can increase chance of infection . Should not be used just before sex due to causing irritation . Most are gels administered either in a tampon-shaped applicator or as a vaginal suppository. They are non-hormonal.   Types of Moisturizers . Samul Dada- drug store . Vitamin E vaginal suppositories- Whole foods, Amazon . Moist Again . Coconut oil- can break down condoms . Julva- (Do no use if on Tamoxifen) amazon . Yes moisturizer- amazon . NeuEve Silk , NeuEve Silver for menopausal or over 65 (if have severe vaginal atrophy or cancer treatments use NeuEve Silk for  1 month than move to [employer]eps[employer])- [employer], MapleFlower.dk . Olive and Bee intimate cream- www.oliveandbee.com.au . Mae vaginal moisturizer- Amazon  Creams to use externally on the Vulva area  Albertson's (good for for cancer patients that had radiation to the area)- [place] or [employer].FlyingBasics.com.br  V-magic cream - amazon  Julva-amazon  Vital "V Wild Yam salve ( help moisturize and help with thinning vulvar area, does have Graball by Damiva labial moisturizer ([place],    Things to avoid in the vaginal area . Do not use things to irritate the vulvar area . No lotions just specialized creams for the vulva area- Neogyn, V-magic, No soaps; can use Aveeno or Calendula cleanser if needed. Must be gentle . No deodorants . No douches . Good to sleep without underwear to let the vaginal area to air out . No scrubbing: spread the lips to let warm water rinse over labias and pat dry  [facility] [address] [place], [place] [address] Phone # [phone] Fax [phone]

## 2018-01-13 ENCOUNTER — Ambulatory Visit: Payer: Medicaid Other | Admitting: Physical Therapy

## 2018-01-17 ENCOUNTER — Encounter

## 2018-02-03 ENCOUNTER — Encounter: Payer: Self-pay | Admitting: Physical Therapy

## 2018-02-03 ENCOUNTER — Ambulatory Visit: Payer: Medicaid Other | Attending: Internal Medicine | Admitting: Physical Therapy

## 2018-02-03 DIAGNOSIS — M6281 Muscle weakness (generalized): Secondary | ICD-10-CM | POA: Insufficient documentation

## 2018-02-03 DIAGNOSIS — R293 Abnormal posture: Secondary | ICD-10-CM | POA: Diagnosis present

## 2018-02-03 DIAGNOSIS — M62838 Other muscle spasm: Secondary | ICD-10-CM | POA: Diagnosis present

## 2018-02-03 NOTE — Patient Instructions (Signed)
Access Code: [email or web address]  URL: [email or web address]  Date: 02/03/2018  Prepared by: Lovett Calender   Exercises  Hooklying Small March - 10 reps - 1 sets - 3 sec hold - 2x daily - 7x weekly  Standing Hip Flexor Stretch - 2 reps - 1 sets - 30 sec hold - 3x daily - 7x weekly  Supine Pelvic Tilt - 10 reps - 3 sets - 1x daily - 7x weekly  Supine Figure 4 Piriformis Stretch - 3 reps - 1 sets - 30 sec hold - 1x daily - 7x weekly  Supine Hamstring Stretch - 3 reps - 1 sets - 30 sec hold - 1x daily - 7x weekly  Ball squeeze with Kegel - 10 reps - 1 sets - 3 sec hold - 3x daily - 7x weekly  Seated Pelvic Floor Contraction with Isometric Hip Adduction - 10 reps - 2 sets - 2 sec hold - 1x daily - 7x weekly  Bent Knee Fallouts - 10 reps - 3 sets - 1x daily - 7x weekly  Clamshell - 10 reps - 3 sets - 1x daily - 7x weekly  Hooklying clam with kegel - 10 reps - 3 sets - 1x daily - 7x weekly

## 2018-02-03 NOTE — Therapy (Signed)
[facility] [address] [place], [place], [address] Phone: [phone]   Fax:  [phone]  Physical Therapy Treatment  Patient Details  Name: Kristin Joseph MRN: [id number] Date of Birth: Jul 15, 1970 Referring Provider (PT): Lars Mage, MD; Dareen Piano, [place]    Encounter Date: 02/03/2018  PT End of Session - 02/03/18 1449    Visit Number  9    Number of Visits  13    Date for PT Re-Evaluation  02/15/18    Authorization Type  Medicaid 3 visits until 02/19/18    Authorization - Visit Number  1    Authorization - Number of Visits  3    PT Start Time  6546    PT Stop Time  5035    PT Time Calculation (min)  31 min    Activity Tolerance  Patient limited by pain;Patient limited by fatigue    Behavior During Therapy  [facility] for tasks assessed/performed       Past Medical History:  Diagnosis Date  . 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 Back pain   . CALLUSES, FEET, BILATERAL 08/08/2007   Qualifier: Diagnosis of  By: Hassell Done FNP, Tori Milks    . Depression   . FATIGUE 04/18/2008   Qualifier: Diagnosis of  By: Hassell Done FNP, Tori Milks    . Fibromyalgia 10/25/2012  . Gastritis 06/28/2012   Mild  . HEADACHE 08/22/2007   Qualifier: Diagnosis of  By: Hassell Done FNP, Tori Milks    . Hyperglycemia 03/17/2017  . Lumbago 07/18/2007   Qualifier: Diagnosis of  By: Hassell Done FNP, Tori Milks    . Obesity   . Pelvic pain   . SKIN RASH 09/21/2007   Qualifier: Diagnosis of  By: Hassell Done FNP, Tori Milks    . Stress headaches   . Vitamin D deficiency     Past Surgical History:  Procedure Laterality Date  . APPENDECTOMY    . FOOT SURGERY Bilateral    Plantar fasciitis  . THYROIDECTOMY, PARTIAL     pt denies    There were no vitals filed for this visit.  Subjective Assessment - 02/03/18 1437    Subjective  Pt arrived 16 minutes late.  Pt reports she feels pain when doing some of the exercises.   Initially at start of treatment she states she states    Currently in Pain?  Yes    Pain Score  7     Pain Location  Buttocks    Pain Orientation  Right;Left    Pain Descriptors / Indicators  Sore    Pain Type  Chronic pain    Pain Onset  More than a month ago    Pain Frequency  Intermittent    Aggravating Factors   doing kegels    Multiple Pain Sites  No                       [facility] Adult PT Treatment/Exercise - 02/03/18 0001      Neuro Re-ed    Neuro Re-ed Details   pt informed and identity confirmed for internal tactile cues during contraction for increased strength and to ensure patient is not bulging; tactile cues for pelvic contract and relax; cues for breathing to relax      Lumbar Exercises: Supine   Glut Set  Limitations    Glut Set Limitations  kegel with and without ball - 2 second holds    Clam  20 reps   kegel during knees apart cues to relax between sets   Bridge  5 reps   small bridge, increased pain in buttock and low back            PT Education - 02/03/18 1449    Education Details   Access Code: [email or web address]     Person(s) Educated  Patient    Methods  Explanation;Demonstration;Handout;Verbal cues    Comprehension  Verbalized understanding;Returned demonstration       PT Short Term Goals - 12/13/17 1558      PT SHORT TERM GOAL #1   Title  She will be independent with intial HEP    Status  Achieved        PT Long Term Goals - 01/05/18 1551      PT LONG TERM GOAL #1   Title  pt will be able to report leakage 1x/day at the most    Baseline  all the time    Status  On-going      PT LONG TERM GOAL #2   Title  pt will have 50% less volume of leakage due to improved strength    Status  On-going      PT LONG TERM GOAL #3   Title  pt will demonstrate 5 x sit to stand in < or = to 30 sec demonstrating improved strength and stability for functional transfers    Status  On-going      PT LONG TERM GOAL #4   Title  Pt will report being  able to empty bladder in one sitting without having to leave and come right back due to improved ability to relax pelvic floor correctly.    Baseline  3-5x    Status  On-going      PT LONG TERM GOAL #5   Title  pt will be ind with HEP for maintenance of strength    Status  On-going            Plan - 02/03/18 1720    Clinical Impression Statement  Pt arrived 16 min late today.  Pt was able to contract pelvic floor but has pain in buttocks and low back when she does.  Pt was able to left pelvic floor with minimal cues today.  She can hold for 2-3 seconds.  She still has what appears to be significant prolapse anterior and posterior walls.  Pt was told to talk to her healthcare provider about potentially referring to urology to assess any prolapse.  Pt will continue to benefit from skilled PT to work on pelvic floor strength.    PT Treatment/Interventions  Patient/family education;Passive range of motion;Manual techniques;Therapeutic exercise;Therapeutic activities;Electrical Stimulation;Moist Heat;Dry needling;ADLs/Self Care Home Management;Biofeedback;Neuromuscular re-education;Taping;Cryotherapy    PT Next Visit Plan  f/u on stim and tactile cues when doing exercises at home; tactile cues to progress strength of pelvic floor, STM to glutes and lumbar, LE and lumbar ROM and core strength    PT Home Exercise Plan  Access Code: [email or web address]    Consulted and Agree with Plan of Care  Patient       Patient will benefit from skilled therapeutic intervention in order to improve the following deficits and impairments:  Postural dysfunction, Decreased strength, Decreased activity tolerance, Pain, Increased muscle spasms, Decreased range of motion, Improper body mechanics, Impaired flexibility, Decreased coordination  Visit Diagnosis: No diagnosis found.     Problem List Patient Active Problem List   Diagnosis Date Noted  . Healthcare maintenance 10/08/2017  .  Urge incontinence of urine  05/22/2017  . Fibromyalgia 10/25/2012  . LLQ abdominal pain 09/08/2012  . Oligomenorrhea 05/20/2011  . Vitamin D deficiency 02/15/2009  . Calcaneal spur of foot, right 08/22/2007  . Lower back pain 07/18/2007    Zannie Cove, PT 02/03/2018, 5:36 PM  [facility] [address] [place], [place], [address] Phone: [phone]   Fax:  [phone]  Name: Kristin Joseph MRN: [id number] Date of Birth: 11-Feb-1970

## 2018-02-07 ENCOUNTER — Ambulatory Visit: Payer: Medicaid Other | Admitting: Physical Therapy

## 2018-02-07 DIAGNOSIS — M62838 Other muscle spasm: Secondary | ICD-10-CM

## 2018-02-07 DIAGNOSIS — M6281 Muscle weakness (generalized): Secondary | ICD-10-CM

## 2018-02-07 DIAGNOSIS — R293 Abnormal posture: Secondary | ICD-10-CM

## 2018-02-07 NOTE — Therapy (Signed)
[facility] [address] [place], [place], [address] Phone: [phone]   Fax:  [phone]  Physical Therapy Treatment  Patient Details  Name: Isadore Bokhari MRN: [id number] Date of Birth: November 24, 1970 Referring Provider (PT): Lars Mage, MD; Aldine Contes    Encounter Date: 02/07/2018  PT End of Session - 02/07/18 1455    Visit Number  10    Date for PT Re-Evaluation  02/15/18    Authorization Type  Medicaid 3 visits until 02/19/18    Authorization - Visit Number  2    Authorization - Number of Visits  3    PT Start Time  1500   pt arrived late   PT Stop Time  1529    PT Time Calculation (min)  29 min    Activity Tolerance  Patient limited by pain;Patient limited by fatigue    Behavior During Therapy  [facility] for tasks assessed/performed       Past Medical History:  Diagnosis Date  . 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 Back pain   . CALLUSES, FEET, BILATERAL 08/08/2007   Qualifier: Diagnosis of  By: Hassell Done FNP, Tori Milks    . Depression   . FATIGUE 04/18/2008   Qualifier: Diagnosis of  By: Hassell Done FNP, Tori Milks    . Fibromyalgia 10/25/2012  . Gastritis 06/28/2012   Mild  . HEADACHE 08/22/2007   Qualifier: Diagnosis of  By: Hassell Done FNP, Tori Milks    . Hyperglycemia 03/17/2017  . Lumbago 07/18/2007   Qualifier: Diagnosis of  By: Hassell Done FNP, Tori Milks    . Obesity   . Pelvic pain   . SKIN RASH 09/21/2007   Qualifier: Diagnosis of  By: Hassell Done FNP, Tori Milks    . Stress headaches   . Vitamin D deficiency     Past Surgical History:  Procedure Laterality Date  . APPENDECTOMY    . FOOT SURGERY Bilateral    Plantar fasciitis  . THYROIDECTOMY, PARTIAL     pt denies    There were no vitals filed for this visit.  Subjective Assessment - 02/07/18 1529    Subjective  Pt arrived late today.  She reports no change in her urinary symptoms today    Limitations  House hold  activities    Patient Stated Goals  be able to go to the bathroom 1x; no leakage                       [facility] Adult PT Treatment/Exercise - 02/07/18 0001      Neuro Re-ed    Neuro Re-ed Details   circles both ways sitting on green ball, tactile cues on the outside of her clothes for correct pelvic floor conttraction      Lumbar Exercises: Supine   Pelvic Tilt  20 reps   with kegel   Glut Set Limitations  pelvic circles both ways    Bridge  10 reps   with pillow under pelvis              PT Short Term Goals - 02/07/18 1507      PT SHORT TERM GOAL #2   Title  pt will be report able to be finished going to the bathroom after only 2 attempts    Baseline  4 attempts    Status  On-going        PT Long Term Goals - 02/07/18 1507  PT LONG TERM GOAL #1   Title  pt will be able to report leakage 1x/day at the most    Baseline  all the time    Status  On-going      PT LONG TERM GOAL #3   Title  pt will demonstrate 5 x sit to stand in < or = to 30 sec demonstrating improved strength and stability for functional transfers    Baseline  33 sec down from 45    Status  On-going      PT LONG TERM GOAL #4   Title  Pt will report being able to empty bladder in one sitting without having to leave and come right back due to improved ability to relax pelvic floor correctly.    Baseline  3-5x    Status  On-going      PT LONG TERM GOAL #5   Title  pt will be ind with HEP for maintenance of strength    Baseline  is able to do pelvic floor contraction    Status  On-going            Plan - 02/07/18 1531    Clinical Impression Statement  Pt needed cues to not bulge and use pressure down when trying to kegel . She did show improvement on 5 x sit to stand from 45 seconds to 33 sec. Pt was recommended to follow up with her doctor due to lack of progress with her symptoms.  Pt will benefit from skilled PT to ensure successful transition to HEP for continueing to  strengthen pelvic floor.    PT Treatment/Interventions  Patient/family education;Passive range of motion;Manual techniques;Therapeutic exercise;Therapeutic activities;Electrical Stimulation;Moist Heat;Dry needling;ADLs/Self Care Home Management;Biofeedback;Neuromuscular re-education;Taping;Cryotherapy    PT Next Visit Plan  f/u on goals, HEP, contract without bulging, 5xsit to stand, f/u on doctor's visit    PT Home Exercise Plan  Access Code: [email or web address]    Consulted and Agree with Plan of Care  Patient       Patient will benefit from skilled therapeutic intervention in order to improve the following deficits and impairments:  Postural dysfunction, Decreased strength, Decreased activity tolerance, Pain, Increased muscle spasms, Decreased range of motion, Improper body mechanics, Impaired flexibility, Decreased coordination  Visit Diagnosis: Muscle weakness (generalized)  Other muscle spasm  Abnormal posture     Problem List Patient Active Problem List   Diagnosis Date Noted  . Healthcare maintenance 10/08/2017  . Urge incontinence of urine 05/22/2017  . Fibromyalgia 10/25/2012  . LLQ abdominal pain 09/08/2012  . Oligomenorrhea 05/20/2011  . Vitamin D deficiency 02/15/2009  . Calcaneal spur of foot, right 08/22/2007  . Lower back pain 07/18/2007    Zannie Cove, PT 02/07/2018, 5:08 PM  [facility] [address] [place], [place], [address] Phone: [phone]   Fax:  [phone]  Name: Brenly Trawick MRN: [id number] Date of Birth: Apr 05, 1970

## 2018-02-14 ENCOUNTER — Ambulatory Visit: Payer: Medicaid Other | Admitting: Physical Therapy

## 2018-02-14 ENCOUNTER — Encounter: Payer: Self-pay | Admitting: Physical Therapy

## 2018-02-14 DIAGNOSIS — M62838 Other muscle spasm: Secondary | ICD-10-CM

## 2018-02-14 DIAGNOSIS — M6281 Muscle weakness (generalized): Secondary | ICD-10-CM

## 2018-02-14 DIAGNOSIS — R293 Abnormal posture: Secondary | ICD-10-CM

## 2018-02-14 NOTE — Therapy (Signed)
[facility] [address] [place], [place], [address] Phone: [phone]   Fax:  [phone]  Physical Therapy Treatment  Patient Details  Name: Kristin Joseph MRN: [id number] Date of Birth: 09/21/1970 Referring Provider (PT): Lars Mage, MD; Dareen Piano, [place]    Encounter Date: 02/14/2018  PT End of Session - 02/14/18 1450    Visit Number  11    Date for PT Re-Evaluation  02/15/18    Authorization - Visit Number  3    Authorization - Number of Visits  3    PT Start Time  6283   spent a lot of time in bathroom after check in   PT Stop Time  1525    PT Time Calculation (min)  32 min    Activity Tolerance  Patient limited by pain;Patient limited by fatigue    Behavior During Therapy  [facility] for tasks assessed/performed       Past Medical History:  Diagnosis Date  . 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 Back pain   . CALLUSES, FEET, BILATERAL 08/08/2007   Qualifier: Diagnosis of  By: Hassell Done FNP, Tori Milks    . Depression   . FATIGUE 04/18/2008   Qualifier: Diagnosis of  By: Hassell Done FNP, Tori Milks    . Fibromyalgia 10/25/2012  . Gastritis 06/28/2012   Mild  . HEADACHE 08/22/2007   Qualifier: Diagnosis of  By: Hassell Done FNP, Tori Milks    . Hyperglycemia 03/17/2017  . Lumbago 07/18/2007   Qualifier: Diagnosis of  By: Hassell Done FNP, Tori Milks    . Obesity   . Pelvic pain   . SKIN RASH 09/21/2007   Qualifier: Diagnosis of  By: Hassell Done FNP, Tori Milks    . Stress headaches   . Vitamin D deficiency     Past Surgical History:  Procedure Laterality Date  . APPENDECTOMY    . FOOT SURGERY Bilateral    Plantar fasciitis  . THYROIDECTOMY, PARTIAL     pt denies    There were no vitals filed for this visit.  Subjective Assessment - 02/14/18 1522    Subjective  Pt states she has all of the exercises printed.  She reports no changes or if not worse.                        [facility] Adult PT Treatment/Exercise - 02/14/18 0001      Self-Care   Other Self-Care Comments   discussed returning to MD to investigate spine involvement or issues that urology needs to address      Neuro Re-ed    Neuro Re-ed Details   tactile cues exteranlly for pelvic floor contraction - palpated but very week and needed some cues to stop from bulging      Exercises   Exercises  Other Exercises    Other Exercises   reviewed HEP               PT Short Term Goals - 02/07/18 1507      PT SHORT TERM GOAL #2   Title  pt will be report able to be finished going to the bathroom after only 2 attempts    Baseline  4 attempts    Status  On-going        PT Long Term Goals - 02/14/18 1501      PT LONG TERM GOAL #1   Title  pt will be able to report  leakage 1x/day at the most    Baseline  3-4x    Status  Not Met      PT LONG TERM GOAL #2   Title  pt will have 50% less volume of leakage due to improved strength    Baseline  5% better, overall no change    Status  Not Met      PT LONG TERM GOAL #3   Title  pt will demonstrate 5 x sit to stand in < or = to 30 sec demonstrating improved strength and stability for functional transfers    Baseline  33 sec down from 45    Status  Partially Met      PT LONG TERM GOAL #4   Title  Pt will report being able to empty bladder in one sitting without having to leave and come right back due to improved ability to relax pelvic floor correctly.    Baseline  3-5x    Status  Not Met      PT LONG TERM GOAL #5   Title  pt will be ind with HEP for maintenance of strength    Baseline  is able to do very weak pelvic floor contraction    Status  Partially Met            Plan - 02/14/18 1523    Clinical Impression Statement  Goals were reviewed as shown above.  Pt continues to have very weak pelvic floor contraction.  She was advised to follow up with her doctor about possibly needing imaging for  lumbar spine and possible referral to urologist due to persistent and worsening symptoms.    PT Treatment/Interventions  Patient/family education;Passive range of motion;Manual techniques;Therapeutic exercise;Therapeutic activities;Electrical Stimulation;Moist Heat;Dry needling;ADLs/Self Care Home Management;Biofeedback;Neuromuscular re-education;Taping;Cryotherapy    PT Next Visit Plan  discharged    PT Home Exercise Plan  Access Code: [email or web address]    Consulted and Agree with Plan of Care  Patient       Patient will benefit from skilled therapeutic intervention in order to improve the following deficits and impairments:  Postural dysfunction, Decreased strength, Decreased activity tolerance, Pain, Increased muscle spasms, Decreased range of motion, Improper body mechanics, Impaired flexibility, Decreased coordination  Visit Diagnosis: Muscle weakness (generalized)  Other muscle spasm  Abnormal posture     Problem List Patient Active Problem List   Diagnosis Date Noted  . Healthcare maintenance 10/08/2017  . Urge incontinence of urine 05/22/2017  . Fibromyalgia 10/25/2012  . LLQ abdominal pain 09/08/2012  . Oligomenorrhea 05/20/2011  . Vitamin D deficiency 02/15/2009  . Calcaneal spur of foot, right 08/22/2007  . Lower back pain 07/18/2007    Zannie Cove, PT 02/14/2018, 3:46 PM  [facility] [address] [place], [place], [address] Phone: [phone]   Fax:  [phone]  Name: Kristin Joseph MRN: [id number] Date of Birth: [birth year]  PHYSICAL THERAPY DISCHARGE SUMMARY  Visits from Start of Care: 4  Current functional level related to goals / functional outcomes: See above not met   Remaining deficits: See above   Education / Equipment: HEP and refer back to provider for further testing  Plan: Patient agrees to discharge.  Patient goals were not met. Patient is being discharged due to lack of  progress.  ?????    [employer], PT 02/14/18 3:46 PM

## 2018-03-01 ENCOUNTER — Ambulatory Visit: Payer: Medicaid Other

## 2018-03-02 ENCOUNTER — Ambulatory Visit: Payer: Medicaid Other

## 2018-03-08 ENCOUNTER — Encounter: Payer: Self-pay | Admitting: Internal Medicine

## 2018-03-08 ENCOUNTER — Other Ambulatory Visit: Payer: Self-pay

## 2018-03-08 ENCOUNTER — Ambulatory Visit: Payer: Medicaid Other | Admitting: Internal Medicine

## 2018-03-08 VITALS — BP 110/46 | HR 72 | Temp 98.0°F | Ht 66.0 in | Wt 197.6 lb

## 2018-03-08 DIAGNOSIS — Z79899 Other long term (current) drug therapy: Secondary | ICD-10-CM

## 2018-03-08 DIAGNOSIS — M797 Fibromyalgia: Secondary | ICD-10-CM | POA: Diagnosis present

## 2018-03-08 DIAGNOSIS — N3941 Urge incontinence: Secondary | ICD-10-CM | POA: Diagnosis not present

## 2018-03-08 DIAGNOSIS — G8929 Other chronic pain: Secondary | ICD-10-CM

## 2018-03-08 DIAGNOSIS — M5442 Lumbago with sciatica, left side: Secondary | ICD-10-CM

## 2018-03-08 DIAGNOSIS — M5441 Lumbago with sciatica, right side: Secondary | ICD-10-CM

## 2018-03-08 MED ORDER — GABAPENTIN 400 MG PO CAPS: 400.0000 mg | ORAL_CAPSULE | Freq: Two times a day (BID) | ORAL | 3 refills | Status: DC
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
## 2018-03-09 NOTE — Assessment & Plan Note (Signed)
Assessment: She continues to have persistent neck/back/bilateral shoulder pain despite use of a Cymbalta 60 mg daily and gabapentin 300 mg twice daily.  Her gabapentin was increased to 300 mg 3 times daily last year but then decreased back to her regular dose after she felt drowsy.  She is willing to try an increased gabapentin dose of 400 mg twice daily to see if this helps with her pain.  She would like to start a muscle relaxer but I recommended against this given that it will likely not help with her symptoms and can cause increasing drowsiness.  Plan: 1.  Increase gabapentin to 400 mg twice daily 2.  Continue Cymbalta 60 mg daily 3.  Continue physical therapy

## 2018-03-09 NOTE — Addendum Note (Signed)
Addended by: Carroll Sage on: 03/09/2018 01:22 PM   Modules accepted: Orders

## 2018-03-10 NOTE — Progress Notes (Signed)
Internal Medicine Clinic Attending  Case discussed with Dr. Prince at the time of the visit.  We reviewed the resident's history and exam and pertinent patient test results.  I agree with the assessment, diagnosis, and plan of care documented in the resident's note.   

## 2018-03-14 ENCOUNTER — Encounter: Payer: Self-pay | Admitting: *Deleted

## 2018-03-24 NOTE — Progress Notes (Signed)
   CC: Fibromyalgia  HPI:  [name] is a 48 y.o. with fibromyalgia, urge incontinence who presents for fibromyalgia follow-up. Please see problem based charting for evaluation, assessment, and plan.  Past Medical History:  Diagnosis Date  . 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 Back pain   . CALLUSES, FEET, BILATERAL 08/08/2007   Qualifier: Diagnosis of  By: Hassell Done FNP, Tori Milks    . Depression   . FATIGUE 04/18/2008   Qualifier: Diagnosis of  By: Hassell Done FNP, Tori Milks    . Fibromyalgia 10/25/2012  . Gastritis 06/28/2012   Mild  . HEADACHE 08/22/2007   Qualifier: Diagnosis of  By: Hassell Done FNP, Tori Milks    . Hyperglycemia 03/17/2017  . Lumbago 07/18/2007   Qualifier: Diagnosis of  By: Hassell Done FNP, Tori Milks    . Obesity   . Pelvic pain   . SKIN RASH 09/21/2007   Qualifier: Diagnosis of  By: Hassell Done FNP, Tori Milks    . Stress headaches   . Vitamin D deficiency    Review of Systems:    Review of Systems  Constitutional: Negative for chills, fever and weight loss.  Respiratory: Negative for shortness of breath.   Cardiovascular: Negative for chest pain.  Gastrointestinal: Negative for abdominal pain, nausea and vomiting.  Musculoskeletal: Positive for myalgias.  Neurological: Negative for dizziness and headaches.     Physical Exam:  Vitals:   03/25/18 1623  BP: 111/68  Pulse: 84  Temp: 97.7 F (36.5 C)  TempSrc: Oral  SpO2: 100%  Weight: 199 lb 6.4 oz (90.4 kg)   Physical Exam  Constitutional: She appears well-developed and well-nourished. No distress.  HENT:  Head: Normocephalic and atraumatic.  Cardiovascular: Normal rate, regular rhythm and normal heart sounds.  Respiratory: Effort normal and breath sounds normal. No respiratory distress. She has no wheezes.  GI: Soft. Bowel sounds are normal. She exhibits no distension. There is no abdominal tenderness.  Musculoskeletal:        General: Tenderness (to palpation of  bilateral shoulders, neck, knees) present. No edema.  Neurological: She is alert.  Skin: She is not diaphoretic.  Psychiatric: She has a normal mood and affect. Her behavior is normal. Judgment and thought content normal.    Assessment & Plan:   See Encounters Tab for problem based charting.  Patient seen with Dr. Dareen Piano

## 2018-03-25 ENCOUNTER — Ambulatory Visit (INDEPENDENT_AMBULATORY_CARE_PROVIDER_SITE_OTHER): Payer: Medicaid Other | Admitting: Internal Medicine

## 2018-03-25 ENCOUNTER — Other Ambulatory Visit: Payer: Self-pay

## 2018-03-25 ENCOUNTER — Encounter: Payer: Self-pay | Admitting: Internal Medicine

## 2018-03-25 VITALS — BP 111/68 | HR 84 | Temp 97.7°F | Wt 199.4 lb

## 2018-03-25 DIAGNOSIS — M797 Fibromyalgia: Secondary | ICD-10-CM | POA: Diagnosis present

## 2018-03-25 DIAGNOSIS — N3941 Urge incontinence: Secondary | ICD-10-CM

## 2018-03-25 DIAGNOSIS — Z0000 Encounter for general adult medical examination without abnormal findings: Secondary | ICD-10-CM

## 2018-03-25 DIAGNOSIS — Z79899 Other long term (current) drug therapy: Secondary | ICD-10-CM | POA: Diagnosis not present

## 2018-03-25 MED ORDER — TOLTERODINE TARTRATE 1 MG PO TABS: 1.0000 mg | ORAL_TABLET | Freq: Two times a day (BID) | ORAL | 1 refills | Status: AC

## 2018-03-25 MED ORDER — GABAPENTIN 300 MG PO CAPS: 600.0000 mg | ORAL_CAPSULE | Freq: Two times a day (BID) | ORAL | 0 refills | Status: AC

## 2018-03-25 NOTE — Patient Instructions (Addendum)
It was a pleasure to see you today Ms. Garretson. Please make the following changes:  -Please stop taking tramadol -Please stop taking oxybutynin -Please start taking tolterodine 1mg  bid  -I have referred you to physical therapy for your pains -I have referred you for cognitive behavioral therapy which should help you with your pains.   If you have any questions or concerns, please call our clinic at [phone] between 9am-5pm and after hours call [phone] and ask for the internal medicine resident on call. If you feel you are having a medical emergency please call 911.   Thank you, we look forward to help you remain healthy!  Lars Mage, MD Internal Medicine PGY2

## 2018-03-26 NOTE — Assessment & Plan Note (Addendum)
The patient stated that she has been having pain in her bilateral shoulders, knees, hips, and lower back. The patient is a refugee from [place] who has two children. One of her children has special needs. Due to her fibromyalgia and urge incontinence she has been having difficulty performing her duties at work.   Assessment and plan  The patient is currently getting Cymbalta 60 mg daily, gabapentin 300 mg thrice daily, tramadol 50mg  bid, and physical therapy.  -stopped tramadol due to risk of serotinergic sydrome given that the patient is also taking cymbalta  -increased the patients gabapentin from 300mg  tid to 600mg  bid -Referred to physical therapy

## 2018-03-26 NOTE — Assessment & Plan Note (Signed)
The patient states that she has been urinating on herself several times a day. I had referred her to pelvic physical therapy but she did not get much relief from that. She has also not gotten benefit from oxybutynin.   Assessment and plan  Will change patient's oxybuynin to tolterodine. Will follow up in 1 month to see if there is benefit.

## 2018-03-28 NOTE — Progress Notes (Signed)
Internal Medicine Clinic Attending  Case discussed with Dr. Chundi at the time of the visit.  We reviewed the resident's history and exam and pertinent patient test results.  I agree with the assessment, diagnosis, and plan of care documented in the resident's note. 

## 2018-03-29 ENCOUNTER — Telehealth: Payer: Self-pay | Admitting: Licensed Clinical Social Worker

## 2018-03-29 NOTE — Telephone Encounter (Signed)
Patient was contacted to schedule an appointment through [facility] given a referral from her PCP. Patient sought clarification around the referral and nature of therapy. [facility] intern provided information regarding the types of services that we could offer, including behavioral strategies to manage her pain or her mood. An appointment was scheduled for 03/31/18.   -Jackalyn Lombard, Behavioral Health Intern

## 2018-03-31 ENCOUNTER — Ambulatory Visit: Payer: Medicaid Other | Attending: Internal Medicine | Admitting: Physical Therapy

## 2018-03-31 ENCOUNTER — Ambulatory Visit (INDEPENDENT_AMBULATORY_CARE_PROVIDER_SITE_OTHER): Payer: Medicaid Other | Admitting: Licensed Clinical Social Worker

## 2018-03-31 DIAGNOSIS — F32A Depression, unspecified: Secondary | ICD-10-CM

## 2018-03-31 DIAGNOSIS — F329 Major depressive disorder, single episode, unspecified: Secondary | ICD-10-CM

## 2018-03-31 NOTE — BH Specialist Note (Signed)
Integrated Behavioral Health Initial Visit  MRN: [id number] Name: Kristin Joseph  Number of Vill[place] Clinician visits:: 1/6 Session Start time: 11:20  Session End time: 12:05 Total time: 45 minutes  Type of Service: [place] Interpretor:No.          SUBJECTIVE: Kristin Joseph is a 48 y.o. female  whom attended the session individually.  Patient was referred by Dr. Maricela Bo for needing CBT to learn how to cope with Fibromyalgia. Patient reports the following symptoms/concerns: pain daily, sleeping too much, and feeling down.  Duration of problem: around two years; Severity of problem: moderate  OBJECTIVE: Mood: Anxious and Depressed and Affect: Depressed Risk of harm to self or others: No plan to harm self or others  LIFE CONTEXT: Family and Social: Patient lives with her three children. Patient reported she is a single mother, and one of children has special needs. Patient moved from [place] around 10 years ago to the [place]. Patient attends church regularly, and uses her religion as a way to cope with her anxiety and depression.  School/Work: Patient reported she is unable to work due to not being able to stand or sit for long periods of time. Patient has not filed for disability at this time.  Self-Care: Patient reported she is in pain everyday. 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 Patient reported the effects were not long lasting, and would return once she stopped physical therapy. Patient applies hot towels to her legs to reduce the pain. Patient reported she sleeps more than she feels she should, and believes this is due to her medication.  Life Changes: No recent changes.   GOALS ADDRESSED: Patient will: 1. Reduce symptoms of: anxiety, depression and stress 2. Increase knowledge and/or ability of: coping skills, healthy habits and stress reduction   3. Demonstrate ability to: Increase healthy adjustment to current life circumstances, Increase adequate support systems for patient/family and Increase motivation to adhere to plan of care  INTERVENTIONS: Interventions utilized: Motivational Interviewing, Supportive Counseling and Sleep Hygiene.   Standardized Assessments completed: assessed for SI, HI, and self-harm.   ASSESSMENT: Patient currently experiencing daily disturbances of pain, which impact her overall mental health. Patient appeared hopeless that her pain will improve. Patient is currently relying strongly on medication to reduce her pain. Patient did identify reading her bible, resting, and applying heat as healthy ways to cope with her pain. Patient could benefit from continuing to implement healthy coping skills when her pain is becoming overwhelming.    Patient may benefit from weekly to bi-weekly outpatient therapy.   PLAN: 1. Follow up with behavioral health clinician on : one to two weeks.  2. Behavioral recommendations: Begin practicing grounding techniques.   Dessie Coma, LPC, LCAS

## 2018-04-01 ENCOUNTER — Encounter: Payer: Self-pay | Admitting: Internal Medicine

## 2018-04-05 ENCOUNTER — Encounter: Payer: Self-pay | Admitting: Licensed Clinical Social Worker

## 2018-04-05 ENCOUNTER — Encounter: Payer: Self-pay | Admitting: Physical Therapy

## 2018-04-05 ENCOUNTER — Ambulatory Visit: Payer: Medicaid Other | Admitting: Licensed Clinical Social Worker

## 2018-04-05 ENCOUNTER — Ambulatory Visit: Payer: Medicaid Other | Admitting: Physical Therapy

## 2018-04-05 ENCOUNTER — Ambulatory Visit
Payer: Medicaid Other | Attending: Student in an Organized Health Care Education/Training Program | Admitting: Physical Therapy

## 2018-04-05 ENCOUNTER — Other Ambulatory Visit: Payer: Self-pay

## 2018-04-05 ENCOUNTER — Telehealth: Payer: Self-pay | Admitting: Licensed Clinical Social Worker

## 2018-04-05 DIAGNOSIS — M5441 Lumbago with sciatica, right side: Secondary | ICD-10-CM | POA: Diagnosis present

## 2018-04-05 DIAGNOSIS — M6281 Muscle weakness (generalized): Secondary | ICD-10-CM | POA: Insufficient documentation

## 2018-04-05 DIAGNOSIS — M5442 Lumbago with sciatica, left side: Secondary | ICD-10-CM | POA: Insufficient documentation

## 2018-04-05 DIAGNOSIS — R293 Abnormal posture: Secondary | ICD-10-CM | POA: Diagnosis present

## 2018-04-05 DIAGNOSIS — G8929 Other chronic pain: Secondary | ICD-10-CM | POA: Insufficient documentation
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
## 2018-04-05 NOTE — Telephone Encounter (Signed)
Patient was contacted due to not showing for her appointment today at [facility]. Patient did not answer, and no voicemail was available. A session was going to be offered over the phone due to the current COVID-19 restrictions. I will call the patient again in the next week so offer a phone session.

## 2018-04-11 ENCOUNTER — Encounter: Payer: Medicaid Other | Admitting: Physical Therapy

## 2018-04-20 ENCOUNTER — Encounter: Payer: Medicaid Other | Admitting: Physical Therapy

## 2018-04-26 ENCOUNTER — Encounter: Payer: Medicaid Other | Admitting: Physical Therapy

## 2018-05-03 ENCOUNTER — Encounter: Payer: Medicaid Other | Admitting: Physical Therapy

## 2018-05-10 ENCOUNTER — Ambulatory Visit: Payer: Medicaid Other | Attending: Internal Medicine | Admitting: Physical Therapy

## 2018-05-10 ENCOUNTER — Other Ambulatory Visit: Payer: Self-pay

## 2018-05-10 ENCOUNTER — Encounter: Payer: Self-pay | Admitting: Physical Therapy

## 2018-05-10 ENCOUNTER — Encounter: Payer: Medicaid Other | Admitting: Physical Therapy

## 2018-05-10 DIAGNOSIS — R293 Abnormal posture: Secondary | ICD-10-CM

## 2018-05-10 DIAGNOSIS — M5442 Lumbago with sciatica, left side: Secondary | ICD-10-CM | POA: Insufficient documentation

## 2018-05-10 DIAGNOSIS — M62838 Other muscle spasm: Secondary | ICD-10-CM | POA: Diagnosis present

## 2018-05-10 DIAGNOSIS — M5441 Lumbago with sciatica, right side: Secondary | ICD-10-CM | POA: Insufficient documentation

## 2018-05-10 DIAGNOSIS — G8929 Other chronic pain: Secondary | ICD-10-CM | POA: Diagnosis present

## 2018-05-10 DIAGNOSIS — M6281 Muscle weakness (generalized): Secondary | ICD-10-CM | POA: Diagnosis present
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
## 2018-05-10 NOTE — Patient Instructions (Signed)
Access Code: [email or web address]  URL: [email or web address]  Date: 05/10/2018  Prepared by: Earlie Counts   Exercises  Supine Pelvic Tilt - 10 reps - 3 sets - 1x daily - 7x weekly  Supine Figure 4 Piriformis Stretch - 3 reps - 1 sets - 30 sec hold - 1x daily - 7x weekly  Supine Hamstring Stretch - 3 reps - 1 sets - 30 sec hold - 1x daily - 7x weekly  Ball squeeze with Kegel - 10 reps - 1 sets - 3 sec hold - 3x daily - 7x weekly  Supine Lower Trunk Rotation - 10 reps - 1 sets - 10 sec hold - 1x daily - 7x weekly  Hooklying Single Knee to Chest - 2 reps - 1 sets - 30 sec hold - 1x daily - 7x weekly  [facility] [address] [place], [place] [address] Phone # [phone] Fax [phone]

## 2018-05-17 ENCOUNTER — Encounter: Payer: Medicaid Other | Admitting: Physical Therapy

## 2018-05-19 ENCOUNTER — Encounter: Payer: Self-pay | Admitting: Physical Therapy

## 2018-05-19 ENCOUNTER — Other Ambulatory Visit: Payer: Self-pay

## 2018-05-19 ENCOUNTER — Ambulatory Visit: Payer: Medicaid Other | Admitting: Physical Therapy

## 2018-05-19 DIAGNOSIS — M5442 Lumbago with sciatica, left side: Principal | ICD-10-CM

## 2018-05-19 DIAGNOSIS — M62838 Other muscle spasm: Secondary | ICD-10-CM

## 2018-05-19 DIAGNOSIS — M6281 Muscle weakness (generalized): Secondary | ICD-10-CM

## 2018-05-19 DIAGNOSIS — G8929 Other chronic pain: Secondary | ICD-10-CM

## 2018-05-19 DIAGNOSIS — M5441 Lumbago with sciatica, right side: Principal | ICD-10-CM

## 2018-05-19 DIAGNOSIS — R293 Abnormal posture: Secondary | ICD-10-CM

## 2018-05-19 NOTE — Patient Instructions (Signed)
Drink 8 glasses of water per day Go to the bathroom every 1 hours.  Certain foods and liquids will decrease the pH making the urine more acidic.  Urinary urgency increases when the urine has a low pH.  Most common irritants: alcohol, carbonated beverages and caffinated beverages.  Foods to avoid: apple juice, apples, ascorbic acid, canteloupes, chili, citrus fruits, coffee, cranberries, grapes, guava, peaches, pepper, pineapple, plums, strawberries, tea, tomatoes, and vinegar.  Drinking plenty of water may help to increase the pH and dilute out any of the effects of specific irritants.  Foods that are NOT irritating to the bladder include: Pears, papayas, sun-brewed teas, watermelons, non-citrus herbal teas, apricots, kava and low-acid instant drinks (Postum) [facility] [address] [place], [place] [address] Phone # [phone] Fax [phone]
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
## 2018-05-24 ENCOUNTER — Encounter: Payer: Medicaid Other | Admitting: Physical Therapy

## 2018-05-26 ENCOUNTER — Ambulatory Visit: Payer: Medicaid Other | Admitting: Physical Therapy

## 2018-05-31 ENCOUNTER — Ambulatory Visit: Payer: Medicaid Other | Attending: Internal Medicine | Admitting: Physical Therapy

## 2018-05-31 DIAGNOSIS — M5441 Lumbago with sciatica, right side: Secondary | ICD-10-CM | POA: Insufficient documentation

## 2018-05-31 DIAGNOSIS — M62838 Other muscle spasm: Secondary | ICD-10-CM | POA: Insufficient documentation

## 2018-05-31 DIAGNOSIS — M5442 Lumbago with sciatica, left side: Secondary | ICD-10-CM | POA: Insufficient documentation

## 2018-05-31 DIAGNOSIS — M6281 Muscle weakness (generalized): Secondary | ICD-10-CM | POA: Insufficient documentation

## 2018-05-31 DIAGNOSIS — G8929 Other chronic pain: Secondary | ICD-10-CM | POA: Insufficient documentation

## 2018-05-31 DIAGNOSIS — R293 Abnormal posture: Secondary | ICD-10-CM | POA: Insufficient documentation

## 2018-06-07 ENCOUNTER — Encounter: Payer: Self-pay | Admitting: Physical Therapy

## 2018-06-07 ENCOUNTER — Ambulatory Visit: Payer: Medicaid Other | Admitting: Physical Therapy

## 2018-06-07 ENCOUNTER — Other Ambulatory Visit: Payer: Self-pay

## 2018-06-07 DIAGNOSIS — M5441 Lumbago with sciatica, right side: Secondary | ICD-10-CM | POA: Diagnosis present

## 2018-06-07 DIAGNOSIS — G8929 Other chronic pain: Secondary | ICD-10-CM

## 2018-06-07 DIAGNOSIS — R293 Abnormal posture: Secondary | ICD-10-CM

## 2018-06-07 DIAGNOSIS — M6281 Muscle weakness (generalized): Secondary | ICD-10-CM

## 2018-06-07 DIAGNOSIS — M62838 Other muscle spasm: Secondary | ICD-10-CM

## 2018-06-07 DIAGNOSIS — M5442 Lumbago with sciatica, left side: Secondary | ICD-10-CM | POA: Diagnosis not present

## 2018-06-07 NOTE — Patient Instructions (Addendum)
Moisturizers . They are used in the vagina to hydrate the mucous membrane that make up the vaginal canal. . Designed to keep a more normal acid balance (ph) . Once placed in the vagina, it will last between two to three days.  . Use daily at bedtime in the vaginal canal . Use during the day to the vulva area to protect the skin from the pads.  . Ingredients to avoid is glycerin and fragrance, can increase chance of infection . Should not be used just before sex due to causing irritation . Most are gels administered either in a tampon-shaped applicator or as a vaginal suppository. They are non-hormonal.   Types of Moisturizers  . Coconut oil- can break down condoms   Things to avoid in the vaginal area . Do not use things to irritate the vulvar area . No lotions just specialized creams for the vulva area- Neogyn, V-magic, No soaps; can use Aveeno or Calendula cleanser if needed. Must be gentle . No deodorants . No douches . Good to sleep without underwear to let the vaginal area to air out . No scrubbing: spread the lips to let warm water rinse over labias and pat dry   Adduction: Hip - Knees Together With Pelvic Floor (Side-Lying)    Lie on left side, hips and knees slightly bent, towel roll between knees. Squeeze pelvic floor while pushing knees together. Hold for _5__ seconds. Rest for _5__ seconds. Repeat _10__ times. Do _2__ times a day.   Copyright  VHI. All rights reserved.  [facility] [address] [place], [place] [address] Phone # [phone] Fax [phone]
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
## 2018-06-14 ENCOUNTER — Ambulatory Visit: Payer: Medicaid Other | Admitting: Physical Therapy

## 2018-06-21 ENCOUNTER — Other Ambulatory Visit: Payer: Self-pay

## 2018-06-21 ENCOUNTER — Ambulatory Visit: Payer: Medicaid Other | Attending: Internal Medicine | Admitting: Physical Therapy

## 2018-06-21 ENCOUNTER — Encounter: Payer: Self-pay | Admitting: Physical Therapy

## 2018-06-21 DIAGNOSIS — M5442 Lumbago with sciatica, left side: Secondary | ICD-10-CM | POA: Insufficient documentation

## 2018-06-21 DIAGNOSIS — M62838 Other muscle spasm: Secondary | ICD-10-CM | POA: Diagnosis present

## 2018-06-21 DIAGNOSIS — R293 Abnormal posture: Secondary | ICD-10-CM | POA: Diagnosis present

## 2018-06-21 DIAGNOSIS — M5441 Lumbago with sciatica, right side: Secondary | ICD-10-CM | POA: Insufficient documentation

## 2018-06-21 DIAGNOSIS — G8929 Other chronic pain: Secondary | ICD-10-CM

## 2018-06-21 DIAGNOSIS — M6281 Muscle weakness (generalized): Secondary | ICD-10-CM | POA: Diagnosis present

## 2018-06-21 NOTE — Patient Instructions (Signed)
Access Code: [email or web address]  URL: [email or web address]  Date: 06/21/2018  Prepared by: Earlie Counts   Exercises Supine Pelvic Tilt - 10 reps - 3 sets - 1x daily - 7x weekly Supine Figure 4 Piriformis Stretch - 3 reps - 1 sets - 30 sec hold - 1x daily - 7x weekly Supine Hamstring Stretch - 3 reps - 1 sets - 30 sec hold - 1x daily - 7x weekly Ball squeeze with Kegel - 10 reps - 1 sets - 3 sec hold - 3x daily - 7x weekly Supine Lower Trunk Rotation - 10 reps - 1 sets - 10 sec hold - 1x daily - 7x weekly Hooklying Single Knee to Chest - 2 reps - 1 sets - 30 sec hold - 1x daily - 7x weekly Hooklying Single Leg Bent Knee Fallouts with Resistance - 20 reps - 1 sets - 1x daily - 7x weekly Hooklying Pelvic Floor Contractions - 10 reps - 1 sets - 5 sec hold                            - 1x daily - 7x weekly  [facility] [address], [place], [place] [address] Phone # [phone] Fax [phone]
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
## 2018-06-28 ENCOUNTER — Encounter: Payer: Self-pay | Admitting: Physical Therapy

## 2018-06-28 ENCOUNTER — Ambulatory Visit: Payer: Medicaid Other | Admitting: Physical Therapy

## 2018-06-28 ENCOUNTER — Other Ambulatory Visit: Payer: Self-pay

## 2018-06-28 DIAGNOSIS — M5442 Lumbago with sciatica, left side: Secondary | ICD-10-CM | POA: Diagnosis not present

## 2018-06-28 DIAGNOSIS — G8929 Other chronic pain: Secondary | ICD-10-CM

## 2018-06-28 DIAGNOSIS — R293 Abnormal posture: Secondary | ICD-10-CM

## 2018-06-28 DIAGNOSIS — M6281 Muscle weakness (generalized): Secondary | ICD-10-CM

## 2018-06-28 DIAGNOSIS — M62838 Other muscle spasm: Secondary | ICD-10-CM

## 2018-06-28 NOTE — Patient Instructions (Addendum)
[facility]  [address] [place], [place] [address]  [phone]  Want to see a doctor about urine leakage. Have tried physical therapy for 7 visits.  [place] [address] [place], [place] [address] Phone # [phone] Fax [phone]
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
## 2018-06-30 ENCOUNTER — Ambulatory Visit: Payer: Medicaid Other

## 2018-07-04 ENCOUNTER — Ambulatory Visit: Payer: Medicaid Other

## 2018-07-05 ENCOUNTER — Other Ambulatory Visit: Payer: Self-pay

## 2018-07-05 ENCOUNTER — Ambulatory Visit: Payer: Medicaid Other | Admitting: Physical Therapy

## 2018-07-05 ENCOUNTER — Encounter: Payer: Self-pay | Admitting: Physical Therapy

## 2018-07-05 DIAGNOSIS — M5442 Lumbago with sciatica, left side: Secondary | ICD-10-CM | POA: Diagnosis not present

## 2018-07-05 DIAGNOSIS — R293 Abnormal posture: Secondary | ICD-10-CM

## 2018-07-05 DIAGNOSIS — G8929 Other chronic pain: Secondary | ICD-10-CM

## 2018-07-05 DIAGNOSIS — M62838 Other muscle spasm: Secondary | ICD-10-CM

## 2018-07-05 DIAGNOSIS — M6281 Muscle weakness (generalized): Secondary | ICD-10-CM
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
## 2018-07-12 ENCOUNTER — Ambulatory Visit: Payer: Medicaid Other | Admitting: Physical Therapy

## 2018-07-13 ENCOUNTER — Ambulatory Visit: Payer: Medicaid Other | Admitting: Physical Therapy

## 2018-07-13 ENCOUNTER — Other Ambulatory Visit: Payer: Self-pay

## 2018-07-13 ENCOUNTER — Encounter: Payer: Self-pay | Admitting: Physical Therapy

## 2018-07-13 DIAGNOSIS — M62838 Other muscle spasm: Secondary | ICD-10-CM

## 2018-07-13 DIAGNOSIS — M5442 Lumbago with sciatica, left side: Secondary | ICD-10-CM | POA: Diagnosis not present

## 2018-07-13 DIAGNOSIS — M6281 Muscle weakness (generalized): Secondary | ICD-10-CM

## 2018-07-13 DIAGNOSIS — R293 Abnormal posture: Secondary | ICD-10-CM

## 2018-07-13 DIAGNOSIS — G8929 Other chronic pain: Secondary | ICD-10-CM
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
## 2018-07-15 ENCOUNTER — Other Ambulatory Visit: Payer: Self-pay | Admitting: Nurse Practitioner

## 2018-07-15 DIAGNOSIS — Z1231 Encounter for screening mammogram for malignant neoplasm of breast: Secondary | ICD-10-CM

## 2018-07-19 ENCOUNTER — Encounter: Payer: Self-pay | Admitting: Physical Therapy

## 2018-07-19 ENCOUNTER — Ambulatory Visit: Payer: Medicaid Other | Admitting: Physical Therapy

## 2018-07-19 ENCOUNTER — Other Ambulatory Visit: Payer: Self-pay

## 2018-07-19 DIAGNOSIS — G8929 Other chronic pain: Secondary | ICD-10-CM

## 2018-07-19 DIAGNOSIS — R293 Abnormal posture: Secondary | ICD-10-CM

## 2018-07-19 DIAGNOSIS — M5442 Lumbago with sciatica, left side: Secondary | ICD-10-CM | POA: Diagnosis not present

## 2018-07-19 DIAGNOSIS — M6281 Muscle weakness (generalized): Secondary | ICD-10-CM

## 2018-07-19 DIAGNOSIS — M62838 Other muscle spasm: Secondary | ICD-10-CM
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
## 2018-07-26 ENCOUNTER — Ambulatory Visit: Payer: Medicaid Other | Attending: Internal Medicine | Admitting: Physical Therapy

## 2018-07-26 ENCOUNTER — Other Ambulatory Visit: Payer: Self-pay

## 2018-07-26 ENCOUNTER — Encounter: Payer: Self-pay | Admitting: Physical Therapy

## 2018-07-26 DIAGNOSIS — M5442 Lumbago with sciatica, left side: Secondary | ICD-10-CM | POA: Diagnosis not present

## 2018-07-26 DIAGNOSIS — G8929 Other chronic pain: Secondary | ICD-10-CM | POA: Diagnosis present

## 2018-07-26 DIAGNOSIS — R293 Abnormal posture: Secondary | ICD-10-CM | POA: Diagnosis present

## 2018-07-26 DIAGNOSIS — M6281 Muscle weakness (generalized): Secondary | ICD-10-CM | POA: Insufficient documentation

## 2018-07-26 DIAGNOSIS — M5441 Lumbago with sciatica, right side: Secondary | ICD-10-CM | POA: Diagnosis present

## 2018-07-26 DIAGNOSIS — M62838 Other muscle spasm: Secondary | ICD-10-CM | POA: Diagnosis present
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
## 2018-08-02 ENCOUNTER — Other Ambulatory Visit: Payer: Self-pay

## 2018-08-02 ENCOUNTER — Ambulatory Visit: Payer: Medicaid Other | Admitting: Physical Therapy

## 2018-08-02 ENCOUNTER — Encounter: Payer: Self-pay | Admitting: Physical Therapy

## 2018-08-02 DIAGNOSIS — M5442 Lumbago with sciatica, left side: Secondary | ICD-10-CM | POA: Diagnosis not present

## 2018-08-02 DIAGNOSIS — R293 Abnormal posture: Secondary | ICD-10-CM

## 2018-08-02 DIAGNOSIS — M6281 Muscle weakness (generalized): Secondary | ICD-10-CM

## 2018-08-02 DIAGNOSIS — M62838 Other muscle spasm: Secondary | ICD-10-CM

## 2018-08-02 DIAGNOSIS — G8929 Other chronic pain: Secondary | ICD-10-CM
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
## 2018-08-02 NOTE — Patient Instructions (Signed)
Access Code: [email or web address]  URL: [email or web address]  Date: 08/02/2018  Prepared by: Earlie Counts   Exercises Supine Pelvic Tilt - 10 reps - 3 sets - 1x daily - 7x weekly Supine Figure 4 Piriformis Stretch - 3 reps - 1 sets - 30 sec hold - 1x daily - 7x weekly Supine Hamstring Stretch - 3 reps - 1 sets - 30 sec hold - 1x daily - 7x weekly Supine Lower Trunk Rotation - 10 reps - 1 sets - 10 sec hold - 1x daily - 7x weekly Hooklying Single Knee to Chest - 2 reps - 1 sets - 30 sec hold - 1x daily - 7x weekly Hooklying Single Leg Bent Knee Fallouts with Resistance - 20 reps - 1 sets - 1x daily - 7x weekly Hooklying Pelvic Floor Contractions - 10 reps - 1 sets - 5 sec hold                            - 1x daily - 7x weekly Seated Long Arc Quad with Hip Adduction - 10 reps - 3 sets - 1x daily - 7x weekly Seated Isometric Hip Adduction with Ball - 10 reps - 1 sets - hold squeeze hold - 1x daily - 7x weekly Seated March - 10 reps - 2 sets - 1x daily - 7x weekly [facility] [address], [place], [place] [address] Phone # [phone] Fax [phone]

## 2018-09-16 ENCOUNTER — Ambulatory Visit: Payer: Medicaid Other

## 2018-09-22 ENCOUNTER — Other Ambulatory Visit: Payer: Self-pay | Admitting: Internal Medicine

## 2018-09-22 DIAGNOSIS — G8929 Other chronic pain: Secondary | ICD-10-CM

## 2018-09-22 DIAGNOSIS — M5442 Lumbago with sciatica, left side: Secondary | ICD-10-CM

## 2018-09-22 NOTE — Progress Notes (Signed)
Per Lela: "PATIENT CALLED REQUESTING NEW ORTHO REFERRAL TO DR VOYTEX. SHE TRIED TO GET AN APPOINTMENT WITH THEM, WAS TOLD SHE NEEDED A NEW REFERRAL , PREVIOUS REFERRAL EXPIRED. CHRONIC BACK PAIN/ DR Dia Sitter. "   Have placed referral

## 2018-10-03 ENCOUNTER — Ambulatory Visit: Payer: Medicaid Other

## 2018-10-25 ENCOUNTER — Other Ambulatory Visit: Payer: Self-pay

## 2018-10-25 ENCOUNTER — Encounter: Payer: Self-pay | Admitting: Physical Therapy

## 2018-10-25 ENCOUNTER — Ambulatory Visit: Payer: Medicaid Other | Attending: Internal Medicine | Admitting: Physical Therapy

## 2018-10-25 DIAGNOSIS — M6281 Muscle weakness (generalized): Secondary | ICD-10-CM | POA: Insufficient documentation

## 2018-10-25 DIAGNOSIS — M545 Low back pain: Secondary | ICD-10-CM | POA: Insufficient documentation

## 2018-10-25 DIAGNOSIS — G8929 Other chronic pain: Secondary | ICD-10-CM | POA: Insufficient documentation

## 2018-10-25 DIAGNOSIS — M5441 Lumbago with sciatica, right side: Secondary | ICD-10-CM | POA: Insufficient documentation

## 2018-10-25 DIAGNOSIS — M5442 Lumbago with sciatica, left side: Secondary | ICD-10-CM | POA: Diagnosis present

## 2018-10-25 DIAGNOSIS — R293 Abnormal posture: Secondary | ICD-10-CM | POA: Diagnosis present

## 2018-10-25 DIAGNOSIS — M542 Cervicalgia: Secondary | ICD-10-CM | POA: Insufficient documentation

## 2018-10-25 DIAGNOSIS — M62838 Other muscle spasm: Secondary | ICD-10-CM
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
## 2018-10-28 ENCOUNTER — Other Ambulatory Visit: Payer: Self-pay

## 2018-10-28 ENCOUNTER — Ambulatory Visit
Admission: RE | Admit: 2018-10-28 | Discharge: 2018-10-28 | Disposition: A | Discharge status: Home / Self Care | Payer: Medicaid Other | Source: Ambulatory Visit | Attending: Nurse Practitioner | Admitting: Nurse Practitioner

## 2018-10-28 DIAGNOSIS — Z1231 Encounter for screening mammogram for malignant neoplasm of breast: Secondary | ICD-10-CM

## 2018-11-02 ENCOUNTER — Ambulatory Visit: Payer: Medicaid Other

## 2018-11-03 ENCOUNTER — Other Ambulatory Visit: Payer: Self-pay

## 2018-11-03 ENCOUNTER — Ambulatory Visit: Payer: Medicaid Other

## 2018-11-03 DIAGNOSIS — M62838 Other muscle spasm: Secondary | ICD-10-CM

## 2018-11-03 DIAGNOSIS — M542 Cervicalgia: Secondary | ICD-10-CM

## 2018-11-03 DIAGNOSIS — M5441 Lumbago with sciatica, right side: Secondary | ICD-10-CM

## 2018-11-03 DIAGNOSIS — M6281 Muscle weakness (generalized): Secondary | ICD-10-CM

## 2018-11-03 DIAGNOSIS — R293 Abnormal posture: Secondary | ICD-10-CM

## 2018-11-03 DIAGNOSIS — G8929 Other chronic pain: Secondary | ICD-10-CM

## 2018-11-03 NOTE — Patient Instructions (Signed)
Access Code: [email or web address]  URL: [email or web address]  Date: 11/03/2018  Prepared by: Tomma Rakers   Exercises Supine Transversus Abdominis Bracing - Hands on Stomach - 10 reps - 1 sets - 3x daily - 7x weekly Supine Gluteal Sets - 10 reps - 1 sets - you can do this while you are lying on your back with your knees bent hold - 3x daily - 7x weekly Supine Chin Tuck - 10 reps - 1 sets - 1x daily - 7x weekly
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
## 2018-11-07 ENCOUNTER — Ambulatory Visit: Payer: Medicaid Other

## 2018-11-09 ENCOUNTER — Ambulatory Visit: Payer: Medicaid Other | Admitting: Physical Therapy

## 2018-11-09 ENCOUNTER — Other Ambulatory Visit: Payer: Self-pay

## 2018-11-09 ENCOUNTER — Encounter: Payer: Self-pay | Admitting: Physical Therapy

## 2018-11-09 DIAGNOSIS — G8929 Other chronic pain: Secondary | ICD-10-CM

## 2018-11-09 DIAGNOSIS — M542 Cervicalgia: Secondary | ICD-10-CM

## 2018-11-09 DIAGNOSIS — M6281 Muscle weakness (generalized): Secondary | ICD-10-CM

## 2018-11-09 DIAGNOSIS — M545 Low back pain: Secondary | ICD-10-CM

## 2018-11-09 NOTE — Therapy (Signed)
[place], [place], [address] Phone: [phone]   Fax:  [phone]  Physical Therapy Treatment  Patient Details  Name: Kristin Joseph MRN: [id number] Date of Birth: May 01, 1970 Referring Provider (PT): Carylon Perches   Encounter Date: 11/09/2018  PT End of Session - 11/09/18 1153    Visit Number  3    Number of Visits  4    Date for PT Re-Evaluation  12/01/18    Authorization Type  Medicaid     Authorization Time Period  end date is 12/01/18    Authorization - Visit Number  2    Authorization - Number of Visits  3    PT Start Time  1105    PT Stop Time  1155    PT Time Calculation (min)  50 min    Activity Tolerance  Patient limited by pain    Behavior During Therapy  [facility] for tasks assessed/performed       Past Medical History:  Diagnosis Date  . 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 Back pain   . CALLUSES, FEET, BILATERAL 08/08/2007   Qualifier: Diagnosis of  By: Hassell Done FNP, Tori Milks    . Depression   . FATIGUE 04/18/2008   Qualifier: Diagnosis of  By: Hassell Done FNP, Tori Milks    . Fibromyalgia 10/25/2012  . Gastritis 06/28/2012   Mild  . HEADACHE 08/22/2007   Qualifier: Diagnosis of  By: Hassell Done FNP, Tori Milks    . Hyperglycemia 03/17/2017  . Lumbago 07/18/2007   Qualifier: Diagnosis of  By: Hassell Done FNP, Tori Milks    . Obesity   . Pelvic pain   . SKIN RASH 09/21/2007   Qualifier: Diagnosis of  By: Hassell Done FNP, Tori Milks    . Stress headaches   . Vitamin D deficiency     Past Surgical History:  Procedure Laterality Date  . APPENDECTOMY    . BREAST BIOPSY Right   . FOOT SURGERY Bilateral    Plantar fasciitis  . THYROIDECTOMY, PARTIAL     pt denies    There were no vitals filed for this visit.  Subjective Assessment - 11/09/18 1150    Subjective  Patient comes in appearing to be in pain, she has to continue to move and asks to lie down,  reports hurting "all the time"    Currently in Pain?  Yes    Pain Score  9     Pain Location  Back    Pain Orientation  Lower    Aggravating Factors   everything causes pain    Pain Relieving Factors  rest, naps help, but reports immediate pain when she gets up                       [facility] Adult PT Treatment/Exercise - 11/09/18 0001      Lumbar Exercises: Supine   Ab Set  10 reps;5 seconds    AB Set Limitations  Heavy VC pt has poor proprioception in the core with significant extra time spent on education for actual transverse abdominus activation and decreasing compensation with the hamstrings    Pelvic Tilt  20 reps;5 seconds    Pelvic Tilt Limitations  a lot of cues verbal and tactile, patient with difficulty staying on task    Bridge  10 reps    Bridge with [employer]  10 reps    Other Supine  Lumbar Exercises  feet on ball K2C, trunk rotation, small bridges and isometric abs      Modalities   Modalities  Moist Heat      Moist Heat Therapy   Number Minutes Moist Heat  12 Minutes    Moist Heat Location  Cervical;Lumbar Spine      Manual Therapy   Manual Therapy  Soft tissue mobilization    Soft tissue mobilization  to the bilatera lumbar paraspinals               PT Short Term Goals - 08/02/18 1656      PT SHORT TERM GOAL #1   Title  She will be independent with intial HEP    Time  4    Period  Weeks    Status  Achieved      PT SHORT TERM GOAL #2   Title  Pt will report she is able to stand for at least 15 minutes without having to lie down    Baseline  10 min and will have increased pain    Time  4    Period  Weeks    Status  Not Met    Target Date  05/03/18        PT Long Term Goals - 11/09/18 1156      PT LONG TERM GOAL #1   Title  pt will be ind with advanced HEP    Status  On-going      PT LONG TERM GOAL #2   Title  Pt will be able to stand for 30 minutes so she can take a shower without having to lie down right afterwards due  to improved pain and strength    Status  On-going      PT LONG TERM GOAL #3   Title  increase cervical ROM 25%    Status  On-going            Plan - 11/09/18 1154    Clinical Impression Statement  Patient has a lot of issues with back and neck pain, she has a daughter that is special needs at home and cares for her mom and another daughter as well.  She seems to be in increased pain and stress today.  I tried some manual pelvic traction with a sheet behind her knee and this "may help some" but she continue to need to move due to pain, she does have spasms and tightness in the lumbar area, has to have a tremendous amount of cueing to stay on task and to do exercises correctly    PT Next Visit Plan  Continue with core activity progression assess transverse abdominus activation and chin tuck proressing core/postural activities as tolerated and once adequate activation is acheived.    Consulted and Agree with Plan of Care  Patient       Patient will benefit from skilled therapeutic intervention in order to improve the following deficits and impairments:  Abnormal gait, Pain, Decreased strength, Postural dysfunction, Decreased mobility, Cardiopulmonary status limiting activity, Decreased activity tolerance, Decreased endurance, Decreased range of motion, Difficulty walking, Impaired flexibility, Increased muscle spasms  Visit Diagnosis: Cervicalgia  Chronic bilateral low back pain without sciatica  Muscle weakness (generalized)     Problem List Patient Active Problem List   Diagnosis Date Noted  . Healthcare maintenance 10/08/2017  . Urge incontinence of urine 05/22/2017  . Fibromyalgia 10/25/2012  . LLQ abdominal pain 09/08/2012  . Oligomenorrhea 05/20/2011  . Vitamin D deficiency 02/15/2009  .  Calcaneal spur of foot, right 08/22/2007  . Lower back pain 07/18/2007    [name], PT 11/09/2018, 11:56 AM  [place], [place], [address] Phone: [phone]   Fax:  [phone]  Name: Orena Cavazos MRN: [id number] Date of Birth: 06-26-1970

## 2018-11-14 ENCOUNTER — Ambulatory Visit: Payer: Medicaid Other | Admitting: Physical Therapy

## 2018-11-15 ENCOUNTER — Other Ambulatory Visit: Payer: Self-pay

## 2018-11-15 ENCOUNTER — Encounter: Payer: Self-pay | Admitting: Physical Therapy

## 2018-11-15 ENCOUNTER — Ambulatory Visit: Payer: Medicaid Other | Admitting: Physical Therapy

## 2018-11-15 DIAGNOSIS — M542 Cervicalgia: Secondary | ICD-10-CM | POA: Diagnosis not present

## 2018-11-15 DIAGNOSIS — M545 Low back pain, unspecified: Secondary | ICD-10-CM

## 2018-11-15 DIAGNOSIS — G8929 Other chronic pain: Secondary | ICD-10-CM

## 2018-11-15 DIAGNOSIS — M6281 Muscle weakness (generalized): Secondary | ICD-10-CM

## 2018-11-15 NOTE — Therapy (Signed)
[place], [place], [address] Phone: [phone]   Fax:  [phone]  Physical Therapy Treatment  Patient Details  Name: Kristin Joseph MRN: [id number] Date of Birth: Apr 28, 1970 Referring Provider (PT): Carylon Perches   Encounter Date: 11/15/2018  PT End of Session - 11/15/18 1120    Visit Number  4    Number of Visits  4    Date for PT Re-Evaluation  12/01/18    Authorization Type  Medicaid     Authorization Time Period  end date is 12/01/18    PT Start Time  0848   heat   PT Stop Time  0933    PT Time Calculation (min)  45 min    Activity Tolerance  Patient limited by pain    Behavior During Therapy  [facility] for tasks assessed/performed       Past Medical History:  Diagnosis Date  . 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 Back pain   . CALLUSES, FEET, BILATERAL 08/08/2007   Qualifier: Diagnosis of  By: Hassell Done FNP, Tori Milks    . Depression   . FATIGUE 04/18/2008   Qualifier: Diagnosis of  By: Hassell Done FNP, Tori Milks    . Fibromyalgia 10/25/2012  . Gastritis 06/28/2012   Mild  . HEADACHE 08/22/2007   Qualifier: Diagnosis of  By: Hassell Done FNP, Tori Milks    . Hyperglycemia 03/17/2017  . Lumbago 07/18/2007   Qualifier: Diagnosis of  By: Hassell Done FNP, Tori Milks    . Obesity   . Pelvic pain   . SKIN RASH 09/21/2007   Qualifier: Diagnosis of  By: Hassell Done FNP, Tori Milks    . Stress headaches   . Vitamin D deficiency     Past Surgical History:  Procedure Laterality Date  . APPENDECTOMY    . BREAST BIOPSY Right   . FOOT SURGERY Bilateral    Plantar fasciitis  . THYROIDECTOMY, PARTIAL     pt denies    There were no vitals filed for this visit.  Subjective Assessment - 11/15/18 0909    Subjective  Patient reports a lot of stress in her life, she has a handicapped daughter, she is having incontinence issues, she also is caring for her mom.  She reports that she  has not been able to walk for exercise due to pain and the incontinence issues.  She reports that she is talking to people to get help with her daughter.    Currently in Pain?  Yes    Pain Score  9     Pain Location  Back    Pain Orientation  Lower    Aggravating Factors   reports all ADL's cause pain and that she has to take many breaks due to pain                       [facility] Adult PT Treatment/Exercise - 11/15/18 0001      Ambulation/Gait   Gait Comments  tried some ambulation with patient today to try to increase her ability to walk and function in the world, after about 75 feet she starts shuffling and is stooped over      Lumbar Exercises: Stretches   Active Hamstring Stretch  Right;Left;2 reps;30 seconds    Lower Trunk Rotation  5 reps;10 seconds    Piriformis Stretch  Right;Left;2 reps;30 seconds      Lumbar Exercises: Supine  Bridge  10 reps    Bridge with [employer]  10 reps    Other Supine Lumbar Exercises  feet on ball K2C, trunk rotation, small bridges and isometric abs      Modalities   Modalities  Moist Heat      Moist Heat Therapy   Number Minutes Moist Heat  12 Minutes    Moist Heat Location  Cervical;Lumbar Spine      Manual Therapy   Manual Therapy  Soft tissue mobilization    Soft tissue mobilization  to the bilatera lumbar paraspinals with her in sidelying               PT Short Term Goals - 08/02/18 1656      PT SHORT TERM GOAL #1   Title  She will be independent with intial HEP    Time  4    Period  Weeks    Status  Achieved      PT SHORT TERM GOAL #2   Title  Pt will report she is able to stand for at least 15 minutes without having to lie down    Baseline  10 min and will have increased pain    Time  4    Period  Weeks    Status  Not Met    Target Date  05/03/18        PT Long Term Goals - 11/15/18 1123      PT LONG TERM GOAL #1   Title  pt will be ind with advanced HEP    Status  On-going      PT LONG  TERM GOAL #2   Title  Pt will be able to stand for 30 minutes so she can take a shower without having to lie down right afterwards due to improved pain and strength    Status  On-going      PT LONG TERM GOAL #3   Title  increase cervical ROM 25%    Status  On-going      PT LONG TERM GOAL #4   Title  increase lumbar ROM 50%    Status  On-going      PT LONG TERM GOAL #5   Title  Pt will report she is able to walk at least 10 minutes for return to exercise as par tof healthy lifestyle    Status  On-going            Plan - 11/15/18 1120    Clinical Impression Statement  Spoke with patient at length about her need to stay active and functioning, she reports that she cannot walk and has to lie down often due to pain, she asks questions about care for her daughter and talks about nursing home placement and is very tearful with this very hard decision, she reports that she cannot do this to the daughter but she is really struggling to care for the daughter and dare for her self, I talked with her about how young she is and that she needs to stay active for a quality of life issue    PT Next Visit Plan  will need to ask for more visits but the time frame is not over until 12/01/18    Consulted and Agree with Plan of Care  Patient       Patient will benefit from skilled therapeutic intervention in order to improve the following deficits and impairments:     Visit Diagnosis: Cervicalgia  Chronic bilateral low  back pain without sciatica  Muscle weakness (generalized)     Problem List Patient Active Problem List   Diagnosis Date Noted  . Healthcare maintenance 10/08/2017  . Urge incontinence of urine 05/22/2017  . Fibromyalgia 10/25/2012  . LLQ abdominal pain 09/08/2012  . Oligomenorrhea 05/20/2011  . Vitamin D deficiency 02/15/2009  . Calcaneal spur of foot, right 08/22/2007  . Lower back pain 07/18/2007    [name], PT 11/15/2018, 11:23 AM  [place] [address] [place], [place], [address] Phone: [phone]   Fax:  [phone]  Name: Kristin Joseph MRN: [id number] Date of Birth: [birth year]

## 2018-11-16 ENCOUNTER — Ambulatory Visit: Payer: Medicaid Other

## 2018-11-25 ENCOUNTER — Telehealth: Payer: Self-pay | Admitting: Physical Therapy

## 2018-11-25 NOTE — Telephone Encounter (Signed)
Returned patients phone call.  Earlie Counts, PT @11 /06/2018@ 8:35 AM

## 2018-12-20 ENCOUNTER — Ambulatory Visit: Payer: Medicaid Other | Admitting: Physical Therapy

## 2018-12-23 ENCOUNTER — Encounter: Payer: Self-pay | Admitting: Physical Therapy

## 2018-12-23 ENCOUNTER — Ambulatory Visit: Payer: Medicaid Other | Attending: Internal Medicine | Admitting: Physical Therapy

## 2018-12-23 ENCOUNTER — Other Ambulatory Visit: Payer: Self-pay

## 2018-12-23 DIAGNOSIS — M542 Cervicalgia: Secondary | ICD-10-CM | POA: Insufficient documentation

## 2018-12-23 DIAGNOSIS — G8929 Other chronic pain: Secondary | ICD-10-CM | POA: Insufficient documentation

## 2018-12-23 DIAGNOSIS — M6281 Muscle weakness (generalized): Secondary | ICD-10-CM | POA: Insufficient documentation

## 2018-12-23 DIAGNOSIS — M545 Low back pain: Secondary | ICD-10-CM | POA: Diagnosis present

## 2018-12-23 NOTE — Therapy (Signed)
[place], [place], [address] Phone: [phone]   Fax:  [phone]  Physical Therapy Treatment  Patient Details  Name: Kristin Joseph MRN: [id number] Date of Birth: Oct 31, 1970 Referring Provider (PT): Carylon Perches   Encounter Date: 12/23/2018  PT End of Session - 12/23/18 0836    Visit Number  5    Date for PT Re-Evaluation  01/15/19    Authorization Type  Medicaid     PT Start Time  0809    PT Stop Time  0905    PT Time Calculation (min)  56 min    Activity Tolerance  Patient limited by pain    Behavior During Therapy  [facility] for tasks assessed/performed       Past Medical History:  Diagnosis Date  . 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 Back pain   . CALLUSES, FEET, BILATERAL 08/08/2007   Qualifier: Diagnosis of  By: Hassell Done FNP, Tori Milks    . Depression   . FATIGUE 04/18/2008   Qualifier: Diagnosis of  By: Hassell Done FNP, Tori Milks    . Fibromyalgia 10/25/2012  . Gastritis 06/28/2012   Mild  . HEADACHE 08/22/2007   Qualifier: Diagnosis of  By: Hassell Done FNP, Tori Milks    . Hyperglycemia 03/17/2017  . Lumbago 07/18/2007   Qualifier: Diagnosis of  By: Hassell Done FNP, Tori Milks    . Obesity   . Pelvic pain   . SKIN RASH 09/21/2007   Qualifier: Diagnosis of  By: Hassell Done FNP, Tori Milks    . Stress headaches   . Vitamin D deficiency     Past Surgical History:  Procedure Laterality Date  . APPENDECTOMY    . BREAST BIOPSY Right   . FOOT SURGERY Bilateral    Plantar fasciitis  . THYROIDECTOMY, PARTIAL     pt denies    There were no vitals filed for this visit.  Subjective Assessment - 12/23/18 0815    Subjective  Patient reports that she has continued to have some pain since not seeing [place], she reports that she gets a good 4-5 days of less pain after our treatment.    Currently in Pain?  Yes    Pain Score  8     Pain Location  Back   neck   Aggravating  Factors   activity    Pain Relieving Factors  reports treatment helps for 4-5 days                       [facility] Adult PT Treatment/Exercise - 12/23/18 0001      Neck Exercises: Theraband   Shoulder Extension  20 reps;Red    Rows  20 reps;Red      Lumbar Exercises: Stretches   Passive Hamstring Stretch  Left;Right;4 reps;20 seconds    Lower Trunk Rotation  5 reps;10 seconds    Piriformis Stretch  Right;Left;30 seconds;4 reps      Lumbar Exercises: Aerobic   Nustep  level 4 x 6 minutes some cues to keep going      Lumbar Exercises: Supine   Bridge  10 reps    Bridge with [employer]  10 reps      Modalities   Modalities  Moist Heat      Moist Heat Therapy   Number Minutes Moist Heat  12 Minutes    Moist Heat Location  Cervical;Lumbar Spine  Manual Therapy   Manual Therapy  Soft tissue mobilization    Soft tissue mobilization  to the bilatera lumbar paraspinals with her in sidelying               PT Short Term Goals - 08/02/18 1656      PT SHORT TERM GOAL #1   Title  She will be independent with intial HEP    Time  4    Period  Weeks    Status  Achieved      PT SHORT TERM GOAL #2   Title  Pt will report she is able to stand for at least 15 minutes without having to lie down    Baseline  10 min and will have increased pain    Time  4    Period  Weeks    Status  Not Met    Target Date  05/03/18        PT Long Term Goals - 12/23/18 0945      PT LONG TERM GOAL #1   Title  pt will be ind with advanced HEP    Status  On-going      PT LONG TERM GOAL #2   Title  Pt will be able to stand for 30 minutes so she can take a shower without having to lie down right afterwards due to improved pain and strength    Status  On-going      PT LONG TERM GOAL #3   Title  increase cervical ROM 25%    Status  On-going            Plan - 12/23/18 0943    Clinical Impression Statement  Patient continues to be limited by pain, some things will  increase the neck and the back pain, she also c/o some incontinence with certain activities.  She did seem to try more today with the exercises and with encouragement did not stop    PT Next Visit Plan  Patient will need to try to progress with exercises    Consulted and Agree with Plan of Care  Patient       Patient will benefit from skilled therapeutic intervention in order to improve the following deficits and impairments:  Abnormal gait, Pain, Decreased strength, Postural dysfunction, Decreased mobility, Cardiopulmonary status limiting activity, Decreased activity tolerance, Decreased endurance, Decreased range of motion, Difficulty walking, Impaired flexibility, Increased muscle spasms  Visit Diagnosis: Cervicalgia  Chronic bilateral low back pain without sciatica  Muscle weakness (generalized)     Problem List Patient Active Problem List   Diagnosis Date Noted  . Healthcare maintenance 10/08/2017  . Urge incontinence of urine 05/22/2017  . Fibromyalgia 10/25/2012  . LLQ abdominal pain 09/08/2012  . Oligomenorrhea 05/20/2011  . Vitamin D deficiency 02/15/2009  . Calcaneal spur of foot, right 08/22/2007  . Lower back pain 07/18/2007    [name], PT 12/23/2018, 9:45 AM  [facility] [address] [place], [place], [address] Phone: [phone]   Fax:  [phone]  Name: Tajuana Kniskern MRN: [id number] Date of Birth: Nov 13, 1970

## 2018-12-27 ENCOUNTER — Encounter: Payer: Self-pay | Admitting: Physical Therapy

## 2018-12-27 ENCOUNTER — Ambulatory Visit: Payer: Medicaid Other | Admitting: Physical Therapy

## 2018-12-27 ENCOUNTER — Other Ambulatory Visit: Payer: Self-pay

## 2018-12-27 DIAGNOSIS — M545 Low back pain: Secondary | ICD-10-CM

## 2018-12-27 DIAGNOSIS — M6281 Muscle weakness (generalized): Secondary | ICD-10-CM

## 2018-12-27 DIAGNOSIS — G8929 Other chronic pain: Secondary | ICD-10-CM

## 2018-12-27 DIAGNOSIS — M542 Cervicalgia: Secondary | ICD-10-CM | POA: Diagnosis not present
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
## 2018-12-30 ENCOUNTER — Ambulatory Visit: Payer: Medicaid Other | Admitting: Physical Therapy

## 2018-12-30 ENCOUNTER — Encounter: Payer: Self-pay | Admitting: Physical Therapy

## 2018-12-30 ENCOUNTER — Other Ambulatory Visit: Payer: Self-pay

## 2018-12-30 DIAGNOSIS — G8929 Other chronic pain: Secondary | ICD-10-CM

## 2018-12-30 DIAGNOSIS — M542 Cervicalgia: Secondary | ICD-10-CM | POA: Diagnosis not present

## 2018-12-30 DIAGNOSIS — M6281 Muscle weakness (generalized): Secondary | ICD-10-CM

## 2018-12-30 NOTE — Therapy (Signed)
[place], [place], [address] Phone: [phone]   Fax:  [phone]  Physical Therapy Treatment  Patient Details  Name: Kristin Joseph MRN: [id number] Date of Birth: April 13, 1970 Referring Provider (PT): Carylon Perches   Encounter Date: 12/30/2018  PT End of Session - 12/30/18 0912    Visit Number  7    Date for PT Re-Evaluation  01/15/19    Authorization Type  Medicaid     PT Start Time  0900   patient 15 minutes late   PT Stop Time  0935    PT Time Calculation (min)  35 min    Activity Tolerance  Patient limited by pain    Behavior During Therapy  [facility] for tasks assessed/performed       Past Medical History:  Diagnosis Date  . 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 Back pain   . CALLUSES, FEET, BILATERAL 08/08/2007   Qualifier: Diagnosis of  By: Hassell Done FNP, Tori Milks    . Depression   . FATIGUE 04/18/2008   Qualifier: Diagnosis of  By: Hassell Done FNP, Tori Milks    . Fibromyalgia 10/25/2012  . Gastritis 06/28/2012   Mild  . HEADACHE 08/22/2007   Qualifier: Diagnosis of  By: Hassell Done FNP, Tori Milks    . Hyperglycemia 03/17/2017  . Lumbago 07/18/2007   Qualifier: Diagnosis of  By: Hassell Done FNP, Tori Milks    . Obesity   . Pelvic pain   . SKIN RASH 09/21/2007   Qualifier: Diagnosis of  By: Hassell Done FNP, Tori Milks    . Stress headaches   . Vitamin D deficiency     Past Surgical History:  Procedure Laterality Date  . APPENDECTOMY    . BREAST BIOPSY Right   . FOOT SURGERY Bilateral    Plantar fasciitis  . THYROIDECTOMY, PARTIAL     pt denies    There were no vitals filed for this visit.  Subjective Assessment - 12/30/18 0904    Subjective  Patient late today she reports having trouble with her disabled daughter this AM    Currently in Pain?  Yes    Pain Score  7     Pain Location  Back   neck                      [facility] Adult PT  Treatment/Exercise - 12/30/18 0001      Lumbar Exercises: Stretches   Passive Hamstring Stretch  Left;Right;4 reps;20 seconds    Piriformis Stretch  Right;Left;30 seconds;4 reps      Lumbar Exercises: Aerobic   UBE (Upper Arm Bike)  Level 1 x 4 minutes needs a lot of cues to relax her shoulders, tends to really elevate    Nustep  level 4 x 6 minutes some cues to move faster      Lumbar Exercises: Machines for Strengthening   Cybex Knee Extension  5# 2x10    Cybex Knee Flexion  15# 2x10      Lumbar Exercises: Supine   Bridge with Ball Squeeze  20 reps;1 second    Other Supine Lumbar Exercises  feet on ball K2C, trunk rotation, small bridges and isometric abs               PT Short Term Goals - 08/02/18 1656      PT SHORT TERM GOAL #1   Title  She will be independent with  intial HEP    Time  4    Period  Weeks    Status  Achieved      PT SHORT TERM GOAL #2   Title  Pt will report she is able to stand for at least 15 minutes without having to lie down    Baseline  10 min and will have increased pain    Time  4    Period  Weeks    Status  Not Met    Target Date  05/03/18        PT Long Term Goals - 12/27/18 0953      PT LONG TERM GOAL #1   Title  pt will be ind with advanced HEP    Status  On-going      PT LONG TERM GOAL #2   Title  Pt will be able to stand for 30 minutes so she can take a shower without having to lie down right afterwards due to improved pain and strength    Status  Partially Met            Plan - 12/30/18 0914    Clinical Impression Statement  Patient with a lot of gaurding especially in the upper traps tends to elevate the shoulders.  She reports pain with all activities, cues to get end ranges on exercises to get good mm activity    PT Next Visit Plan  Patient will need to try to progress with exercises    Consulted and Agree with Plan of Care  Patient       Patient will benefit from skilled therapeutic intervention in order to  improve the following deficits and impairments:  Abnormal gait, Pain, Decreased strength, Postural dysfunction, Decreased mobility, Cardiopulmonary status limiting activity, Decreased activity tolerance, Decreased endurance, Decreased range of motion, Difficulty walking, Impaired flexibility, Increased muscle spasms  Visit Diagnosis: Cervicalgia  Chronic bilateral low back pain without sciatica  Muscle weakness (generalized)     Problem List Patient Active Problem List   Diagnosis Date Noted  . Healthcare maintenance 10/08/2017  . Urge incontinence of urine 05/22/2017  . Fibromyalgia 10/25/2012  . LLQ abdominal pain 09/08/2012  . Oligomenorrhea 05/20/2011  . Vitamin D deficiency 02/15/2009  . Calcaneal spur of foot, right 08/22/2007  . Lower back pain 07/18/2007    [name], PT 12/30/2018, 9:27 AM  [place] [address] [place], [place], [address] Phone: [phone]   Fax:  [phone]  Name: Kristin Joseph MRN: [id number] Date of Birth: July 11, 1970

## 2019-01-02 ENCOUNTER — Other Ambulatory Visit: Payer: Self-pay

## 2019-01-02 ENCOUNTER — Encounter: Payer: Self-pay | Admitting: Physical Therapy

## 2019-01-02 ENCOUNTER — Ambulatory Visit: Payer: Medicaid Other | Admitting: Physical Therapy

## 2019-01-02 DIAGNOSIS — M542 Cervicalgia: Secondary | ICD-10-CM | POA: Diagnosis not present

## 2019-01-02 DIAGNOSIS — M545 Low back pain, unspecified: Secondary | ICD-10-CM

## 2019-01-02 DIAGNOSIS — M6281 Muscle weakness (generalized): Secondary | ICD-10-CM

## 2019-01-02 NOTE — Therapy (Signed)
[place], [place], [address] Phone: [phone]   Fax:  [phone]  Physical Therapy Treatment  Patient Details  Name: Kristin Joseph MRN: [id number] Date of Birth: 04-23-1970 Referring Provider (PT): Carylon Perches   Encounter Date: 01/02/2019  PT End of Session - 01/02/19 0928    Visit Number  8    Date for PT Re-Evaluation  01/15/19    Authorization Type  Medicaid     Activity Tolerance  Patient limited by pain    Behavior During Therapy  [facility] for tasks assessed/performed       Past Medical History:  Diagnosis Date  . 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 Back pain   . CALLUSES, FEET, BILATERAL 08/08/2007   Qualifier: Diagnosis of  By: Hassell Done FNP, Tori Milks    . Depression   . FATIGUE 04/18/2008   Qualifier: Diagnosis of  By: Hassell Done FNP, Tori Milks    . Fibromyalgia 10/25/2012  . Gastritis 06/28/2012   Mild  . HEADACHE 08/22/2007   Qualifier: Diagnosis of  By: Hassell Done FNP, Tori Milks    . Hyperglycemia 03/17/2017  . Lumbago 07/18/2007   Qualifier: Diagnosis of  By: Hassell Done FNP, Tori Milks    . Obesity   . Pelvic pain   . SKIN RASH 09/21/2007   Qualifier: Diagnosis of  By: Hassell Done FNP, Tori Milks    . Stress headaches   . Vitamin D deficiency     Past Surgical History:  Procedure Laterality Date  . APPENDECTOMY    . BREAST BIOPSY Right   . FOOT SURGERY Bilateral    Plantar fasciitis  . THYROIDECTOMY, PARTIAL     pt denies    There were no vitals filed for this visit.  Subjective Assessment - 01/02/19 0907    Subjective  Patient reports that she gets 2-3 days of relief with the treatments here    Currently in Pain?  Yes    Pain Score  7     Pain Location  Back   neck   Aggravating Factors   activity                       [facility] Adult PT Treatment/Exercise - 01/02/19 0001      Lumbar Exercises: Stretches   Passive Hamstring  Stretch  Left;Right;4 reps;20 seconds    Piriformis Stretch  Right;Left;30 seconds;4 reps      Lumbar Exercises: Aerobic   UBE (Upper Arm Bike)  Level 1 x 4 minutes needs a lot of cues to relax her shoulders, tends to really elevate    Nustep  level 4 x 6 minutes some cues to move faster      Lumbar Exercises: Machines for Strengthening   Cybex Knee Extension  5# 2x10    Cybex Knee Flexion  15# 2x10      Lumbar Exercises: Standing   Other Standing Lumbar Exercises  hip abduction and extension 2# 2x10 each      Lumbar Exercises: Supine   Bridge with [employer]  20 reps;1 second    Bridge with clamshell  15 reps;1 second    Bridge with [employer] Limitations  red tband    Other Supine Lumbar Exercises  feet on ball K2C, trunk rotation, small bridges and isometric abs      Manual Therapy   Manual Therapy  Soft tissue mobilization    Soft tissue  mobilization  to the bilatera lumbar paraspinals with her in sidelying               PT Short Term Goals - 08/02/18 1656      PT SHORT TERM GOAL #1   Title  She will be independent with intial HEP    Time  4    Period  Weeks    Status  Achieved      PT SHORT TERM GOAL #2   Title  Pt will report she is able to stand for at least 15 minutes without having to lie down    Baseline  10 min and will have increased pain    Time  4    Period  Weeks    Status  Not Met    Target Date  05/03/18        PT Long Term Goals - 01/02/19 0933      PT LONG TERM GOAL #2   Title  Pt will be able to stand for 30 minutes so she can take a shower without having to lie down right afterwards due to improved pain and strength    Status  On-going      PT LONG TERM GOAL #3   Title  increase cervical ROM 25%    Status  On-going            Plan - 01/02/19 0929    Clinical Impression Statement  Patient continues to have high ratings of pain, I have worked on explaining to her how important movements are to lessening pain, and how good  walking is for back issues, she reports that walking makes her back much worse and reports other activities will increaes the neck pain. She reports that walking > 200 feet she will start to have pain up to 9/10.  She is tight in the lumbar area and seems to always be gaurded, can relax at times with verbal and tactile cues    PT Next Visit Plan  REally encourage exercises    Consulted and Agree with Plan of Care  Patient       Patient will benefit from skilled therapeutic intervention in order to improve the following deficits and impairments:  Abnormal gait, Pain, Decreased strength, Postural dysfunction, Decreased mobility, Cardiopulmonary status limiting activity, Decreased activity tolerance, Decreased endurance, Decreased range of motion, Difficulty walking, Impaired flexibility, Increased muscle spasms  Visit Diagnosis: Cervicalgia  Chronic bilateral low back pain without sciatica  Muscle weakness (generalized)     Problem List Patient Active Problem List   Diagnosis Date Noted  . Healthcare maintenance 10/08/2017  . Urge incontinence of urine 05/22/2017  . Fibromyalgia 10/25/2012  . LLQ abdominal pain 09/08/2012  . Oligomenorrhea 05/20/2011  . Vitamin D deficiency 02/15/2009  . Calcaneal spur of foot, right 08/22/2007  . Lower back pain 07/18/2007    [name], PT 01/02/2019, 9:34 AM  [place] [address] [place], [place], [address] Phone: [phone]   Fax:  [phone]  Name: Kristin Joseph MRN: [id number] Date of Birth: 1970/04/24

## 2019-01-04 ENCOUNTER — Other Ambulatory Visit: Payer: Self-pay

## 2019-01-04 ENCOUNTER — Ambulatory Visit: Payer: Medicaid Other | Admitting: Physical Therapy

## 2019-01-04 ENCOUNTER — Encounter: Payer: Self-pay | Admitting: Physical Therapy

## 2019-01-04 DIAGNOSIS — M6281 Muscle weakness (generalized): Secondary | ICD-10-CM

## 2019-01-04 DIAGNOSIS — M542 Cervicalgia: Secondary | ICD-10-CM

## 2019-01-04 DIAGNOSIS — G8929 Other chronic pain: Secondary | ICD-10-CM
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
## 2019-01-10 ENCOUNTER — Ambulatory Visit: Payer: Medicaid Other | Admitting: Physical Therapy

## 2019-01-10 ENCOUNTER — Other Ambulatory Visit: Payer: Self-pay

## 2019-01-10 ENCOUNTER — Encounter: Payer: Self-pay | Admitting: Physical Therapy

## 2019-01-10 DIAGNOSIS — G8929 Other chronic pain: Secondary | ICD-10-CM

## 2019-01-10 DIAGNOSIS — M542 Cervicalgia: Secondary | ICD-10-CM | POA: Diagnosis not present

## 2019-01-10 DIAGNOSIS — M6281 Muscle weakness (generalized): Secondary | ICD-10-CM

## 2019-01-10 DIAGNOSIS — M545 Low back pain, unspecified: Secondary | ICD-10-CM

## 2019-01-10 NOTE — Therapy (Signed)
[facility] [address] [place], [place], [address] Phone: [phone]   Fax:  [phone]  Physical Therapy Treatment  Patient Details  Name: Kristin Joseph MRN: [id number] Date of Birth: 04-22-1970 Referring Provider (PT): Carylon Perches   Encounter Date: 01/10/2019  PT End of Session - 01/10/19 1153    Visit Number  10    Date for PT Re-Evaluation  01/15/19    Authorization Type  Medicaid     PT Start Time  1021    PT Stop Time  1104    PT Time Calculation (min)  43 min    Activity Tolerance  Patient limited by pain;Patient limited by fatigue       Past Medical History:  Diagnosis Date  . 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 Back pain   . CALLUSES, FEET, BILATERAL 08/08/2007   Qualifier: Diagnosis of  By: Hassell Done FNP, Tori Milks    . Depression   . FATIGUE 04/18/2008   Qualifier: Diagnosis of  By: Hassell Done FNP, Tori Milks    . Fibromyalgia 10/25/2012  . Gastritis 06/28/2012   Mild  . HEADACHE 08/22/2007   Qualifier: Diagnosis of  By: Hassell Done FNP, Tori Milks    . Hyperglycemia 03/17/2017  . Lumbago 07/18/2007   Qualifier: Diagnosis of  By: Hassell Done FNP, Tori Milks    . Obesity   . Pelvic pain   . SKIN RASH 09/21/2007   Qualifier: Diagnosis of  By: Hassell Done FNP, Tori Milks    . Stress headaches   . Vitamin D deficiency     Past Surgical History:  Procedure Laterality Date  . APPENDECTOMY    . BREAST BIOPSY Right   . FOOT SURGERY Bilateral    Plantar fasciitis  . THYROIDECTOMY, PARTIAL     pt denies    There were no vitals filed for this visit.  Subjective Assessment - 01/10/19 1021    Subjective  Patient a little late she is reporting that she is hurting, she reports she does well after the treatment for a few days    Currently in Pain?  Yes    Pain Score  8     Pain Location  Back   neck   Aggravating Factors   cold weather and activity    Pain Relieving Factors  the  heat really helps                       [facility] Adult PT Treatment/Exercise - 01/10/19 0001      Lumbar Exercises: Stretches   Passive Hamstring Stretch  Left;Right;5 reps;20 seconds    Lower Trunk Rotation  5 reps;20 seconds    Piriformis Stretch  Right;Left;5 reps;20 seconds      Lumbar Exercises: Aerobic   Nustep  level 4 x 6 minutes some cues to move faster      Lumbar Exercises: Machines for Strengthening   Cybex Knee Extension  5# 2x10    Cybex Knee Flexion  15# 2x10 tried to do 20# she reported more pain      Lumbar Exercises: Standing   Other Standing Lumbar Exercises  hip abduction and extension 2# 2x10 each      Lumbar Exercises: Seated   Long Arc Quad on Chair  Strengthening;Right;Left;15 reps;Weights;2 sets    LAQ on Chair Weights (lbs)  3    Hip Flexion on Ball Limitations  hip flexion alternating with 2# on  ankle 30 times with core bracing      Lumbar Exercises: Supine   Ab Set  10 reps;5 seconds    AB Set Limitations  a lot of verbal and tactile cues to get mms to engage    Pelvic Tilt  20 reps;5 seconds    Pelvic Tilt Limitations  a lot of cues verbal and tactile, patient with difficulty staying on task    Glut Set  1 second;15 reps    Clam  20 reps    Clam Limitations  red tband    Bridge  10 reps               PT Short Term Goals - 08/02/18 1656      PT SHORT TERM GOAL #1   Title  She will be independent with intial HEP    Time  4    Period  Weeks    Status  Achieved      PT SHORT TERM GOAL #2   Title  Pt will report she is able to stand for at least 15 minutes without having to lie down    Baseline  10 min and will have increased pain    Time  4    Period  Weeks    Status  Not Met    Target Date  05/03/18        PT Long Term Goals - 01/10/19 1200      PT LONG TERM GOAL #1   Title  pt will be ind with advanced HEP    Status  On-going      PT LONG TERM GOAL #2   Title  Pt will be able to stand for 30 minutes so she  can take a shower without having to lie down right afterwards due to improved pain and strength    Status  On-going      PT LONG TERM GOAL #3   Title  increase cervical ROM 25%    Status  Partially Met      PT LONG TERM GOAL #4   Title  increase lumbar ROM 50%    Status  Partially Met      PT LONG TERM GOAL #5   Title  Pt will report she is able to walk at least 10 minutes for return to exercise as par tof healthy lifestyle    Status  On-going            Plan - 01/10/19 1155    Clinical Impression Statement  Patient continues to tell me that the treatment helps her for days after.  She reports otherwise she is lying down due to the pain, I spoke with her about this and that her only being 48 years old and her life is lying down, that this is not good, tried to explain the need to try to increase activity and how this may help her tolerate actual life events and that lying around is a poor quality of life, She seems very focused on the pain and when I talk to her about quality of life and how to advance to ADL's she continues to only talk about her pain and how when she stands or walks she has to go lie down    PT Next Visit Plan  her medicaid runs out 01/15/19, will have to review before I try to renew    Consulted and Agree with Plan of Care  Patient       Patient will  benefit from skilled therapeutic intervention in order to improve the following deficits and impairments:  Abnormal gait, Pain, Decreased strength, Postural dysfunction, Decreased mobility, Cardiopulmonary status limiting activity, Decreased activity tolerance, Decreased endurance, Decreased range of motion, Difficulty walking, Impaired flexibility, Increased muscle spasms  Visit Diagnosis: Cervicalgia  Chronic bilateral low back pain without sciatica  Muscle weakness (generalized)     Problem List Patient Active Problem List   Diagnosis Date Noted  . Healthcare maintenance 10/08/2017  . Urge incontinence  of urine 05/22/2017  . Fibromyalgia 10/25/2012  . LLQ abdominal pain 09/08/2012  . Oligomenorrhea 05/20/2011  . Vitamin D deficiency 02/15/2009  . Calcaneal spur of foot, right 08/22/2007  . Lower back pain 07/18/2007    [name], PT 01/10/2019, 12:01 PM  [address] [place], [place], [address] Phone: [phone]   Fax:  [phone]  Name: Kristin Joseph MRN: [id number] Date of Birth: 1970/12/17

## 2019-04-23 ENCOUNTER — Emergency Department (HOSPITAL_COMMUNITY)
Admission: EM | Admit: 2019-04-23 | Discharge: 2019-04-23 | Disposition: A | Discharge status: Home / Self Care | Payer: Medicaid Other | Attending: Emergency Medicine | Admitting: Emergency Medicine

## 2019-04-23 ENCOUNTER — Emergency Department (HOSPITAL_COMMUNITY): Payer: Medicaid Other

## 2019-04-23 ENCOUNTER — Other Ambulatory Visit: Payer: Self-pay

## 2019-04-23 ENCOUNTER — Encounter (HOSPITAL_COMMUNITY): Payer: Self-pay

## 2019-04-23 DIAGNOSIS — M549 Dorsalgia, unspecified: Secondary | ICD-10-CM | POA: Diagnosis present

## 2019-04-23 DIAGNOSIS — R109 Unspecified abdominal pain: Secondary | ICD-10-CM | POA: Insufficient documentation

## 2019-04-23 DIAGNOSIS — M5441 Lumbago with sciatica, right side: Secondary | ICD-10-CM | POA: Diagnosis not present

## 2019-04-23 DIAGNOSIS — G8929 Other chronic pain: Secondary | ICD-10-CM

## 2019-04-23 DIAGNOSIS — R509 Fever, unspecified: Secondary | ICD-10-CM | POA: Insufficient documentation

## 2019-04-23 LAB — CBC
HCT: 37.3 % (ref 36.0–46.0)
Hemoglobin: 11.6 g/dL — ABNORMAL LOW (ref 12.0–15.0)
MCH: 29.4 pg (ref 26.0–34.0)
MCHC: 31.1 g/dL (ref 30.0–36.0)
MCV: 94.4 fL (ref 80.0–100.0)
Platelets: 246 10*3/uL (ref 150–400)
RBC: 3.95 MIL/uL (ref 3.87–5.11)
RDW: 13.1 % (ref 11.5–15.5)
WBC: 5.1 10*3/uL (ref 4.0–10.5)
nRBC: 0 % (ref 0.0–0.2)

## 2019-04-23 LAB — I-STAT BETA HCG BLOOD, ED (MC, WL, AP ONLY): I-stat hCG, quantitative: <5 m[IU]/mL (ref <5)

## 2019-04-23 LAB — COMPREHENSIVE METABOLIC PANEL
ALT: 18 U/L (ref 0–44)
AST: 19 U/L (ref 15–41)
Albumin: 3.6 g/dL (ref 3.5–5.0)
Alkaline Phosphatase: 55 U/L (ref 38–126)
Anion gap: 11 (ref 5–15)
BUN: 13 mg/dL (ref 6–20)
CO2: 24 mmol/L (ref 22–32)
Calcium: 9.5 mg/dL (ref 8.9–10.3)
Chloride: 107 mmol/L (ref 98–111)
Creatinine, Ser: 0.5 mg/dL (ref 0.44–1.00)
GFR calc Af Amer: >60 mL/min (ref >60)
GFR calc non Af Amer: >60 mL/min (ref >60)
Glucose, Bld: 120 mg/dL — ABNORMAL HIGH (ref 70–99)
Potassium: 4.1 mmol/L (ref 3.5–5.1)
Sodium: 142 mmol/L (ref 135–145)
Total Bilirubin: 0.7 mg/dL (ref 0.3–1.2)
Total Protein: 7 g/dL (ref 6.5–8.1)

## 2019-04-23 LAB — URINALYSIS, ROUTINE W REFLEX MICROSCOPIC
Bilirubin Urine: NEGATIVE
Glucose, UA: NEGATIVE mg/dL
Hgb urine dipstick: NEGATIVE
Ketones, ur: NEGATIVE mg/dL
Leukocytes,Ua: NEGATIVE
Nitrite: NEGATIVE
Protein, ur: NEGATIVE mg/dL
Specific Gravity, Urine: 1.005 (ref 1.005–1.030)
pH: 6 (ref 5.0–8.0)

## 2019-04-23 LAB — LACTIC ACID, PLASMA: Lactic Acid, Venous: 1.5 mmol/L (ref 0.5–1.9)

## 2019-04-23 MED ORDER — KETOROLAC TROMETHAMINE 30 MG/ML IJ SOLN
30.0000 mg | Freq: Once | INTRAMUSCULAR | Status: AC
  Administered 2019-04-23: 30 mg via INTRAVENOUS
  Filled 2019-04-23: qty 1

## 2019-04-23 MED ORDER — SODIUM CHLORIDE 0.9 % IV BOLUS
1000.0000 mL | Freq: Once | INTRAVENOUS | Status: AC
  Administered 2019-04-23: 1000 mL via INTRAVENOUS

## 2019-04-23 NOTE — Discharge Instructions (Addendum)
Please continue your home medications.  You can use heat and cold therapy to affected area.  Please call your doctor for recheck this week.
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
## 2019-04-23 NOTE — ED Notes (Signed)
Patient verbalizes understanding of discharge instructions. Opportunity for questioning and answers were provided. Armband removed by staff, pt discharged from ED ambulatory.   
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
## 2019-04-28 LAB — CULTURE, BLOOD (ROUTINE X 2)
Culture: NO GROWTH
Culture: NO GROWTH
Special Requests: ADEQUATE
Special Requests: ADEQUATE

## 2019-05-08 ENCOUNTER — Ambulatory Visit: Payer: Medicaid Other | Admitting: Physical Therapy

## 2019-05-15 ENCOUNTER — Ambulatory Visit: Payer: Medicaid Other | Attending: Orthopedic Surgery | Admitting: Physical Therapy

## 2019-05-15 ENCOUNTER — Encounter: Payer: Self-pay | Admitting: Physical Therapy

## 2019-05-15 ENCOUNTER — Other Ambulatory Visit: Payer: Self-pay

## 2019-05-15 DIAGNOSIS — M545 Low back pain: Secondary | ICD-10-CM | POA: Diagnosis present

## 2019-05-15 DIAGNOSIS — M542 Cervicalgia: Secondary | ICD-10-CM | POA: Diagnosis not present

## 2019-05-15 DIAGNOSIS — R262 Difficulty in walking, not elsewhere classified: Secondary | ICD-10-CM | POA: Insufficient documentation

## 2019-05-15 DIAGNOSIS — G8929 Other chronic pain: Secondary | ICD-10-CM | POA: Insufficient documentation

## 2019-05-15 DIAGNOSIS — M6281 Muscle weakness (generalized): Secondary | ICD-10-CM | POA: Insufficient documentation

## 2019-05-15 DIAGNOSIS — M62830 Muscle spasm of back: Secondary | ICD-10-CM | POA: Insufficient documentation
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
## 2019-05-24 ENCOUNTER — Ambulatory Visit: Payer: Medicaid Other | Admitting: Physical Therapy

## 2019-05-31 ENCOUNTER — Ambulatory Visit: Payer: Medicaid Other | Admitting: Physical Therapy

## 2019-06-01 ENCOUNTER — Ambulatory Visit: Payer: Medicaid Other | Admitting: Podiatry

## 2019-06-06 DIAGNOSIS — Z0289 Encounter for other administrative examinations: Secondary | ICD-10-CM

## 2019-06-08 ENCOUNTER — Ambulatory Visit: Payer: Medicaid Other | Admitting: Physical Therapy

## 2019-07-04 ENCOUNTER — Ambulatory Visit: Payer: Medicaid Other | Admitting: Sports Medicine

## 2019-07-07 ENCOUNTER — Encounter: Payer: Self-pay | Admitting: *Deleted

## 2019-07-11 ENCOUNTER — Ambulatory Visit: Payer: Medicaid Other | Attending: Urology | Admitting: Physical Therapy

## 2019-07-11 ENCOUNTER — Other Ambulatory Visit: Payer: Self-pay

## 2019-07-11 DIAGNOSIS — M6281 Muscle weakness (generalized): Secondary | ICD-10-CM | POA: Diagnosis present

## 2019-07-11 DIAGNOSIS — R279 Unspecified lack of coordination: Secondary | ICD-10-CM | POA: Diagnosis present

## 2019-07-11 DIAGNOSIS — R293 Abnormal posture: Secondary | ICD-10-CM | POA: Insufficient documentation
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
## 2019-08-08 ENCOUNTER — Ambulatory Visit: Payer: Medicaid Other | Admitting: Sports Medicine

## 2019-08-09 ENCOUNTER — Ambulatory Visit: Payer: Medicaid Other | Admitting: Physical Therapy

## 2019-08-15 ENCOUNTER — Ambulatory Visit: Payer: Medicaid Other | Admitting: Physical Therapy

## 2019-08-22 ENCOUNTER — Encounter: Payer: Medicaid Other | Admitting: Physical Therapy

## 2019-09-06 ENCOUNTER — Ambulatory Visit: Payer: Medicaid Other | Admitting: Physical Therapy

## 2019-09-13 ENCOUNTER — Encounter: Payer: Medicaid Other | Admitting: Physical Therapy

## 2019-09-18 ENCOUNTER — Encounter: Payer: Medicaid Other | Admitting: Physical Therapy

## 2019-09-27 ENCOUNTER — Encounter: Payer: Medicaid Other | Admitting: Physical Therapy

## 2019-10-03 ENCOUNTER — Ambulatory Visit: Payer: Medicaid Other | Admitting: Physical Therapy

## 2021-04-15 ENCOUNTER — Ambulatory Visit (HOSPITAL_COMMUNITY)
Admission: EM | Admit: 2021-04-15 | Discharge: 2021-04-15 | Disposition: A | Discharge status: Home / Self Care | Payer: Medicaid Other | Attending: Family Medicine | Admitting: Family Medicine

## 2021-04-15 ENCOUNTER — Encounter (HOSPITAL_COMMUNITY): Payer: Self-pay

## 2021-04-15 DIAGNOSIS — R32 Unspecified urinary incontinence: Secondary | ICD-10-CM | POA: Insufficient documentation

## 2021-04-15 DIAGNOSIS — Z113 Encounter for screening for infections with a predominantly sexual mode of transmission: Secondary | ICD-10-CM | POA: Insufficient documentation

## 2021-04-15 LAB — POCT URINALYSIS DIPSTICK, ED / UC
Bilirubin Urine: NEGATIVE
Glucose, UA: NEGATIVE mg/dL
Hgb urine dipstick: NEGATIVE
Ketones, ur: NEGATIVE mg/dL
Leukocytes,Ua: NEGATIVE
Nitrite: NEGATIVE
Protein, ur: NEGATIVE mg/dL
Specific Gravity, Urine: 1.01 (ref 1.005–1.030)
Urobilinogen, UA: 0.2 mg/dL (ref 0.0–1.0)
pH: 7 (ref 5.0–8.0)

## 2021-04-15 MED ORDER — OXYBUTYNIN CHLORIDE 5 MG PO TABS: 5.0000 mg | ORAL_TABLET | Freq: Two times a day (BID) | ORAL | 2 refills | Status: AC

## 2021-04-15 MED ORDER — METHOCARBAMOL 500 MG PO TABS: 500.0000 mg | ORAL_TABLET | Freq: Two times a day (BID) | ORAL | 0 refills | Status: AC | PRN

## 2021-04-15 NOTE — ED Triage Notes (Addendum)
Pt c/o having incontinence x2wks, with lt flank/lower back pain. States going through 18 pampers a day. ?

## 2021-04-15 NOTE — Discharge Instructions (Addendum)
Your urinalysis is clear; urine culture is sent, and staff will call you if it is positive and you need antibiotics. ? ?Take oxybutynin 5 mg twice daily for incontinence.  If this is not helping you within a week of taking it then stop it ?Take methocarbamol 500 mg--1 tablet twice daily as needed for muscle spasms. ? ?Please follow-up with your primary doctor ? ?Screening for sexually transmitted diseases includes gonorrhea, chlamydia, and trichomonas test ?
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
## 2021-04-16 LAB — CERVICOVAGINAL ANCILLARY ONLY
Bacterial Vaginitis (gardnerella): POSITIVE — AB
Candida Glabrata: NEGATIVE
Candida Vaginitis: NEGATIVE
Chlamydia: NEGATIVE
Comment: NEGATIVE
Comment: NEGATIVE
Comment: NEGATIVE
Comment: NEGATIVE
Comment: NEGATIVE
Comment: NORMAL
Neisseria Gonorrhea: NEGATIVE
Trichomonas: NEGATIVE

## 2021-04-17 ENCOUNTER — Telehealth (HOSPITAL_COMMUNITY): Payer: Self-pay | Admitting: Emergency Medicine

## 2021-04-17 LAB — URINE CULTURE

## 2021-04-17 MED ORDER — METRONIDAZOLE 500 MG PO TABS: 500.0000 mg | ORAL_TABLET | Freq: Two times a day (BID) | ORAL | 0 refills | Status: DC

## 2021-09-28 ENCOUNTER — Ambulatory Visit (INDEPENDENT_AMBULATORY_CARE_PROVIDER_SITE_OTHER): Payer: Medicaid Other

## 2021-09-28 ENCOUNTER — Encounter (HOSPITAL_COMMUNITY): Payer: Self-pay | Admitting: *Deleted

## 2021-09-28 ENCOUNTER — Ambulatory Visit (HOSPITAL_COMMUNITY)
Admission: EM | Admit: 2021-09-28 | Discharge: 2021-09-28 | Disposition: A | Discharge status: Home / Self Care | Payer: Medicaid Other | Attending: Family Medicine | Admitting: Family Medicine

## 2021-09-28 DIAGNOSIS — M25511 Pain in right shoulder: Secondary | ICD-10-CM | POA: Diagnosis not present

## 2021-09-28 DIAGNOSIS — M25512 Pain in left shoulder: Secondary | ICD-10-CM

## 2021-09-28 DIAGNOSIS — G8929 Other chronic pain: Secondary | ICD-10-CM

## 2021-09-28 MED ORDER — DICLOFENAC SODIUM 1 % EX GEL: 2.0000 g | Freq: Four times a day (QID) | CUTANEOUS | 1 refills | Status: AC

## 2021-09-28 NOTE — ED Triage Notes (Signed)
Pt c/o constant bilat shoulder pain onset 1 yr ago; over past month pain has increased, R>L. Pt states she is having difficulty holding objects, even a book due to shoulder pain. C/O difficulty with ROM BUE at shoulders. Pt appears to boost her body back on the exam table using BUE without difficulty; carries moderate sized pocketbook.

## 2021-09-28 NOTE — Discharge Instructions (Addendum)
Staff will notify you if there is any serious pathology on your x-ray  Diclofenac gel is now over-the-counter--I have sent a prescription for it per your request however  Apply 2 g topically to the affected area where you are having pain up to 4 times daily as needed

## 2021-09-28 NOTE — ED Provider Notes (Signed)
[place]    CSN: [id number] Arrival date & time: 09/28/21  1600      History   Chief Complaint Chief Complaint  Patient presents with   Shoulder Pain    HPI Kristin Joseph is a 52 y.o. female.    Shoulder Pain  Here for bilateral shoulder pain, and has been going on for about a year.  In the last 2 months her right shoulder is worsened and she states that sometimes it is hard to grasp things.   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   Qualifier: Diagnosis of  By: Hassell Done FNP, [place]kedtra     Back pain    CALLUSES, FEET, BILATERAL 08/08/2007   Qualifier: Diagnosis of  By: Hassell Done FNP, Nykedtra     Depression    FATIGUE 04/18/2008   Qualifier: Diagnosis of  By: Hassell Done FNP, Nykedtra     Fibromyalgia 10/25/2012   Gastritis 06/28/2012   Mild   HEADACHE 08/22/2007   Qualifier: Diagnosis of  By: Hassell Done FNP, Nykedtra     Hyperglycemia 03/17/2017   Lumbago 07/18/2007   Qualifier: Diagnosis of  By: Hassell Done FNP, Nykedtra     Obesity    Pelvic pain    SKIN RASH 09/21/2007   Qualifier: Diagnosis of  By: Hassell Done FNP, Tori Milks     Stress headaches    Vitamin D deficiency     Patient Active Problem List   Diagnosis Date Noted   Healthcare maintenance 10/08/2017   Urge incontinence of urine 05/22/2017   Fibromyalgia 10/25/2012   LLQ abdominal pain 09/08/2012   Oligomenorrhea 05/20/2011   Vitamin D deficiency 02/15/2009   Calcaneal spur of foot, right 08/22/2007   Lower back pain 07/18/2007    Past Surgical History:  Procedure Laterality Date   APPENDECTOMY     BREAST BIOPSY Right    FOOT SURGERY Bilateral    Plantar fasciitis   THYROIDECTOMY, PARTIAL     pt denies    OB History     Gravida  4   Para  3   Term  3   Preterm      AB  1   Living  3      SAB  1   IAB      Ectopic      Multiple      Live Births               Home Medications    Prior to Admission medications   Medication Sig  Start Date End Date Taking? Authorizing Provider  Cyanocobalamin (B-12) 2500 MCG TABS Take 2,500 mcg by mouth daily.   Yes [provider]  diclofenac Sodium (VOLTAREN) 1 % GEL Apply 2 g topically 4 (four) times daily. To affected area as needed for pain. 09/28/21  Yes Ruthel Martine, Gwenlyn Perking, MD  DULoxetine (CYMBALTA) 60 MG capsule TAKE 1 CAPSULE(60 MG) BY MOUTH DAILY 07/19/17  Yes Chundi, Vahini, MD  methocarbamol (ROBAXIN) 500 MG tablet Take 1 tablet (500 mg total) by mouth every 12 (twelve) hours as needed for muscle spasms. 04/15/21  Yes Barrett Henle, MD  oxybutynin (DITROPAN) 5 MG tablet Take 1 tablet (5 mg total) by mouth 2 (two) times daily. 04/15/21  Yes Barrett Henle, MD  gabapentin (NEURONTIN) 300 MG capsule Take 2 capsules (600 mg total) by mouth 2 (two) times daily for 30 days. 03/25/18 04/24/18  Lars Mage, MD  Simethicone (GAS-X PO) Take by mouth  daily as needed.    [provider]    Family History Family History  Problem Relation Age of Onset   Depression Mother    Colon cancer Neg Hx     Social History Social History   Tobacco Use   Smoking status: Never   Smokeless tobacco: Never  Vaping Use   Vaping Use: Never used  Substance Use Topics   Alcohol use: No   Drug use: No     Allergies   Gabapentin and Lyrica [pregabalin]   Review of Systems Review of Systems   Physical Exam Triage Vital Signs ED Triage Vitals  Enc Vitals Group     BP 09/28/21 1638 133/80     Pulse Rate 09/28/21 1638 (!) 114     Resp --      Temp 09/28/21 1638 98.4 F (36.9 C)     Temp Source 09/28/21 1638 Oral     SpO2 09/28/21 1638 97 %     Weight --      Height --      Head Circumference --      Peak Flow --      Pain Score 09/28/21 1641 9     Pain Loc --      Pain Edu? --      Excl. in [place]? --    No data found.  Updated Vital Signs BP 133/80   Pulse (!) 114   Temp 98.4 F (36.9 C) (Oral)   LMP 09/13/2016 (Approximate)   SpO2 97%   Visual  Acuity Right Eye Distance:   Left Eye Distance:   Bilateral Distance:    Right Eye Near:   Left Eye Near:    Bilateral Near:     Physical Exam Vitals reviewed.  Constitutional:      General: She is not in acute distress.    Appearance: She is not ill-appearing, toxic-appearing or diaphoretic.  Cardiovascular:     Rate and Rhythm: Normal rate and regular rhythm.  Musculoskeletal:        General: No swelling or tenderness.     Comments: Range of motion of the right shoulder is limited by pain.  There is no rash  Skin:    Coloration: Skin is not jaundiced or pale.  Neurological:     General: No focal deficit present.     Mental Status: She is alert and oriented to person, place, and time.  Psychiatric:        Behavior: Behavior normal.      UC Treatments / Results  Labs (all labs ordered are listed, but only abnormal results are displayed) Labs Reviewed - No data to display  EKG   Radiology No results found.  Procedures Procedures (including critical care time)  Medications Ordered in UC Medications - No data to display  Initial Impression / Assessment and Plan / UC Course  I have reviewed the triage vital signs and the nursing notes.  Pertinent labs & imaging results that were available during my care of the patient were reviewed by me and considered in my medical decision making (see chart for details).     After x-ray was ordered, the patient call me back in the room to request a cream or ointment for her pain.  She states she is tried something over-the-counter and it helped a lot, but she would like it to be prescription so it is less expensive for her  I discussed with her that diclofenac is no longer prescription and  there is no prescription medicine that is a topical that I would use otherwise.  She then requested to leave after x-ray was done because she has a special needs child at home and she has been informed that she needs to leave on an urgent  basis.  Staff will notify her of any significant abnormality on her x-ray Gone ahead and sent a prescription for diclofenac so that if it is covered by Medicaid she can pick it up  Final Clinical Impressions(s) / UC Diagnoses   Final diagnoses:  Chronic pain of both shoulders     Discharge Instructions      Staff will notify you if there is any serious pathology on your x-ray  Diclofenac gel is now over-the-counter--I have sent a prescription for it per your request however  Apply 2 g topically to the affected area where you are having pain up to 4 times daily as needed       ED Prescriptions     Medication Sig Dispense Auth. Provider   diclofenac Sodium (VOLTAREN) 1 % GEL Apply 2 g topically 4 (four) times daily. To affected area as needed for pain. 100 g Barrett Henle, MD      PDMP not reviewed this encounter.   Barrett Henle, MD 09/28/21 [id number]

## 2022-07-09 ENCOUNTER — Other Ambulatory Visit: Payer: Self-pay | Admitting: Endocrinology

## 2022-07-09 DIAGNOSIS — Z1231 Encounter for screening mammogram for malignant neoplasm of breast: Secondary | ICD-10-CM

## 2022-07-10 ENCOUNTER — Ambulatory Visit: Payer: Medicaid Other

## 2022-07-13 ENCOUNTER — Ambulatory Visit
Admission: RE | Admit: 2022-07-13 | Discharge: 2022-07-13 | Disposition: A | Discharge status: Home / Self Care | Payer: Medicaid Other | Source: Ambulatory Visit | Attending: Endocrinology | Admitting: Endocrinology

## 2022-07-13 DIAGNOSIS — Z1231 Encounter for screening mammogram for malignant neoplasm of breast: Secondary | ICD-10-CM
